# Patient Record
Sex: Female | Born: 1995
Health system: Southern US, Community
[De-identification: ages and names within clinical notes are randomized; demographics above are authoritative.]

## PROBLEM LIST (undated history)

## (undated) ENCOUNTER — Inpatient Hospital Stay (HOSPITAL_COMMUNITY): Payer: Self-pay

## (undated) DIAGNOSIS — Z5189 Encounter for other specified aftercare: Secondary | ICD-10-CM

## (undated) DIAGNOSIS — N390 Urinary tract infection, site not specified: Secondary | ICD-10-CM

## (undated) DIAGNOSIS — K219 Gastro-esophageal reflux disease without esophagitis: Secondary | ICD-10-CM

## (undated) DIAGNOSIS — I10 Essential (primary) hypertension: Secondary | ICD-10-CM

## (undated) DIAGNOSIS — O139 Gestational [pregnancy-induced] hypertension without significant proteinuria, unspecified trimester: Secondary | ICD-10-CM

## (undated) DIAGNOSIS — D649 Anemia, unspecified: Secondary | ICD-10-CM

## (undated) DIAGNOSIS — R7989 Other specified abnormal findings of blood chemistry: Secondary | ICD-10-CM

## (undated) HISTORY — DX: Gastro-esophageal reflux disease without esophagitis: K21.9

## (undated) HISTORY — PX: DILATION AND CURETTAGE OF UTERUS: SHX78

## (undated) HISTORY — DX: Essential (primary) hypertension: I10

## (undated) HISTORY — DX: Other specified abnormal findings of blood chemistry: R79.89

## (undated) HISTORY — PX: NO PAST SURGERIES: SHX2092

## (undated) HISTORY — DX: Anemia, unspecified: D64.9

---
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
## 2017-06-19 IMAGING — DX DG KNEE COMPLETE 4+V*L*
1 series · 1 of 1 positions shown · non-contrast
Comparison: None.

CLINICAL DATA: Cheerleading injury with lateral joint pain, initial
encounter

EXAM:
LEFT KNEE - COMPLETE 4+ VIEW

[knee lat]
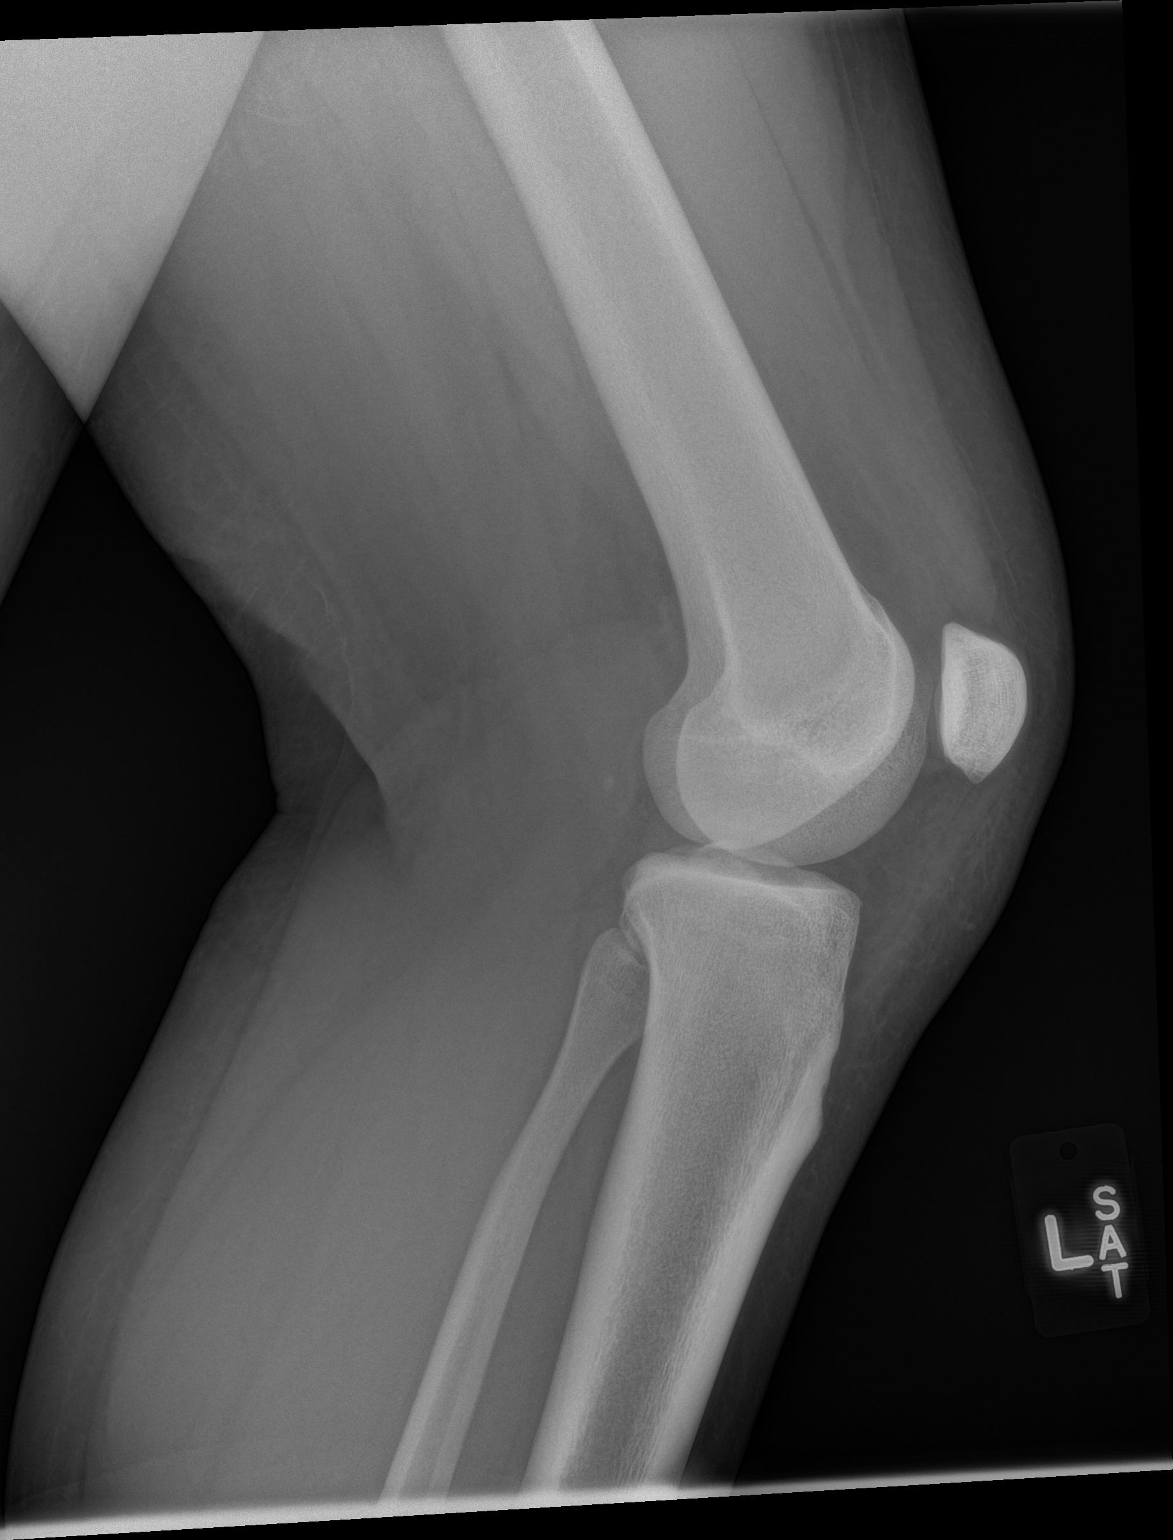

[1 of 1 positions shown; findings below may reference images not displayed]

FINDINGS: No acute fracture or dislocation is noted. Soft tissue swelling is
noted as well as a small joint effusion.
IMPRESSION: Soft tissue injury without acute bony abnormality.

## 2017-10-20 ENCOUNTER — Encounter: Payer: Self-pay | Admitting: Obstetrics

## 2017-10-20 ENCOUNTER — Ambulatory Visit (INDEPENDENT_AMBULATORY_CARE_PROVIDER_SITE_OTHER): Payer: BLUE CROSS/BLUE SHIELD | Admitting: Obstetrics

## 2017-10-20 ENCOUNTER — Other Ambulatory Visit (HOSPITAL_COMMUNITY)
Admission: RE | Admit: 2017-10-20 | Discharge: 2017-10-20 | Disposition: A | Discharge status: Home / Self Care | Payer: BLUE CROSS/BLUE SHIELD | Source: Ambulatory Visit | Attending: Obstetrics | Admitting: Obstetrics

## 2017-10-20 VITALS — BP 148/101 | HR 84 | Ht 63.0 in | Wt 231.0 lb

## 2017-10-20 DIAGNOSIS — Z3009 Encounter for other general counseling and advice on contraception: Secondary | ICD-10-CM

## 2017-10-20 DIAGNOSIS — Z113 Encounter for screening for infections with a predominantly sexual mode of transmission: Secondary | ICD-10-CM

## 2017-10-20 DIAGNOSIS — B373 Candidiasis of vulva and vagina: Secondary | ICD-10-CM | POA: Diagnosis not present

## 2017-10-20 DIAGNOSIS — Z6841 Body Mass Index (BMI) 40.0 and over, adult: Secondary | ICD-10-CM

## 2017-10-20 DIAGNOSIS — I10 Essential (primary) hypertension: Secondary | ICD-10-CM

## 2017-10-20 NOTE — Progress Notes (Signed)
LMP:09/28/17 Mammogram:N/A due to age   Last Pap:N/A due to age  Colonoscopy:N/A due to age   Contraception:Nexplanon  [facility]  STD Screening: Declines  CC: heavy bleeding w/nexplanon.

## 2017-10-21 ENCOUNTER — Encounter: Payer: Self-pay | Admitting: Obstetrics

## 2017-10-21 LAB — CERVICOVAGINAL ANCILLARY ONLY
Bacterial vaginitis: NEGATIVE
Candida vaginitis: POSITIVE — AB
Chlamydia: NEGATIVE
Neisseria Gonorrhea: NEGATIVE
Trichomonas: NEGATIVE

## 2017-10-21 NOTE — Progress Notes (Signed)
Subjective:    Bonnie Long is a 22 y.o. female who presents for contraception counseling. The patient has no complaints today. The patient is sexually active. Pertinent past medical history: none.  The information documented in the HPI was reviewed and verified.  Menstrual History: OB History    Gravida Para Term Preterm AB Living   0 0 0 0 0 0   SAB TAB Ectopic Multiple Live Births   0 0 0 0 0       No LMP recorded.   There are no active problems to display for this patient.  History reviewed. No pertinent past medical history.  History reviewed. No pertinent surgical history.   Current Outpatient Medications:  .  etonogestrel (NEXPLANON) 68 MG IMPL implant, 1 each by Subdermal route once., Disp: , Rfl:  No Known Allergies  Social History   Tobacco Use  . Smoking status: Never Smoker  . Smokeless tobacco: Never Used  Substance Use Topics  . Alcohol use: No    Family History  Problem Relation Age of Onset  . Pulmonary embolism Mother   . Hypertension Maternal Grandfather        Review of Systems Constitutional: negative for weight loss Genitourinary:negative for abnormal menstrual periods and vaginal discharge   Objective:   BP (!) 148/101   Pulse 84   Ht 5\' 3"  (1.6 m)   Wt 231 lb (104.8 kg)   BMI 40.92 kg/m    General:   alert  Skin:   no rash or abnormalities  Lungs:   clear to auscultation bilaterally  Heart:   regular rate and rhythm, S1, S2 normal, no murmur, click, rub or gallop  Breasts:   normal without suspicious masses, skin or nipple changes or axillary nodes  Abdomen:  normal findings: no organomegaly, soft, non-tender and no hernia  Pelvis:  External genitalia: normal general appearance Urinary system: urethral meatus normal and bladder without fullness, nontender Vaginal: normal without tenderness, induration or masses Cervix: normal appearance Adnexa: normal bimanual exam Uterus: anteverted and non-tender, normal size   Lab  Review Urine pregnancy test Labs reviewed yes Radiologic studies reviewed no  50% of 15 min visit spent on counseling and coordination of care.    Assessment:    [age] y.o., continuing Nexplanon, no contraindications.  Must get BP under control.  1. HTN (hypertension), benign Rx: - Ambulatory referral to Internal Medicine  2. Class 3 severe obesity due to excess calories without serious comorbidity with body mass index (BMI) of 40.0 to 44.9 in adult [facility](HCC) Rx: - Ambulatory referral to Internal Medicine  3. Screening for STD (sexually transmitted disease) Rx: - Cervicovaginal ancillary only  Plan:    All questions answered. Contraception: Nexplanon. Diagnosis explained in detail, including differential. Discussed healthy lifestyle modifications. Follow up in 3 months.   No orders of the defined types were placed in this encounter.  Orders Placed This Encounter  Procedures  . Ambulatory referral to Internal Medicine    Referral Priority:   Routine    Referral Type:   Consultation    Referral Reason:   Specialty Services Required    Requested Specialty:   Internal Medicine    Number of Visits Requested:   1   Need to obtain previous records Follow up as needed.    Brock BadHARLES A. Crewe Heathman MD

## 2017-10-22 ENCOUNTER — Other Ambulatory Visit: Payer: Self-pay | Admitting: Obstetrics

## 2017-10-22 DIAGNOSIS — B373 Candidiasis of vulva and vagina: Secondary | ICD-10-CM

## 2017-10-22 DIAGNOSIS — B3731 Acute candidiasis of vulva and vagina: Secondary | ICD-10-CM

## 2017-10-22 MED ORDER — FLUCONAZOLE 150 MG PO TABS: 150.0000 mg | ORAL_TABLET | Freq: Once | ORAL | 0 refills | Status: AC

## 2018-01-14 ENCOUNTER — Other Ambulatory Visit: Payer: Self-pay | Admitting: Obstetrics

## 2018-01-14 DIAGNOSIS — N3000 Acute cystitis without hematuria: Secondary | ICD-10-CM

## 2018-01-14 MED ORDER — NITROFURANTOIN MONOHYD MACRO 100 MG PO CAPS: 100.0000 mg | ORAL_CAPSULE | Freq: Two times a day (BID) | ORAL | 0 refills | Status: DC

## 2018-01-21 ENCOUNTER — Ambulatory Visit (INDEPENDENT_AMBULATORY_CARE_PROVIDER_SITE_OTHER): Payer: BLUE CROSS/BLUE SHIELD | Admitting: Nurse Practitioner

## 2018-01-21 ENCOUNTER — Other Ambulatory Visit (INDEPENDENT_AMBULATORY_CARE_PROVIDER_SITE_OTHER): Payer: BLUE CROSS/BLUE SHIELD

## 2018-01-21 ENCOUNTER — Encounter: Payer: Self-pay | Admitting: Nurse Practitioner

## 2018-01-21 ENCOUNTER — Encounter

## 2018-01-21 VITALS — BP 142/98 | HR 74 | Temp 99.5°F | Resp 16 | Ht 63.0 in | Wt 223.0 lb

## 2018-01-21 DIAGNOSIS — E669 Obesity, unspecified: Secondary | ICD-10-CM | POA: Insufficient documentation

## 2018-01-21 DIAGNOSIS — R030 Elevated blood-pressure reading, without diagnosis of hypertension: Secondary | ICD-10-CM

## 2018-01-21 DIAGNOSIS — Z0001 Encounter for general adult medical examination with abnormal findings: Secondary | ICD-10-CM | POA: Insufficient documentation

## 2018-01-21 DIAGNOSIS — Z0000 Encounter for general adult medical examination without abnormal findings: Secondary | ICD-10-CM | POA: Insufficient documentation

## 2018-01-21 LAB — BASIC METABOLIC PANEL
BUN: 10 mg/dL (ref 6–23)
CALCIUM: 9.3 mg/dL (ref 8.4–10.5)
CO2: 28 meq/L (ref 19–32)
Chloride: 104 mEq/L (ref 96–112)
Creatinine, Ser: 1.03 mg/dL (ref 0.40–1.20)
GFR: 87.59 mL/min (ref >60.00)
GLUCOSE: 86 mg/dL (ref 70–99)
POTASSIUM: 3.9 meq/L (ref 3.5–5.1)
SODIUM: 137 meq/L (ref 135–145)

## 2018-01-21 LAB — CBC
HCT: 39.4 % (ref 36.0–46.0)
HEMOGLOBIN: 13.1 g/dL (ref 12.0–15.0)
MCHC: 33.4 g/dL (ref 30.0–36.0)
MCV: 82.4 fl (ref 78.0–100.0)
PLATELETS: 336 10*3/uL (ref 150.0–400.0)
RBC: 4.78 Mil/uL (ref 3.87–5.11)
RDW: 13.9 % (ref 11.5–14.6)
WBC: 6.2 10*3/uL (ref 4.5–10.5)

## 2018-01-21 LAB — LIPID PANEL
CHOL/HDL RATIO: 4
Cholesterol: 146 mg/dL (ref 0–200)
HDL: 38.8 mg/dL — AB (ref >39.00)
LDL Cholesterol: 86 mg/dL (ref 0–99)
NONHDL: 107.58
Triglycerides: 106 mg/dL (ref 0.0–149.0)
VLDL: 21.2 mg/dL (ref 0.0–40.0)

## 2018-01-21 LAB — TSH: TSH: 1.07 u[IU]/mL (ref 0.35–5.50)

## 2018-01-21 LAB — HEMOGLOBIN A1C: Hgb A1c MFr Bld: 5.5 % (ref 4.6–6.5)

## 2018-01-21 NOTE — Assessment & Plan Note (Signed)
-  USPSTF grade A and B recommendations reviewed with patient; age-appropriate recommendations, preventive care, screening tests, etc discussed and encouraged; healthy living encouraged; see AVS for patient education given to patient -Discussed importance of 150 minutes of physical activity weekly, eat one serving of tree nuts ( cashews, pistachios, pecans, almonds.Marland Kitchen.) every other day, eat 6 servings of fruit/vegetables daily and drink plenty of water and avoid sweet beverages. Always wear sunscreen when outside > 10 minutes.  -Reviewed Health Maintenance: declines HIV screening, otherwise up to date

## 2018-01-21 NOTE — Progress Notes (Signed)
Name: Bonnie Long   MRN: [id number]    DOB: September 27, 1996   Date:01/21/2018       Progress Note  Subjective  Chief Complaint  Chief Complaint  Patient presents with  . Establish Care    CPE, not fasting    HPI  Patient presents for annual CPE. She is accompanied by her grandmother today. She is in school studying nursing  Diet: poor diet - skips meals, mostly eats meals out Exercise: no routine exercise  USPSTF grade A and B recommendations  Depression:  Depression screen [facility] 2/9 01/21/2018  Decreased Interest 0  Down, Depressed, Hopeless 0  PHQ - 2 Score 0   Hypertension: She was told by her GYN recently that her blood pressure was high and asked to establish with PCP for further evaluation She denies past history of HTN She checks her blood pressure about twice a week and gets readings of 120s/80s She feels like her blood pressure is going up because she is coming to the clinic and she is nervous BP Readings from Last 3 Encounters:  01/21/18 (!) 142/98  10/20/17 (!) 148/101  05/21/15 126/90   Obesity: Wt Readings from Last 3 Encounters:  01/21/18 223 lb (101.2 kg)  10/20/17 231 lb (104.8 kg)  03/26/12 193 lb 4 oz (87.7 kg) (98 %, Z= 2.17)*   * Growth percentiles are based on CDC (Girls, 2-20 Years) data.   BMI Readings from Last 3 Encounters:  01/21/18 39.50 kg/m  10/20/17 40.92 kg/m  03/26/12 30.27 kg/m (97 %, Z= 1.92)*   * Growth percentiles are based on CDC (Girls, 2-20 Years) data.    Alcohol: no  Tobacco use: no, never HIV: declines screening STD testing and prevention (chl/gon/syphilis): declines, no concerns Intimate partner violence:feels safe at home Menstrual History/Abnormal Bleeding: follows GYN for womens healthcare needs, no concerns today Incontinence Symptoms:denies symptoms  Vaccinations: up to date  Advanced Care Planning: A voluntary discussion about advance care planning including the explanation and discussion of advance  directives.  Discussed health care proxy and Living will, and the patient DOES NOT have a living will at present time. If patient does have living will, I have requested they bring this to the clinic to be scanned in to their chart.  Lipids:  No results found for: CHOL No results found for: HDL No results found for: LDLCALC No results found for: TRIG No results found for: CHOLHDL No results found for: LDLDIRECT  Glucose:  No results found for: GLUCOSE, GLUCAP  Skin cancer screening: no concerning lesions or moles, no routine sunscreen use  Aspirin: not indicated ECG: not indicated   Family History  Problem Relation Age of Onset  . Pulmonary embolism Mother   . Hypertension Maternal Grandfather     Social History   Socioeconomic History  . Marital status: Single    Spouse name: Not on file  . Number of children: Not on file  . Years of education: Not on file  . Highest education level: Not on file  Occupational History  . Not on file  Social Needs  . Financial resource strain: Not on file  . Food insecurity:    Worry: Not on file    Inability: Not on file  . Transportation needs:    Medical: Not on file    Non-medical: Not on file  Tobacco Use  . Smoking status: Never Smoker  . Smokeless tobacco: Never Used  Substance and Sexual Activity  . Alcohol use: No  .  Drug use: No  . Sexual activity: Not Currently    Partners: Male    Birth control/protection: Implant  Lifestyle  . Physical activity:    Days per week: Not on file    Minutes per session: Not on file  . Stress: Not on file  Relationships  . Social connections:    Talks on phone: Not on file    Gets together: Not on file    Attends religious service: Not on file    Active member of club or organization: Not on file    Attends meetings of clubs or organizations: Not on file    Relationship status: Not on file  . Intimate partner violence:    Fear of current or ex partner: Not on file     Emotionally abused: Not on file    Physically abused: Not on file    Forced sexual activity: Not on file  Other Topics Concern  . Not on file  Social History Narrative  . Not on file     Current Outpatient Medications:  .  etonogestrel (NEXPLANON) 68 MG IMPL implant, 1 each by Subdermal route once., Disp: , Rfl:   No Known Allergies   ROS  Constitutional: Negative for fever or weight change.  Respiratory: Negative for cough and shortness of breath.   Cardiovascular: Negative for chest pain or palpitations.  Gastrointestinal: Negative for abdominal pain, no bowel changes.  Musculoskeletal: Negative for gait problem or joint swelling.  Skin: Negative for rash.  Neurological: Negative for dizziness or headache.  No other specific complaints in a complete review of systems (except as listed in HPI above).   Objective  Vitals:   01/21/18 1305  BP: (!) 142/98  Pulse: 74  Resp: 16  Temp: 99.5 F (37.5 C)  TempSrc: Oral  SpO2: 98%  Weight: 223 lb (101.2 kg)  Height: 5\' 3"  (1.6 m)    Body mass index is 39.5 kg/m.  Physical Exam Vital signs reviewed. Constitutional: Patient appears well-developed and well-nourished. No distress.  HENT: Head: Normocephalic and atraumatic. Ears: B TMs ok, no erythema or effusion, scant cerumen bilaterally; Nose: Nose normal. Mouth/Throat: Oropharynx is clear and moist. No oropharyngeal exudate.  Eyes: Conjunctivae and EOM are normal. Pupils are equal, round, and reactive to light. No scleral icterus.  Neck: Normal range of motion. Neck supple. No JVD present. No thyromegaly present.  Cardiovascular: Normal rate, regular rhythm and normal heart sounds.  No murmur heard. No BLE edema. Pulmonary/Chest: Effort normal and breath sounds normal. No respiratory distress. Abdominal: Soft. Bowel sounds are normal, no distension. There is no tenderness. no masses Musculoskeletal: Normal range of motion, no joint effusions. No gross  deformities Neurological: he is alert and oriented to person, place, and time. No cranial nerve deficit. Coordination, balance, strength, speech and gait are normal.  Skin: Skin is warm and dry. No rash noted. No erythema.  Psychiatric: Patient has a normal mood and affect. behavior is normal. Judgment and thought content normal.  PHQ2/9: Depression screen PHQ 2/9 01/21/2018  Decreased Interest 0  Down, Depressed, Hopeless 0  PHQ - 2 Score 0   Fall Risk: Fall Risk  01/21/2018  Falls in the past year? No   Assessment & Plan RTC in 2 weeks for F/U: HTN  Obesity (BMI 30-39.9) Discussed the role of healthy diet and exercise in the management of weight and encouarged patient to make active lifestyle changes to prevent development of chronic health problems - Hemoglobin A1c; Future - Lipid  panel; Future - TSH; Future  Elevated blood pressure reading Elevated reading in the clinic with reported normal readings at home Instructed to keep BP log at home and RTC in 2 weeks for F/U Discussed the role of healthy diet and exercise in the management of hypertension and encouraged patient to make active lifestyle changes to prevent development of chronic health problems - Lipid panel; Future - CBC; Future - Basic metabolic panel; Future - TSH; Future

## 2018-01-21 NOTE — Patient Instructions (Signed)
Please try to check your blood pressure once daily or at least a few times a week, at the same time each day, and keep a log. Please return in about 2 weeks with your blood pressure log. Our goal is to keep your blood pressure below 140/90.  Please head downstairs for lab work/x-rays. If any of your test results are critically abnormal, you will be contacted right away. Your results may be released to your MyChart for viewing before I am able to provide you with my response. I will contact you within a week about your test results and any recommendations for abnormalities.  Please work on your diet and exercise as we discussed. Remember half of your plate should be veggies, one-fourth carbs, one-fourth meat, and don't eat meat at every meal. Also, remember to stay away from sugary drinks. I'd like for you to start incorporating exercise into your daily schedule. Start at 10 minutes a day, working up to 30 minutes five times a week.   I will see you in 2 weeks!  Health Maintenance, Female Adopting a healthy lifestyle and getting preventive care can go a long way to promote health and wellness. Talk with your health care provider about what schedule of regular examinations is right for you. This is a good chance for you to check in with your provider about disease prevention and staying healthy. In between checkups, there are plenty of things you can do on your own. Experts have done a lot of research about which lifestyle changes and preventive measures are most likely to keep you healthy. Ask your health care provider for more information. Weight and diet Eat a healthy diet  Be sure to include plenty of vegetables, fruits, low-fat dairy products, and lean protein.  Do not eat a lot of foods high in solid fats, added sugars, or salt.  Get regular exercise. This is one of the most important things you can do for your health. ? Most adults should exercise for at least 150 minutes each week. The  exercise should increase your heart rate and make you sweat (moderate-intensity exercise). ? Most adults should also do strengthening exercises at least twice a week. This is in addition to the moderate-intensity exercise.  Maintain a healthy weight  Body mass index (BMI) is a measurement that can be used to identify possible weight problems. It estimates body fat based on height and weight. Your health care provider can help determine your BMI and help you achieve or maintain a healthy weight.  For females 75 years of age and older: ? A BMI below 18.5 is considered underweight. ? A BMI of 18.5 to 24.9 is normal. ? A BMI of 25 to 29.9 is considered overweight. ? A BMI of 30 and above is considered obese.  Watch levels of cholesterol and blood lipids  You should start having your blood tested for lipids and cholesterol at 20 years of age, then have this test every 5 years.  You may need to have your cholesterol levels checked more often if: ? Your lipid or cholesterol levels are high. ? You are older than 20 years of age. ? You are at high risk for heart disease.  Cancer screening Lung Cancer  Lung cancer screening is recommended for adults 72-66 years old who are at high risk for lung cancer because of a history of smoking.  A yearly low-dose CT scan of the lungs is recommended for people who: ? Currently smoke. ? Have quit within  the past 15 years. ? Have at least a 30-pack-year history of smoking. A pack year is smoking an average of one pack of cigarettes a day for 1 year.  Yearly screening should continue until it has been 15 years since you quit.  Yearly screening should stop if you develop a health problem that would prevent you from having lung cancer treatment.  Breast Cancer  Practice breast self-awareness. This means understanding how your breasts normally appear and feel.  It also means doing regular breast self-exams. Let your health care provider know about any  changes, no matter how small.  If you are in your 20s or 30s, you should have a clinical breast exam (CBE) by a health care provider every 1-3 years as part of a regular health exam.  If you are 75 or older, have a CBE every year. Also consider having a breast X-ray (mammogram) every year.  If you have a family history of breast cancer, talk to your health care provider about genetic screening.  If you are at high risk for breast cancer, talk to your health care provider about having an MRI and a mammogram every year.  Breast cancer gene (BRCA) assessment is recommended for women who have family members with BRCA-related cancers. BRCA-related cancers include: ? Breast. ? Ovarian. ? Tubal. ? Peritoneal cancers.  Results of the assessment will determine the need for genetic counseling and BRCA1 and BRCA2 testing.  Cervical Cancer Your health care provider may recommend that you be screened regularly for cancer of the pelvic organs (ovaries, uterus, and vagina). This screening involves a pelvic examination, including checking for microscopic changes to the surface of your cervix (Pap test). You may be encouraged to have this screening done every 3 years, beginning at age 24.  For women ages 40-65, health care providers may recommend pelvic exams and Pap testing every 3 years, or they may recommend the Pap and pelvic exam, combined with testing for human papilloma virus (HPV), every 5 years. Some types of HPV increase your risk of cervical cancer. Testing for HPV may also be done on women of any age with unclear Pap test results.  Other health care providers may not recommend any screening for nonpregnant women who are considered low risk for pelvic cancer and who do not have symptoms. Ask your health care provider if a screening pelvic exam is right for you.  If you have had past treatment for cervical cancer or a condition that could lead to cancer, you need Pap tests and screening for cancer  for at least 20 years after your treatment. If Pap tests have been discontinued, your risk factors (such as having a new sexual partner) need to be reassessed to determine if screening should resume. Some women have medical problems that increase the chance of getting cervical cancer. In these cases, your health care provider may recommend more frequent screening and Pap tests.  Colorectal Cancer  This type of cancer can be detected and often prevented.  Routine colorectal cancer screening usually begins at 20 years of age and continues through 20 years of age.  Your health care provider may recommend screening at an earlier age if you have risk factors for colon cancer.  Your health care provider may also recommend using home test kits to check for hidden blood in the stool.  A small camera at the end of a tube can be used to examine your colon directly (sigmoidoscopy or colonoscopy). This is done to check for  the earliest forms of colorectal cancer.  Routine screening usually begins at age 32.  Direct examination of the colon should be repeated every 5-10 years through 20 years of age. However, you may need to be screened more often if early forms of precancerous polyps or small growths are found.  Skin Cancer  Check your skin from head to toe regularly.  Tell your health care provider about any new moles or changes in moles, especially if there is a change in a mole's shape or color.  Also tell your health care provider if you have a mole that is larger than the size of a pencil eraser.  Always use sunscreen. Apply sunscreen liberally and repeatedly throughout the day.  Protect yourself by wearing long sleeves, pants, a wide-brimmed hat, and sunglasses whenever you are outside.  Heart disease, diabetes, and high blood pressure  High blood pressure causes heart disease and increases the risk of stroke. High blood pressure is more likely to develop in: ? People who have blood  pressure in the high end of the normal range (130-139/85-89 mm Hg). ? People who are overweight or obese. ? People who are African American.  If you are 17-32 years of age, have your blood pressure checked every 3-5 years. If you are 13 years of age or older, have your blood pressure checked every year. You should have your blood pressure measured twice-once when you are at a hospital or clinic, and once when you are not at a hospital or clinic. Record the average of the two measurements. To check your blood pressure when you are not at a hospital or clinic, you can use: ? An automated blood pressure machine at a pharmacy. ? A home blood pressure monitor.  If you are between 81 years and 29 years old, ask your health care provider if you should take aspirin to prevent strokes.  Have regular diabetes screenings. This involves taking a blood sample to check your fasting blood sugar level. ? If you are at a normal weight and have a low risk for diabetes, have this test once every three years after 20 years of age. ? If you are overweight and have a high risk for diabetes, consider being tested at a younger age or more often. Preventing infection Hepatitis B  If you have a higher risk for hepatitis B, you should be screened for this virus. You are considered at high risk for hepatitis B if: ? You were born in a country where hepatitis B is common. Ask your health care provider which countries are considered high risk. ? Your parents were born in a high-risk country, and you have not been immunized against hepatitis B (hepatitis B vaccine). ? You have HIV or AIDS. ? You use needles to inject street drugs. ? You live with someone who has hepatitis B. ? You have had sex with someone who has hepatitis B. ? You get hemodialysis treatment. ? You take certain medicines for conditions, including cancer, organ transplantation, and autoimmune conditions.  Hepatitis C  Blood testing is recommended  for: ? Everyone born from [birth year] through 1963. ? Anyone with known risk factors for hepatitis C.  Sexually transmitted infections (STIs)  You should be screened for sexually transmitted infections (STIs) including gonorrhea and chlamydia if: ? You are sexually active and are younger than 20 years of age. ? You are older than 20 years of age and your health care provider tells you that you are at risk for this  type of infection. ? Your sexual activity has changed since you were last screened and you are at an increased risk for chlamydia or gonorrhea. Ask your health care provider if you are at risk.  If you do not have HIV, but are at risk, it may be recommended that you take a prescription medicine daily to prevent HIV infection. This is called pre-exposure prophylaxis (PrEP). You are considered at risk if: ? You are sexually active and do not regularly use condoms or know the HIV status of your partner(s). ? You take drugs by injection. ? You are sexually active with a partner who has HIV.  Talk with your health care provider about whether you are at high risk of being infected with HIV. If you choose to begin PrEP, you should first be tested for HIV. You should then be tested every 3 months for as long as you are taking PrEP. Pregnancy  If you are premenopausal and you may become pregnant, ask your health care provider about preconception counseling.  If you may become pregnant, take 400 to 800 micrograms (mcg) of folic acid every day.  If you want to prevent pregnancy, talk to your health care provider about birth control (contraception). Osteoporosis and menopause  Osteoporosis is a disease in which the bones lose minerals and strength with aging. This can result in serious bone fractures. Your risk for osteoporosis can be identified using a bone density scan.  If you are 4 years of age or older, or if you are at risk for osteoporosis and fractures, ask your health care provider if  you should be screened.  Ask your health care provider whether you should take a calcium or vitamin D supplement to lower your risk for osteoporosis.  Menopause may have certain physical symptoms and risks.  Hormone replacement therapy may reduce some of these symptoms and risks. Talk to your health care provider about whether hormone replacement therapy is right for you. Follow these instructions at home:  Schedule regular health, dental, and eye exams.  Stay current with your immunizations.  Do not use any tobacco products including cigarettes, chewing tobacco, or electronic cigarettes.  If you are pregnant, do not drink alcohol.  If you are breastfeeding, limit how much and how often you drink alcohol.  Limit alcohol intake to no more than 1 drink per day for nonpregnant women. One drink equals 12 ounces of beer, 5 ounces of wine, or 1 ounces of hard liquor.  Do not use street drugs.  Do not share needles.  Ask your health care provider for help if you need support or information about quitting drugs.  Tell your health care provider if you often feel depressed.  Tell your health care provider if you have ever been abused or do not feel safe at home. This information is not intended to replace advice given to you by your health care provider. Make sure you discuss any questions you have with your health care provider. Document Released: 04/01/2011 Document Revised: 02/22/2016 Document Reviewed: 06/20/2015 Elsevier Interactive Patient Education  [employer].

## 2018-02-11 ENCOUNTER — Ambulatory Visit (INDEPENDENT_AMBULATORY_CARE_PROVIDER_SITE_OTHER): Payer: BLUE CROSS/BLUE SHIELD | Admitting: Nurse Practitioner

## 2018-02-11 ENCOUNTER — Encounter: Payer: Self-pay | Admitting: Nurse Practitioner

## 2018-02-11 VITALS — BP 142/90 | HR 70 | Resp 16 | Ht 63.0 in | Wt 222.4 lb

## 2018-02-11 DIAGNOSIS — I10 Essential (primary) hypertension: Secondary | ICD-10-CM | POA: Diagnosis not present

## 2018-02-11 DIAGNOSIS — E669 Obesity, unspecified: Secondary | ICD-10-CM | POA: Diagnosis not present

## 2018-02-11 MED ORDER — AMLODIPINE BESYLATE 5 MG PO TABS: 5.0000 mg | ORAL_TABLET | Freq: Every day | ORAL | 1 refills | Status: DC

## 2018-02-11 NOTE — Assessment & Plan Note (Addendum)
BP readings elevated on 2 OV and per home log today We discussed starting medication to treat HTN and she Is agreeable, will start amlodipine- dosing and side effects discussed Continue to monitor BP readings at home She is very young and I encouraged her to continue healthy diet and exercise to improve BP and promote life long health RTC in 2 weeks for F/U - amLODipine (NORVASC) 5 MG tablet; Take 1 tablet (5 mg total) by mouth daily.  Dispense: 30 tablet; Refill: 1

## 2018-02-11 NOTE — Progress Notes (Signed)
Name: Bonnie Long   MRN: [id number]    DOB: May 26, 1996   Date:02/11/2018       Progress Note  Subjective  Chief Complaint  Chief Complaint  Patient presents with  . Follow-up    blood pressure    HPI Here today with family member for a BP follow up. She established with me for CPE on 4/24 and her BP was elevated. We discussed working on healthy diet and exercise and keeping BP log at home with plan to f/u in about 2 weeks.  She has been keeping BP log as instructed- daily home readings since our last visit have ranged from 130s-200s/90s. She started exercising about 2 weeks ago, running on treadmill and elliptical. She has not really changed her diet much, but has been trying to cook at home some. Denies headaches, vision changes, chest pain, shortness of breath, edema. Overall she feels well and does not have any complaints  BP Readings from Last 3 Encounters:  02/11/18 (!) 142/90  01/21/18 (!) 142/98  10/20/17 (!) 148/101    Patient Active Problem List   Diagnosis Date Noted  . Encounter for general adult medical examination with abnormal findings 01/21/2018  . Obesity (BMI 30-39.9) 01/21/2018    No past surgical history on file.  Family History  Problem Relation Age of Onset  . Pulmonary embolism Mother   . Hypertension Maternal Grandfather     Social History   Socioeconomic History  . Marital status: Single    Spouse name: Not on file  . Number of children: Not on file  . Years of education: Not on file  . Highest education level: Not on file  Occupational History  . Not on file  Social Needs  . Financial resource strain: Not on file  . Food insecurity:    Worry: Not on file    Inability: Not on file  . Transportation needs:    Medical: Not on file    Non-medical: Not on file  Tobacco Use  . Smoking status: Never Smoker  . Smokeless tobacco: Never Used  Substance and Sexual Activity  . Alcohol use: No  . Drug use: No  . Sexual activity: Not  Currently    Partners: Male    Birth control/protection: Implant  Lifestyle  . Physical activity:    Days per week: Not on file    Minutes per session: Not on file  . Stress: Not on file  Relationships  . Social connections:    Talks on phone: Not on file    Gets together: Not on file    Attends religious service: Not on file    Active member of club or organization: Not on file    Attends meetings of clubs or organizations: Not on file    Relationship status: Not on file  . Intimate partner violence:    Fear of current or ex partner: Not on file    Emotionally abused: Not on file    Physically abused: Not on file    Forced sexual activity: Not on file  Other Topics Concern  . Not on file  Social History Narrative  . Not on file     Current Outpatient Medications:  .  etonogestrel (NEXPLANON) 68 MG IMPL implant, 1 each by Subdermal route once., Disp: , Rfl:   No Known Allergies   ROS See HPI  Objective  Vitals:   02/11/18 1530  BP: (!) 142/90  Pulse: 70  Resp: 16  SpO2: 99%  Weight: 222 lb 6.4 oz (100.9 kg)  Height:  (1.6 m)    Body mass index is 39.4 kg/m.  Physical Exam Vital signs reviewed. Constitutional: Patient appears well-developed and well-nourished. No distress.  HENT: Head: Normocephalic and atraumatic. Nose: Nose normal. Mouth/Throat: Oropharynx is clear and moist. No oropharyngeal exudate.  Eyes: Conjunctivae and EOM are normal. Pupils are equal, round, and reactive to light. No scleral icterus.  Neck: Normal range of motion. Neck supple.  Cardiovascular: Normal rate, regular rhythm and normal heart sounds.  No murmur heard. No BLE edema. Pulmonary/Chest: Effort normal and breath sounds normal. No respiratory distress. Neurological: She is alert and oriented to person, place, and time. Coordination, balance, strength, speech and gait are normal.  Skin: Skin is warm and dry. No rash noted. No erythema.  Psychiatric: Patient has a normal  mood and affect. behavior is normal. Judgment and thought content normal.  Assessment & Plan RTC in 2 weeks for F/U: HTN- starting amlodipine

## 2018-02-11 NOTE — Assessment & Plan Note (Addendum)
Discussed the role of healthy diet and exercise in the management of obesity and hypertension, she is interested in weight management. Will place referral today and also provided educational information on AVS - Amb Ref to Medical Weight Management

## 2018-02-11 NOTE — Patient Instructions (Signed)
I have sent a prescription for amlodipine 5 mg once daily for your blood pressure. Please try to check your blood pressure once daily or at least a few times a week, at the same time each day, and keep a log. Our goal is to get your blood pressure below 140/90. Bring your blood pressure monitor with you to next visit and we can make sure its accurate.  I have placed a referral to weight managment. Our office will call you to schedule this appointment. You should hear from our office in 7-10 days.  please return in about 2 weeks    Mediterranean Diet A Mediterranean diet refers to food and lifestyle choices that are based on the traditions of countries located on the [employer] [place]. This way of eating has been shown to help prevent certain conditions and improve outcomes for people who have chronic diseases, like kidney disease and heart disease. What are tips for following this plan? Lifestyle  Cook and eat meals together with your family, when possible.  Drink enough fluid to keep your urine clear or pale yellow.  Be physically active every day. This includes: ? Aerobic exercise like running or swimming. ? Leisure activities like gardening, walking, or housework.  Get 7-8 hours of sleep each night.  If recommended by your health care provider, drink red wine in moderation. This means 1 glass a day for nonpregnant women and 2 glasses a day for men. A glass of wine equals 5 oz (150 mL). Reading food labels  Check the serving size of packaged foods. For foods such as rice and pasta, the serving size refers to the amount of cooked product, not dry.  Check the total fat in packaged foods. Avoid foods that have saturated fat or trans fats.  Check the ingredients list for added sugars, such as corn syrup. Shopping  At the grocery store, buy most of your food from the areas near the walls of the store. This includes: ? Fresh fruits and vegetables (produce). ? Grains, beans, nuts, and  seeds. Some of these may be available in unpackaged forms or large amounts (in bulk). ? Fresh seafood. ? Poultry and eggs. ? Low-fat dairy products.  Buy whole ingredients instead of prepackaged foods.  Buy fresh fruits and vegetables in-season from local farmers markets.  Buy frozen fruits and vegetables in resealable bags.  If you do not have access to quality fresh seafood, buy precooked frozen shrimp or canned fish, such as tuna, salmon, or sardines.  Buy small amounts of raw or cooked vegetables, salads, or olives from the deli or salad bar at your store.  Stock your pantry so you always have certain foods on hand, such as olive oil, canned tuna, canned tomatoes, rice, pasta, and beans. Cooking  Cook foods with extra-virgin olive oil instead of using butter or other vegetable oils.  Have meat as a side dish, and have vegetables or grains as your main dish. This means having meat in small portions or adding small amounts of meat to foods like pasta or stew.  Use beans or vegetables instead of meat in common dishes like chili or lasagna.  Experiment with different cooking methods. Try roasting or broiling vegetables instead of steaming or sauteing them.  Add frozen vegetables to soups, stews, pasta, or rice.  Add nuts or seeds for added healthy fat at each meal. You can add these to yogurt, salads, or vegetable dishes.  Marinate fish or vegetables using olive oil, lemon juice, garlic, and  fresh herbs. Meal planning  Plan to eat 1 vegetarian meal one day each week. Try to work up to 2 vegetarian meals, if possible.  Eat seafood 2 or more times a week.  Have healthy snacks readily available, such as: ? Vegetable sticks with hummus. ? [place] yogurt. ? Fruit and nut trail mix.  Eat balanced meals throughout the week. This includes: ? Fruit: 2-3 servings a day ? Vegetables: 4-5 servings a day ? Low-fat dairy: 2 servings a day ? Fish, poultry, or lean meat: 1 serving a  day ? Beans and legumes: 2 or more servings a week ? Nuts and seeds: 1-2 servings a day ? Whole grains: 6-8 servings a day ? Extra-virgin olive oil: 3-4 servings a day  Limit red meat and sweets to only a few servings a month What are my food choices?  Mediterranean diet ? Recommended ? Grains: Whole-grain pasta. Brown rice. Bulgar wheat. Polenta. Couscous. Whole-wheat bread. [place]. ? Vegetables: Artichokes. Beets. Broccoli. Cabbage. Carrots. Eggplant. Green beans. Chard. Kale. Spinach. Onions. Leeks. Peas. Squash. Tomatoes. Peppers. Radishes. ? Fruits: Apples. Apricots. Avocado. Berries. Bananas. Cherries. Dates. Figs. Grapes. Lemons. Melon. Oranges. Peaches. Plums. Pomegranate. ? Meats and other protein foods: Beans. Almonds. Sunflower seeds. Pine nuts. Peanuts. [place]. Salmon. Scallops. Shrimp. [place]. Tilapia. Clams. Oysters. Eggs. ? Dairy: Low-fat milk. Cheese. Greek yogurt. ? Beverages: Water. Red wine. Herbal tea. ? Fats and oils: Extra virgin olive oil. Avocado oil. Grape seed oil. ? Sweets and desserts: [place] yogurt with honey. Baked apples. Poached pears. Trail mix. ? Seasoning and other foods: Basil. Cilantro. Coriander. Cumin. Mint. Parsley. Sage. Rosemary. Tarragon. Garlic. Oregano. Thyme. Pepper. Balsalmic vinegar. Tahini. Hummus. Tomato sauce. Olives. Mushrooms. ? Limit these ? Grains: Prepackaged pasta or rice dishes. Prepackaged cereal with added sugar. ? Vegetables: Deep fried potatoes (french fries). ? Fruits: Fruit canned in syrup. ? Meats and other protein foods: Beef. Pork. Lamb. Poultry with skin. Hot dogs. [place] Salines. ? Dairy: Ice cream. Sour cream. Whole milk. ? Beverages: Juice. Sugar-sweetened soft drinks. Beer. Liquor and spirits. ? Fats and oils: Butter. Canola oil. Vegetable oil. Beef fat (tallow). Lard. ? Sweets and desserts: Cookies. Cakes. Pies. Candy. ? Seasoning and other foods: Mayonnaise. Premade sauces and marinades. ? The items listed may not be a  complete list. Talk with your dietitian about what dietary choices are right for you. Summary  The Mediterranean diet includes both food and lifestyle choices.  Eat a variety of fresh fruits and vegetables, beans, nuts, seeds, and whole grains.  Limit the amount of red meat and sweets that you eat.  Talk with your health care provider about whether it is safe for you to drink red wine in moderation. This means 1 glass a day for nonpregnant women and 2 glasses a day for men. A glass of wine equals 5 oz (150 mL). This information is not intended to replace advice given to you by your health care provider. Make sure you discuss any questions you have with your health care provider. Document Released: 05/09/2016 Document Revised: 06/11/2016 Document Reviewed: 05/09/2016 Elsevier Interactive Patient Education  [employer].

## 2018-02-26 ENCOUNTER — Encounter: Payer: Self-pay | Admitting: Nurse Practitioner

## 2018-02-26 ENCOUNTER — Ambulatory Visit (INDEPENDENT_AMBULATORY_CARE_PROVIDER_SITE_OTHER): Payer: BLUE CROSS/BLUE SHIELD | Admitting: Nurse Practitioner

## 2018-02-26 DIAGNOSIS — I10 Essential (primary) hypertension: Secondary | ICD-10-CM

## 2018-02-26 MED ORDER — AMLODIPINE BESYLATE 10 MG PO TABS: 10.0000 mg | ORAL_TABLET | Freq: Every day | ORAL | 1 refills | Status: DC

## 2018-02-26 NOTE — Progress Notes (Signed)
Name: Bonnie Long   MRN: [id number]    DOB: 1995-11-17   Date:02/26/2018       Progress Note  Subjective  Chief Complaint  Chief Complaint  Patient presents with  . Follow-up    blood pressure    HPI  Hypertension -started on amlodipine 5 daily at last OV due to repeated elevated BP readings. We have also spent time discussing healthy diet and exercise at last 2 OV. She tells me today that she has been taking the amlodipine daily as prescribed without any noted adverse effects. She has also been trying to exercise three times a week and cook more meals at home instead of eating out Reports she has been checking at home almost daily with recent readings upper 130s/80s Denies headaches, vision changes, chest pain, shortness of breath, edema.  BP Readings from Last 3 Encounters:  02/26/18 (!) 140/96  02/11/18 (!) 142/90  01/21/18 (!) 142/98    Patient Active Problem List   Diagnosis Date Noted  . Hypertension 02/11/2018  . Encounter for general adult medical examination with abnormal findings 01/21/2018  . Obesity (BMI 30-39.9) 01/21/2018    No past surgical history on file.  Family History  Problem Relation Age of Onset  . Pulmonary embolism Mother   . Hypertension Maternal Grandfather     Social History   Socioeconomic History  . Marital status: Single    Spouse name: Not on file  . Number of children: Not on file  . Years of education: Not on file  . Highest education level: Not on file  Occupational History  . Not on file  Social Needs  . Financial resource strain: Not on file  . Food insecurity:    Worry: Not on file    Inability: Not on file  . Transportation needs:    Medical: Not on file    Non-medical: Not on file  Tobacco Use  . Smoking status: Never Smoker  . Smokeless tobacco: Never Used  Substance and Sexual Activity  . Alcohol use: No  . Drug use: No  . Sexual activity: Not Currently    Partners: Male    Birth control/protection:  Implant  Lifestyle  . Physical activity:    Days per week: Not on file    Minutes per session: Not on file  . Stress: Not on file  Relationships  . Social connections:    Talks on phone: Not on file    Gets together: Not on file    Attends religious service: Not on file    Active member of club or organization: Not on file    Attends meetings of clubs or organizations: Not on file    Relationship status: Not on file  . Intimate partner violence:    Fear of current or ex partner: Not on file    Emotionally abused: Not on file    Physically abused: Not on file    Forced sexual activity: Not on file  Other Topics Concern  . Not on file  Social History Narrative  . Not on file     Current Outpatient Medications:  .  amLODipine (NORVASC) 5 MG tablet, Take 1 tablet (5 mg total) by mouth daily., Disp: 30 tablet, Rfl: 1 .  etonogestrel (NEXPLANON) 68 MG IMPL implant, 1 each by Subdermal route once., Disp: , Rfl:   No Known Allergies   ROS See HPI  Objective  Vitals:   02/26/18 1110  BP: (!) 140/96  Pulse: 78  Resp: 16  Temp: 99.4 F (37.4 C)  TempSrc: Oral  SpO2: 98%  Weight: 216 lb (98 kg)  Height:  (1.6 m)    Body mass index is 38.26 kg/m.  Physical Exam Vital signs reviewed. Constitutional: Patient appears well-developed and well-nourished. No distress.  HENT: Head: Normocephalic and atraumatic. Nose: Nose normal. Mouth/Throat: Oropharynx is clear and moist. No oropharyngeal exudate.  Eyes: Conjunctivae and EOM are normal. Pupils are equal, round, and reactive to light. No scleral icterus.  Neck: Normal range of motion. Neck supple.  Cardiovascular: Normal rate, regular rhythm and normal heart sounds. No murmur heard.No BLE edema. Distal pulses intact Pulmonary/Chest: Effort normal and breath sounds normal. No respiratory distress. Neurological: She is alert and oriented to person, place, and time. Coordination, balance, strength, speech and gait are  normal.  Skin: Skin is warm and dry. No rash noted. No erythema.  Psychiatric: Patient has a normal mood and affect. behavior is normal. Judgment and thought content normal.   Assessment & Plan RTC in 2 weeks for F/U: HTN- increasing amlodipine dosage

## 2018-02-26 NOTE — Assessment & Plan Note (Addendum)
BP remains elevated in clinic today and slightly elevated on home readings She seems to be tolerating amlodipine well so we will increase her dosage of amlodipine today, she is of child bearing age which we will have to keep in consideration going forward with blood pressure management if BP is not controlled with amlodipine, she was also instructed to notify provider if she intends to become pregnant RTC in 2 weeks for F/U - amLODipine (NORVASC) 10 MG tablet; Take 1 tablet (10 mg total) by mouth daily.  Dispense: 30 tablet; Refill: 1

## 2018-02-26 NOTE — Patient Instructions (Addendum)
Please increase your amlodipine to  once daily.  Please try to check your blood pressure once daily or at least a few times a week, at the same time each day, and keep a log.  Please return in about 2 weeks for follow up.  Good job on the hard work to exercise and eat healthy!

## 2018-03-18 ENCOUNTER — Ambulatory Visit: Payer: BLUE CROSS/BLUE SHIELD | Admitting: Nurse Practitioner

## 2018-03-18 DIAGNOSIS — Z0289 Encounter for other administrative examinations: Secondary | ICD-10-CM

## 2018-06-16 ENCOUNTER — Ambulatory Visit: Payer: BLUE CROSS/BLUE SHIELD | Admitting: Family

## 2018-06-17 ENCOUNTER — Other Ambulatory Visit: Payer: Medicaid Other

## 2018-06-17 ENCOUNTER — Encounter: Payer: Self-pay | Admitting: Family

## 2018-06-17 ENCOUNTER — Ambulatory Visit (INDEPENDENT_AMBULATORY_CARE_PROVIDER_SITE_OTHER): Payer: Self-pay | Admitting: Family

## 2018-06-17 VITALS — BP 142/88 | HR 78 | Temp 98.5°F | Ht 63.0 in | Wt 217.1 lb

## 2018-06-17 DIAGNOSIS — I10 Essential (primary) hypertension: Secondary | ICD-10-CM

## 2018-06-17 DIAGNOSIS — R1011 Right upper quadrant pain: Secondary | ICD-10-CM

## 2018-06-17 DIAGNOSIS — R109 Unspecified abdominal pain: Secondary | ICD-10-CM

## 2018-06-17 LAB — POC URINALSYSI DIPSTICK (AUTOMATED)
Bilirubin, UA: 1
Glucose, UA: NEGATIVE
KETONES UA: NEGATIVE
Leukocytes, UA: NEGATIVE
Nitrite, UA: NEGATIVE
PROTEIN UA: POSITIVE — AB
SPEC GRAV UA: 1.02 (ref 1.010–1.025)
UROBILINOGEN UA: 2 U/dL — AB
pH, UA: 6 (ref 5.0–8.0)

## 2018-06-17 MED ORDER — SULFAMETHOXAZOLE-TRIMETHOPRIM 800-160 MG PO TABS: 1.0000 | ORAL_TABLET | Freq: Two times a day (BID) | ORAL | 0 refills | Status: DC

## 2018-06-17 NOTE — Progress Notes (Signed)
Bonnie Long is a 22 y.o. female with the following history as recorded in EpicCare:  Patient Active Problem List   Diagnosis Date Noted  . Hypertension 02/11/2018  . Encounter for general adult medical examination with abnormal findings 01/21/2018  . Obesity (BMI 30-39.9) 01/21/2018    Current Outpatient Medications  Medication Sig Dispense Refill  . amLODipine (NORVASC) 10 MG tablet Take 1 tablet (10 mg total) by mouth daily. 30 tablet 1  . etonogestrel (NEXPLANON) 68 MG IMPL implant 1 each by Subdermal route once.    . sulfamethoxazole-trimethoprim (BACTRIM DS,SEPTRA DS) 800-160 MG tablet Take 1 tablet by mouth 2 (two) times daily. 10 tablet 0   No current facility-administered medications for this visit.     Allergies: Patient has no known allergies.  History reviewed. No pertinent past medical history.  History reviewed. No pertinent surgical history.  Family History  Problem Relation Age of Onset  . Pulmonary embolism Mother   . Hypertension Maternal Grandfather     Social History   Tobacco Use  . Smoking status: Never Smoker  . Smokeless tobacco: Never Used  Substance Use Topics  . Alcohol use: No    Subjective:  Right sided flank pain started suddenly on Saturday; denies any known injury or trauma; no heavy lifting; did run a fever on Saturday at same time that fever started; fever broke Saturday night and then recurred on Sunday evening; has not taken any Tylenol or Motrin since Monday; denies any urinary changes; no nausea or vomiting; LMP- now; no prior history of kidney stones; notes that today is the best she has felt since Saturday evening;  Hypertension was diagnosed earlier this year at GYN office; noted to be extremely high at OV there in January; does have FH of hypertension in both parents, grandparents but no one had blood pressure in their 3220s; mother passed away due to complications of PE;   Objective:  Vitals:   06/17/18 1458  BP: (!) 142/88   Pulse: 78  Temp: 98.5 F (36.9 C)  TempSrc: Oral  SpO2: 98%  Weight: 217 lb 1.3 oz (98.5 kg)  Height: 5\' 3"  (1.6 m)    General: Well developed, well nourished, in no acute distress  Skin : Warm and dry.  Head: Normocephalic and atraumatic  Lungs: Respirations unlabored; clear to auscultation bilaterally without wheeze, rales, rhonchi  CVS exam: normal rate and regular rhythm.  Abdomen: Soft; nontender; nondistended; normoactive bowel sounds; no masses or hepatosplenomegaly  Musculoskeletal: No deformities; no active joint inflammation; negative CVA tenderness Neurologic: Alert and oriented; speech intact; face symmetrical; moves all extremities well; CNII-XII intact without focal deficit   Assessment:  1. Flank pain   2. Right upper quadrant abdominal pain   3. Hypertension, unspecified type     Plan:  1. & 2. ? Kidney stone due to presentation of symptoms and amount of blood noted on U/A today; check urine culture; Rx for Bactrim DS bid x 5 days; will update renal CT; 3. Due to young age and elevated pressure, will check renal vascular ultrasound to rule out stenosis; patient needs to keep working on weight loss/ healthy eating goals.   No follow-ups on file.  Orders Placed This Encounter  Procedures  . Urine Culture    Standing Status:   Future    Standing Expiration Date:   06/17/2019  . CT RENAL STONE STUDY    Standing Status:   Future    Standing Expiration Date:   09/17/2019  Order Specific Question:   Is patient pregnant?    Answer:   No    Order Specific Question:   Preferred imaging location?    Answer:   [place] [facility] - [facility]    Order Specific Question:   Radiology Contrast Protocol - do NOT remove file path    Answer:   [email or web address]  . POCT Urinalysis Dipstick (Automated)    Requested Prescriptions   Signed Prescriptions Disp Refills  . sulfamethoxazole-trimethoprim (BACTRIM DS,SEPTRA DS) 800-160 MG tablet 10 tablet 0     Sig: Take 1 tablet by mouth 2 (two) times daily.

## 2018-06-19 ENCOUNTER — Telehealth: Payer: Self-pay | Admitting: Nurse Practitioner

## 2018-06-19 LAB — URINE CULTURE
MICRO NUMBER: 91120306
SPECIMEN QUALITY: ADEQUATE

## 2018-06-19 NOTE — Telephone Encounter (Signed)
Copied from CRM [phone]. Topic: Quick Communication - See Telephone Encounter >> Jun 19, 2018  8:31 AM Luanna Coleawoud, Jessica L wrote: CRM for notification. See Telephone encounter for: 06/19/18.pt grandmother called and stated that she would like a call back from the nurse because she would like to know if we found something in patient urine culture because they also scheduled a ct exam. Pt does not have insurance. Pt grandmother would like to know if its necessary. Please advise

## 2018-06-19 NOTE — Telephone Encounter (Signed)
Yes, the culture did show an infection; final results aren't back yet though. I was waiting on that to make sure we have the correct antibiotic. If she is feeling better with the antibiotic, we can hold the CT. I understand the concern due to costs at this point.

## 2018-06-19 NOTE — Telephone Encounter (Signed)
Pt's grandmother, Lanora Manislizabeth informed of below.

## 2018-06-19 NOTE — Telephone Encounter (Addendum)
I called pt's grandmother and informed her of below. She states the patient is feeling better and tolerated the Bactrim well. She wants to hold off on CT at this time. She mentioned a u/s also. Is this needed?

## 2018-06-19 NOTE — Telephone Encounter (Signed)
The ultrasound of her kidneys is not emergent; it would be good to get this done in the next few months if possible to make sure there is not another source for her extremely high blood pressure. They can do this once she has insurance back in place.

## 2018-06-22 ENCOUNTER — Inpatient Hospital Stay: Admission: RE | Admit: 2018-06-22 | Payer: Medicaid Other | Source: Ambulatory Visit

## 2018-07-03 ENCOUNTER — Inpatient Hospital Stay (HOSPITAL_COMMUNITY): Admission: RE | Admit: 2018-07-03 | Payer: Medicaid Other | Source: Ambulatory Visit

## 2018-07-03 ENCOUNTER — Encounter (HOSPITAL_COMMUNITY): Payer: Self-pay

## 2019-02-15 ENCOUNTER — Ambulatory Visit (INDEPENDENT_AMBULATORY_CARE_PROVIDER_SITE_OTHER): Payer: Medicaid Other | Admitting: Obstetrics & Gynecology

## 2019-02-15 ENCOUNTER — Encounter: Payer: Self-pay | Admitting: Obstetrics & Gynecology

## 2019-02-15 ENCOUNTER — Other Ambulatory Visit: Payer: Self-pay

## 2019-02-15 VITALS — BP 160/121 | HR 72 | Wt 227.0 lb

## 2019-02-15 DIAGNOSIS — Z3046 Encounter for surveillance of implantable subdermal contraceptive: Secondary | ICD-10-CM | POA: Diagnosis not present

## 2019-02-15 DIAGNOSIS — Z3202 Encounter for pregnancy test, result negative: Secondary | ICD-10-CM | POA: Diagnosis not present

## 2019-02-15 DIAGNOSIS — Z30017 Encounter for initial prescription of implantable subdermal contraceptive: Secondary | ICD-10-CM

## 2019-02-15 LAB — POCT URINE PREGNANCY: Preg Test, Ur: NEGATIVE

## 2019-02-15 MED ORDER — ETONOGESTREL 68 MG ~~LOC~~ IMPL: 68.0000 mg | DRUG_IMPLANT | Freq: Once | SUBCUTANEOUS | Status: DC

## 2019-02-15 MED ORDER — ETONOGESTREL 68 MG ~~LOC~~ IMPL
68.0000 mg | DRUG_IMPLANT | Freq: Once | SUBCUTANEOUS | Status: AC
  Administered 2019-02-15: 10:00:00 68 mg via SUBCUTANEOUS

## 2019-02-15 NOTE — Addendum Note (Signed)
Addended by: Dalphine Handing on: 02/15/2019 09:43 AM   Modules accepted: Orders

## 2019-02-15 NOTE — Patient Instructions (Signed)
Please use back up contraception for one week  Nexplanon Instructions After Insertion   Keep bandage clean and dry for 24 hours   May use ice/Tylenol/Ibuprofen for soreness or pain   If you develop fever, drainage or increased warmth from incision site-contact office immediately

## 2019-02-15 NOTE — Progress Notes (Signed)
     GYNECOLOGY OFFICE PROCEDURE NOTE  Bonnie Long is a 23 y.o. G0P0000 here for Nexplanon removal and Nexplanon insertion.  Last annual exam was last year.  No other gynecologic concerns. Patient is very hypertensive today, will follow up with PCP for management.  Nexplanon Removal and Insertion  Patient identified, informed consent performed, consent signed.   Patient does understand that irregular bleeding is a very common side effect of this medication. She was advised to have backup contraception for one week after replacement of the implant. Pregnancy test in clinic today was negative.  Appropriate time out taken. Implanon site identified. Area prepped in usual sterile fashon. One ml of 1% lidocaine was used to anesthetize the area at the distal end of the implant. A small stab incision was made right beside the implant on the distal portion. The Nexplanon rod was grasped using hemostats and removed without difficulty. There was minimal blood loss. There were no complications. Area was then injected with 3 ml of 1 % lidocaine. She was re-prepped with betadine, Nexplanon removed from packaging, Device confirmed in needle, then inserted full length of needle and withdrawn per handbook instructions. Nexplanon was able to palpated in the patient's arm; patient palpated the insert herself.  There was minimal blood loss. Patient insertion site covered with guaze and a pressure bandage to reduce any bruising. The patient tolerated the procedure well and was given post procedure instructions.  She was advised to have backup contraception for one week, and use condoms for STI prevention.     Jaynie Collins, MD, FACOG Obstetrician & Gynecologist, [facility] for [employer], [facility]

## 2019-02-15 NOTE — Progress Notes (Signed)
RGYN patient presents for Nexplanon Removal and Re-insertion. Inserted May 2017 in [place]. Left Arm  B/P elevated pt denies any HA's or visual changes , no swelling.  Pt no longer on B/P Medications *Pt acknowledges hypertension.   UPT : NEGATIVE     Pt denies any unprotected intercourse in last 14 days.

## 2019-04-14 ENCOUNTER — Telehealth: Payer: Self-pay | Admitting: Family

## 2019-04-14 NOTE — Telephone Encounter (Signed)
Patients mother called requesting TB test with results before Friday.  Unable to get patient in this late in the day for a TB test.  Mother wanted to know if a x ray could be ordered and done this week?

## 2019-04-14 NOTE — Telephone Encounter (Signed)
FYI  I have scheduled patient to be seen by Dr. Jenny Reichmann this week.

## 2019-04-15 ENCOUNTER — Other Ambulatory Visit (INDEPENDENT_AMBULATORY_CARE_PROVIDER_SITE_OTHER): Payer: Self-pay

## 2019-04-15 ENCOUNTER — Ambulatory Visit (INDEPENDENT_AMBULATORY_CARE_PROVIDER_SITE_OTHER)
Admission: RE | Admit: 2019-04-15 | Discharge: 2019-04-15 | Disposition: A | Discharge status: Home / Self Care | Payer: Medicaid Other | Source: Ambulatory Visit | Attending: Internal Medicine | Admitting: Internal Medicine

## 2019-04-15 ENCOUNTER — Encounter: Payer: Self-pay | Admitting: Internal Medicine

## 2019-04-15 ENCOUNTER — Other Ambulatory Visit: Payer: Self-pay

## 2019-04-15 ENCOUNTER — Ambulatory Visit (INDEPENDENT_AMBULATORY_CARE_PROVIDER_SITE_OTHER): Payer: Self-pay | Admitting: Internal Medicine

## 2019-04-15 ENCOUNTER — Other Ambulatory Visit: Payer: Self-pay | Admitting: Internal Medicine

## 2019-04-15 VITALS — BP 126/86 | HR 75 | Temp 98.7°F | Ht 63.0 in | Wt 227.0 lb

## 2019-04-15 DIAGNOSIS — E559 Vitamin D deficiency, unspecified: Secondary | ICD-10-CM

## 2019-04-15 DIAGNOSIS — Z0000 Encounter for general adult medical examination without abnormal findings: Secondary | ICD-10-CM

## 2019-04-15 DIAGNOSIS — R739 Hyperglycemia, unspecified: Secondary | ICD-10-CM

## 2019-04-15 DIAGNOSIS — E538 Deficiency of other specified B group vitamins: Secondary | ICD-10-CM

## 2019-04-15 DIAGNOSIS — Z0289 Encounter for other administrative examinations: Secondary | ICD-10-CM

## 2019-04-15 DIAGNOSIS — I10 Essential (primary) hypertension: Secondary | ICD-10-CM

## 2019-04-15 DIAGNOSIS — E611 Iron deficiency: Secondary | ICD-10-CM

## 2019-04-15 LAB — HEPATIC FUNCTION PANEL
ALT: 11 U/L (ref 0–35)
AST: 13 U/L (ref 0–37)
Albumin: 4.3 g/dL (ref 3.5–5.2)
Alkaline Phosphatase: 50 U/L (ref 39–117)
Bilirubin, Direct: 0.1 mg/dL (ref 0.0–0.3)
Total Bilirubin: 0.7 mg/dL (ref 0.2–1.2)
Total Protein: 7.5 g/dL (ref 6.0–8.3)

## 2019-04-15 LAB — CBC WITH DIFFERENTIAL/PLATELET
Basophils Absolute: 0 10*3/uL (ref 0.0–0.1)
Basophils Relative: 0.4 % (ref 0.0–3.0)
Eosinophils Absolute: 0.1 10*3/uL (ref 0.0–0.7)
Eosinophils Relative: 0.7 % (ref 0.0–5.0)
HCT: 42.7 % (ref 36.0–46.0)
Hemoglobin: 14.1 g/dL (ref 12.0–15.0)
Lymphocytes Relative: 26.3 % (ref 12.0–46.0)
Lymphs Abs: 1.9 10*3/uL (ref 0.7–4.0)
MCHC: 33 g/dL (ref 30.0–36.0)
MCV: 83.6 fl (ref 78.0–100.0)
Monocytes Absolute: 0.6 10*3/uL (ref 0.1–1.0)
Monocytes Relative: 8.6 % (ref 3.0–12.0)
Neutro Abs: 4.6 10*3/uL (ref 1.4–7.7)
Neutrophils Relative %: 64 % (ref 43.0–77.0)
Platelets: 266 10*3/uL (ref 150.0–400.0)
RBC: 5.11 Mil/uL (ref 3.87–5.11)
RDW: 12.9 % (ref 11.5–15.5)
WBC: 7.3 10*3/uL (ref 4.0–10.5)

## 2019-04-15 LAB — URINALYSIS, ROUTINE W REFLEX MICROSCOPIC
Bilirubin Urine: NEGATIVE
Hgb urine dipstick: NEGATIVE
Ketones, ur: NEGATIVE
Leukocytes,Ua: NEGATIVE
Nitrite: NEGATIVE
Specific Gravity, Urine: 1.015 (ref 1.000–1.030)
Total Protein, Urine: NEGATIVE
Urine Glucose: NEGATIVE
Urobilinogen, UA: 1 (ref 0.0–1.0)
pH: 7 (ref 5.0–8.0)

## 2019-04-15 LAB — BASIC METABOLIC PANEL
BUN: 13 mg/dL (ref 6–23)
CO2: 28 mEq/L (ref 19–32)
Calcium: 9.1 mg/dL (ref 8.4–10.5)
Chloride: 102 mEq/L (ref 96–112)
Creatinine, Ser: 1.07 mg/dL (ref 0.40–1.20)
GFR: 77.93 mL/min (ref >60.00)
Glucose, Bld: 96 mg/dL (ref 70–99)
Potassium: 4.4 mEq/L (ref 3.5–5.1)
Sodium: 137 mEq/L (ref 135–145)

## 2019-04-15 LAB — HEMOGLOBIN A1C: Hgb A1c MFr Bld: 5.3 % (ref 4.6–6.5)

## 2019-04-15 LAB — LIPID PANEL
Cholesterol: 144 mg/dL (ref 0–200)
HDL: 45.3 mg/dL (ref >39.00)
LDL Cholesterol: 71 mg/dL (ref 0–99)
NonHDL: 98.36
Total CHOL/HDL Ratio: 3
Triglycerides: 138 mg/dL (ref 0.0–149.0)
VLDL: 27.6 mg/dL (ref 0.0–40.0)

## 2019-04-15 LAB — TSH: TSH: 3.1 u[IU]/mL (ref 0.35–4.50)

## 2019-04-15 LAB — IBC PANEL
Iron: 82 ug/dL (ref 42–145)
Saturation Ratios: 18.9 % — ABNORMAL LOW (ref 20.0–50.0)
Transferrin: 310 mg/dL (ref 212.0–360.0)

## 2019-04-15 LAB — VITAMIN D 25 HYDROXY (VIT D DEFICIENCY, FRACTURES): VITD: 12.59 ng/mL — ABNORMAL LOW (ref 30.00–100.00)

## 2019-04-15 LAB — VITAMIN B12: Vitamin B-12: 249 pg/mL (ref 211–911)

## 2019-04-15 MED ORDER — VITAMIN D (ERGOCALCIFEROL) 1.25 MG (50000 UNIT) PO CAPS: 50000.0000 [IU] | ORAL_CAPSULE | ORAL | 0 refills | Status: DC

## 2019-04-15 NOTE — Progress Notes (Signed)
Subjective:    Patient ID: Bonnie Long, female    DOB: 1996/08/21, 21 y.o.   MRN: [id number]  HPI  Here for wellness and f/u;  Overall doing ok;  Pt denies Chest pain, worsening SOB, DOE, wheezing, orthopnea, PND, worsening LE edema, palpitations, dizziness or syncope.  Pt denies neurological change such as new headache, facial or extremity weakness.  Pt denies polydipsia, polyuria, or low sugar symptoms. Pt states overall good compliance with treatment and medications, good tolerability, and has been trying to follow appropriate diet.  Pt denies worsening depressive symptoms, suicidal ideation or panic. No fever, night sweats, wt loss, loss of appetite, or other constitutional symptoms.  Pt states good ability with ADL's, has low fall risk, home safety reviewed and adequate, no other significant changes in hearing or vision, and only occasionally active with exercise.  Has not taken BP med for over 1 mo Wt Readings from Last 3 Encounters:  04/15/19 227 lb (103 kg)  02/15/19 227 lb (103 kg)  06/17/18 217 lb 1.3 oz (98.5 kg)  Needs TB f/u cxr for employment purpose Past Medical History:  Diagnosis Date  . Hypertension    No past surgical history on file.  reports that she has never smoked. She has never used smokeless tobacco. She reports that she does not drink alcohol or use drugs. family history includes Hypertension in her maternal grandfather; Pulmonary embolism in her mother. No Known Allergies Current Outpatient Medications on File Prior to Visit  Medication Sig Dispense Refill  . etonogestrel (NEXPLANON) 68 MG IMPL implant 1 each by Subdermal route once.     No current facility-administered medications on file prior to visit.    Review of Systems Constitutional: Negative for other unusual diaphoresis, sweats, appetite or weight changes HENT: Negative for other worsening hearing loss, ear pain, facial swelling, mouth sores or neck stiffness.   Eyes: Negative for other worsening  pain, redness or other visual disturbance.  Respiratory: Negative for other stridor or swelling Cardiovascular: Negative for other palpitations or other chest pain  Gastrointestinal: Negative for worsening diarrhea or loose stools, blood in stool, distention or other pain Genitourinary: Negative for hematuria, flank pain or other change in urine volume.  Musculoskeletal: Negative for myalgias or other joint swelling.  Skin: Negative for other color change, or other wound or worsening drainage.  Neurological: Negative for other syncope or numbness. Hematological: Negative for other adenopathy or swelling Psychiatric/Behavioral: Negative for hallucinations, other worsening agitation, SI, self-injury, or new decreased concentration\ All other system neg per pt    Objective:   Physical Exam BP 126/86   Pulse 75   Temp 98.7 F (37.1 C) (Oral)   Ht 5\' 3"  (1.6 m)   Wt 227 lb (103 kg)   SpO2 99%   BMI 40.21 kg/m  VS noted,  Constitutional: Pt is oriented to person, place, and time. Appears well-developed and well-nourished, in no significant distress and comfortable Head: Normocephalic and atraumatic  Eyes: Conjunctivae and EOM are normal. Pupils are equal, round, and reactive to light Right Ear: External ear normal without discharge Left Ear: External ear normal without discharge Nose: Nose without discharge or deformity Mouth/Throat: Oropharynx is without other ulcerations and moist  Neck: Normal range of motion. Neck supple. No JVD present. No tracheal deviation present or significant neck LA or mass Cardiovascular: Normal rate, regular rhythm, normal heart sounds and intact distal pulses.   Pulmonary/Chest: WOB normal and breath sounds without rales or wheezing  Abdominal: Soft. Bowel  sounds are normal. NT. No HSM  Musculoskeletal: Normal range of motion. Exhibits no edema Lymphadenopathy: Has no other cervical adenopathy.  Neurological: Pt is alert and oriented to person, place, and  time. Pt has normal reflexes. No cranial nerve deficit. Motor grossly intact, Gait intact Skin: Skin is warm and dry. No rash noted or new ulcerations Psychiatric:  Has normal mood and affect. Behavior is normal without agitation No other exam findings Lab Results  Component Value Date   WBC 7.3 04/15/2019   HGB 14.1 04/15/2019   HCT 42.7 04/15/2019   PLT 266.0 04/15/2019   GLUCOSE 96 04/15/2019   CHOL 144 04/15/2019   TRIG 138.0 04/15/2019   HDL 45.30 04/15/2019   LDLCALC 71 04/15/2019   ALT 11 04/15/2019   AST 13 04/15/2019   NA 137 04/15/2019   K 4.4 04/15/2019   CL 102 04/15/2019   CREATININE 1.07 04/15/2019   BUN 13 04/15/2019   CO2 28 04/15/2019   TSH 3.10 04/15/2019   HGBA1C 5.3 04/15/2019      Assessment & Plan:

## 2019-04-15 NOTE — Patient Instructions (Signed)
Please continue all other medications as before, and refills have been done if requested.  Please have the pharmacy call with any other refills you may need.  Please continue your efforts at being more active, low cholesterol diet, and weight control.  You are otherwise up to date with prevention measures today.  Please keep your appointments with your specialists as you may have planned  Please go to the XRAY Department in the Basement (go straight as you get off the elevator) for the x-ray testing  Please go to the LAB in the Basement (turn left off the elevator) for the tests to be done today  You will be contacted by phone if any changes need to be made immediately.  Otherwise, you will receive a letter about your results with an explanation, but please check with MyChart first.  Please remember to sign up for MyChart if you have not done so, as this will be important to you in the future with finding out test results, communicating by private email, and scheduling acute appointments online when needed.  Please return in 1 year for your yearly visit, or sooner if needed 

## 2019-04-15 NOTE — Telephone Encounter (Signed)
Noted  

## 2019-04-18 ENCOUNTER — Encounter: Payer: Self-pay | Admitting: Internal Medicine

## 2019-04-18 NOTE — Assessment & Plan Note (Signed)

## 2019-04-18 NOTE — Assessment & Plan Note (Signed)
To continue off med for now,  to f/u any worsening symptoms or concerns and cont to monitor Bp at home and next visit

## 2019-04-18 NOTE — Assessment & Plan Note (Signed)
For cxr 

## 2019-06-18 ENCOUNTER — Other Ambulatory Visit: Payer: Self-pay

## 2019-06-18 DIAGNOSIS — Z20822 Contact with and (suspected) exposure to covid-19: Secondary | ICD-10-CM

## 2019-06-19 LAB — NOVEL CORONAVIRUS, NAA: SARS-CoV-2, NAA: NOT DETECTED

## 2019-12-22 ENCOUNTER — Telehealth (INDEPENDENT_AMBULATORY_CARE_PROVIDER_SITE_OTHER): Payer: Medicaid Other | Admitting: Obstetrics

## 2019-12-22 ENCOUNTER — Encounter: Payer: Self-pay | Admitting: Obstetrics

## 2019-12-22 DIAGNOSIS — Z3046 Encounter for surveillance of implantable subdermal contraceptive: Secondary | ICD-10-CM | POA: Diagnosis not present

## 2019-12-22 DIAGNOSIS — N939 Abnormal uterine and vaginal bleeding, unspecified: Secondary | ICD-10-CM

## 2019-12-22 NOTE — Progress Notes (Signed)
    GYNECOLOGY VIRTUAL VISIT ENCOUNTER NOTE  Provider location: Center for [facility] [place]   I connected with [name] on 12/22/19 at  4:00 PM EDT by MyChart Video Encounter at home and verified that I am speaking with the correct person using two identifiers.   I discussed the limitations, risks, security and privacy concerns of performing an evaluation and management service virtually and the availability of in person appointments. I also discussed with the patient that there may be a patient responsible charge related to this service. The patient expressed understanding and agreed to proceed.   History:  Bonnie Long is a 24 y.o. G0P0000 female being evaluated today for AUB with Nexplanon.  Wants Nexplanon removed.  She denies any abnormal vaginal discharge, bleeding, pelvic pain or other concerns.       Past Medical History:  Diagnosis Date  . Hypertension    History reviewed. No pertinent surgical history. The following portions of the patient's history were reviewed and updated as appropriate: allergies, current medications, past family history, past medical history, past social history, past surgical history and problem list.   Review of Systems:  Pertinent items noted in HPI and remainder of comprehensive ROS otherwise negative.  Physical Exam:   General:  Alert, oriented and cooperative. Patient appears to be in no acute distress.  Mental Status: Normal mood and affect. Normal behavior. Normal judgment and thought content.   Respiratory: Normal respiratory effort, no problems with respiration noted  Rest of physical exam deferred due to type of encounter  Labs and Imaging No results found for this or any previous visit (from the past 336 hour(s)). No results found.     Assessment and Plan:     1. Encounter for surveillance of Nexplanon subdermal contraceptive  2. Abnormal uterine bleeding (AUB) - wants Nexplanon removed - offered trial of  OCP's to stop AUB and she declines       I discussed the assessment and treatment plan with the patient. The patient was provided an opportunity to ask questions and all were answered. The patient agreed with the plan and demonstrated an understanding of the instructions.   The patient was advised to call back or seek an in-person evaluation/go to the ED if the symptoms worsen or if the condition fails to improve as anticipated.  I provided 15 minutes of face-to-face time during this encounter.   Coral Ceo, MD Center for [facility], [facility] 12/22/2019

## 2020-01-11 ENCOUNTER — Other Ambulatory Visit: Payer: Self-pay

## 2020-01-11 ENCOUNTER — Ambulatory Visit (INDEPENDENT_AMBULATORY_CARE_PROVIDER_SITE_OTHER): Payer: BC Managed Care – PPO | Admitting: Obstetrics

## 2020-01-11 ENCOUNTER — Encounter: Payer: Self-pay | Admitting: Obstetrics

## 2020-01-11 VITALS — BP 159/108 | HR 82 | Wt 236.0 lb

## 2020-01-11 DIAGNOSIS — Z3009 Encounter for other general counseling and advice on contraception: Secondary | ICD-10-CM | POA: Diagnosis not present

## 2020-01-11 DIAGNOSIS — N939 Abnormal uterine and vaginal bleeding, unspecified: Secondary | ICD-10-CM

## 2020-01-11 DIAGNOSIS — Z3046 Encounter for surveillance of implantable subdermal contraceptive: Secondary | ICD-10-CM | POA: Diagnosis not present

## 2020-01-11 NOTE — Progress Notes (Signed)
NEXPLANON REMOVAL NOTE  Date of LMP:   unknown  Contraception used: *Nexplanon   Indications:  The patient desires removal of Nexplanon.  She understands risks, benefits, and alternatives to Implanon and would like to proceed.  Anesthesia:   Lidocaine 1% plain.  Procedure:  A time-out was performed confirming the procedure and the patient's allergy status.  Complications: None                      The rod was palpated and the area was sterilely prepped.  The area beneath the distal tip was anesthetized with 1% xylocaine and the skin incised                       Over the tip and the tip was exposed, grasped with forcep and removed intact.  A single suture of 4-0 Vicryl was used to close incision.  Steri strip                       And a bandage applied and the arm was wrapped with gauze bandage.  The patient tolerated well.  Instructions:  The patient was instructed to remove the dressing in 24 hours and that some bruising is to be expected.  She was advised to use over the counter analgesics as needed for any pain at the site.  She is to keep the area dry for 24 hours and to call if her hand or arm becomes cold, numb, or blue.  Return visit:  Return in 2 weeks   Brock Bad, MD 01/11/2020 4:35 PM

## 2020-01-11 NOTE — Progress Notes (Signed)
Pt is in office for Nexplanon removal. Pt states she has had placed for almost 1 year. Pt states she has had irregular bleeding with use of Nexplanon. Pt does not want any other method at this time.

## 2020-01-25 ENCOUNTER — Encounter: Payer: Self-pay | Admitting: Obstetrics

## 2020-01-25 ENCOUNTER — Other Ambulatory Visit: Payer: Self-pay

## 2020-01-25 ENCOUNTER — Ambulatory Visit (INDEPENDENT_AMBULATORY_CARE_PROVIDER_SITE_OTHER): Payer: BC Managed Care – PPO | Admitting: Obstetrics

## 2020-01-25 VITALS — BP 145/111 | HR 84 | Wt 238.0 lb

## 2020-01-25 DIAGNOSIS — Z3009 Encounter for other general counseling and advice on contraception: Secondary | ICD-10-CM | POA: Diagnosis not present

## 2020-01-25 DIAGNOSIS — Z9889 Other specified postprocedural states: Secondary | ICD-10-CM | POA: Diagnosis not present

## 2020-01-25 NOTE — Progress Notes (Signed)
Pt presents for Follow up Nexplanon removal.  CC: None   Pt states she does not desire any other contraception at this time.

## 2020-01-25 NOTE — Progress Notes (Signed)
Subjective:    Bonnie Long is a 24 y.o. female who presents for post op check after removal of Nexplanon, and contraception counseling. The patient has no complaints today. The patient is sexually active. Pertinent past medical history: hypertension.  The information documented in the HPI was reviewed and verified.  Menstrual History: OB History    Gravida  0   Para  0   Term  0   Preterm  0   AB  0   Living  0     SAB  0   TAB  0   Ectopic  0   Multiple  0   Live Births  0          No LMP recorded.   Patient Active Problem List   Diagnosis Date Noted  . Encounter for physical examination related to employment 04/15/2019  . Hypertension 02/11/2018  . Preventative health care 01/21/2018  . Obesity (BMI 30-39.9) 01/21/2018   Past Medical History:  Diagnosis Date  . Hypertension     History reviewed. No pertinent surgical history.   Current Outpatient Medications:  .  etonogestrel (NEXPLANON) 68 MG IMPL implant, 1 each by Subdermal route once., Disp: , Rfl:  .  Vitamin D, Ergocalciferol, (DRISDOL) 1.25 MG (50000 UT) CAPS capsule, Take 1 capsule (50,000 Units total) by mouth every 7 (seven) days. (Patient not taking: Reported on 01/25/2020), Disp: 12 capsule, Rfl: 0 No Known Allergies  Social History   Tobacco Use  . Smoking status: Never Smoker  . Smokeless tobacco: Never Used  Substance Use Topics  . Alcohol use: No    Family History  Problem Relation Age of Onset  . Pulmonary embolism Mother   . Hypertension Maternal Grandfather        Review of Systems Constitutional: negative for weight loss Genitourinary:negative for abnormal menstrual periods and vaginal discharge   Objective:   BP (!) 145/111   Pulse 84   Wt 238 lb (108 kg)   BMI 42.16 kg/m    General:   alert  Skin:   no rash or abnormalities  Lungs:   clear to auscultation bilaterally  Heart:   regular rate and rhythm, S1, S2 normal, no murmur, click, rub or gallop              Left Upper Extremity:  Removal site clean,                                                         dry and non tender.  Lab Review Urine pregnancy test Labs reviewed yes Radiologic studies reviewed no  50% of 15 min visit spent on counseling and coordination of care.    Assessment:    24 y.o., discontinuing Nexplanon, no contraindications.   Plan:   1. Postoperative state. Two week check-up after Nexplanon Removal - doing well  2. Encounter for other general counseling and advice on contraception - declines contraception    All questions answered. Contraception: none. Discussed healthy lifestyle modifications. Follow up in 3 months.    Brock Bad, MD 01/25/2020 5:10 PM

## 2020-04-10 ENCOUNTER — Encounter: Payer: Self-pay | Admitting: Internal Medicine

## 2020-04-10 ENCOUNTER — Other Ambulatory Visit: Payer: Self-pay

## 2020-04-10 ENCOUNTER — Ambulatory Visit (INDEPENDENT_AMBULATORY_CARE_PROVIDER_SITE_OTHER): Payer: Self-pay | Admitting: Internal Medicine

## 2020-04-10 VITALS — BP 130/86 | HR 82 | Temp 98.3°F | Ht 63.0 in | Wt 240.0 lb

## 2020-04-10 DIAGNOSIS — I10 Essential (primary) hypertension: Secondary | ICD-10-CM

## 2020-04-10 DIAGNOSIS — Z0001 Encounter for general adult medical examination with abnormal findings: Secondary | ICD-10-CM

## 2020-04-10 DIAGNOSIS — G43809 Other migraine, not intractable, without status migrainosus: Secondary | ICD-10-CM

## 2020-04-10 DIAGNOSIS — R739 Hyperglycemia, unspecified: Secondary | ICD-10-CM

## 2020-04-10 DIAGNOSIS — Z114 Encounter for screening for human immunodeficiency virus [HIV]: Secondary | ICD-10-CM

## 2020-04-10 DIAGNOSIS — G43909 Migraine, unspecified, not intractable, without status migrainosus: Secondary | ICD-10-CM | POA: Insufficient documentation

## 2020-04-10 DIAGNOSIS — Z1159 Encounter for screening for other viral diseases: Secondary | ICD-10-CM

## 2020-04-10 DIAGNOSIS — E559 Vitamin D deficiency, unspecified: Secondary | ICD-10-CM | POA: Insufficient documentation

## 2020-04-10 DIAGNOSIS — Z0000 Encounter for general adult medical examination without abnormal findings: Secondary | ICD-10-CM

## 2020-04-10 HISTORY — DX: Migraine, unspecified, not intractable, without status migrainosus: G43.909

## 2020-04-10 MED ORDER — SUMATRIPTAN SUCCINATE 100 MG PO TABS: 100.0000 mg | ORAL_TABLET | ORAL | 5 refills | Status: DC | PRN

## 2020-04-10 NOTE — Progress Notes (Signed)
   Subjective:    Patient ID: Bonnie Long, female    DOB: [birth year], 24 y.o.   MRN: [id number]  HPI  Here for wellness and f/u;  Overall doing ok;  Pt denies Chest pain, worsening SOB, DOE, wheezing, orthopnea, PND, worsening LE edema, palpitations, dizziness or syncope.  Pt denies neurological change such as new headache, facial or extremity weakness.  Pt denies polydipsia, polyuria, or low sugar symptoms. Pt states overall good compliance with treatment and medications, good tolerability, and has been trying to follow appropriate diet.  Pt denies worsening depressive symptoms, suicidal ideation or panic. No fever, night sweats, wt loss, loss of appetite, or other constitutional symptoms.  Pt states good ability with ADL's, has low fall risk, home safety reviewed and adequate, no other significant changes in hearing or vision, and only occasionally active with exercise  Works as Lawyer at [employer].  Declines labs today Wt Readings from Last 3 Encounters:  04/10/20 240 lb (108.9 kg)  01/25/20 238 lb (108 kg)  01/11/20 236 lb (107 kg)  Also has HA and stress with incresae after nexplanon removed from left arm.   Past Medical History:  Diagnosis Date  . Hypertension    History reviewed. No pertinent surgical history.  reports that she has never smoked. She has never used smokeless tobacco. She reports that she does not drink alcohol and does not use drugs. family history includes Hypertension in her maternal grandfather; Pulmonary embolism in her mother. No Known Allergies No current outpatient medications on file prior to visit.   No current facility-administered medications on file prior to visit.   Review of Systems All otherwise neg per pt     Objective:   Physical Exam BP 130/86 (BP Location: Left Arm, Patient Position: Sitting, Cuff Size: Large)   Pulse 82   Temp 98.3 F (36.8 C) (Oral)   Ht 5\' 3"  (1.6 m)   Wt 240 lb (108.9 kg)   SpO2 99%   BMI 42.51 kg/m  VS noted,    Constitutional: Pt appears in NAD HENT: Head: NCAT.  Right Ear: External ear normal.  Left Ear: External ear normal.  Eyes: . Pupils are equal, round, and reactive to light. Conjunctivae and EOM are normal Nose: without d/c or deformity Neck: Neck supple. Gross normal ROM Cardiovascular: Normal rate and regular rhythm.   Pulmonary/Chest: Effort normal and breath sounds without rales or wheezing.  Abd:  Soft, NT, ND, + BS, no organomegaly Neurological: Pt is alert. At baseline orientation, motor grossly intact Skin: Skin is warm. No rashes, other new lesions, no LE edema Psychiatric: Pt behavior is normal without agitation  All otherwise neg per pt Lab Results  Component Value Date   WBC 7.3 04/15/2019   HGB 14.1 04/15/2019   HCT 42.7 04/15/2019   PLT 266.0 04/15/2019   GLUCOSE 96 04/15/2019   CHOL 144 04/15/2019   TRIG 138.0 04/15/2019   HDL 45.30 04/15/2019   LDLCALC 71 04/15/2019   ALT 11 04/15/2019   AST 13 04/15/2019   NA 137 04/15/2019   K 4.4 04/15/2019   CL 102 04/15/2019   CREATININE 1.07 04/15/2019   BUN 13 04/15/2019   CO2 28 04/15/2019   TSH 3.10 04/15/2019   HGBA1C 5.3 04/15/2019      Assessment & Plan:

## 2020-04-10 NOTE — Assessment & Plan Note (Signed)
stable overall by history and exam, recent data reviewed with pt, and pt to continue medical treatment as before,  to f/u any worsening symptoms or concerns  

## 2020-04-10 NOTE — Assessment & Plan Note (Signed)
Ok for oral replacement 

## 2020-04-10 NOTE — Assessment & Plan Note (Signed)

## 2020-04-10 NOTE — Patient Instructions (Signed)
Please take all new medication as prescribed - the imitrex generic as needed for migraines  Please take OTC Vitamin D3 at 2000 units per day, indefinitely.  Please continue all other medications as before, and refills have been done if requested.  Please have the pharmacy call with any other refills you may need.  Please continue your efforts at being more active, low cholesterol diet, and weight control.  You are otherwise up to date with prevention measures today.  Please keep your appointments with your specialists as you may have planned  Please make an Appointment to return for your 1 year visit, or sooner if needed

## 2020-04-10 NOTE — Assessment & Plan Note (Addendum)
Mild to mod, for imitrex prn.  to f/u any worsening symptoms or concerns  I spent 21 minutes in addition to time for CPX wellness examination in preparing to see the patient by review of recent labs, imaging and procedures, obtaining and reviewing separately obtained history, communicating with the patient and family or caregiver, ordering medications, tests or procedures, and documenting clinical information in the EHR including the differential Dx, treatment, and any further evaluation and other management of migraine, htn, vit d deficiency

## 2020-08-04 ENCOUNTER — Other Ambulatory Visit: Payer: Self-pay

## 2020-08-04 ENCOUNTER — Encounter: Payer: Self-pay | Admitting: Advanced Practice Midwife

## 2020-08-04 ENCOUNTER — Ambulatory Visit (INDEPENDENT_AMBULATORY_CARE_PROVIDER_SITE_OTHER): Payer: Self-pay | Admitting: Advanced Practice Midwife

## 2020-08-04 VITALS — BP 182/138 | HR 88 | Ht 63.0 in | Wt 244.0 lb

## 2020-08-04 DIAGNOSIS — Z3169 Encounter for other general counseling and advice on procreation: Secondary | ICD-10-CM

## 2020-08-04 DIAGNOSIS — I10 Essential (primary) hypertension: Secondary | ICD-10-CM

## 2020-08-04 MED ORDER — AMLODIPINE BESYLATE 10 MG PO TABS: 10.0000 mg | ORAL_TABLET | Freq: Every day | ORAL | 1 refills | Status: DC

## 2020-08-04 MED ORDER — AMLODIPINE BESYLATE 10 MG PO TABS
10.0000 mg | ORAL_TABLET | Freq: Every day | ORAL | 1 refills | Status: DC
Start: 2020-08-04 — End: 2020-08-04

## 2020-08-04 NOTE — Patient Instructions (Signed)

## 2020-08-04 NOTE — Progress Notes (Signed)
GYN VISIT Patient name: Bonnie Long MRN [id number]  Date of birth: [birth year] Chief Complaint:   Discuss Fertility ("been trying to conceive for 2 months")  History of Present Illness:   Bonnie Long is a 24 y.o. [place] female being seen today for concern over not becoming pregnant after 2 mos of unprotected, timed IC. She had her Nex removed in April 2021 and has been documenting cycles since July 2021 as averaging q 31 days.   Depression screen [facility] 2/9 08/04/2020 04/10/2020 04/10/2020 04/15/2019 01/21/2018  Decreased Interest 0 0 0 0 0  Down, Depressed, Hopeless 0 0 0 0 0  PHQ - 2 Score 0 0 0 0 0  Altered sleeping 0 - - 0 -  Tired, decreased energy 1 - - 0 -  Change in appetite 0 - - 0 -  Feeling bad or failure about yourself  0 - - 0 -  Trouble concentrating 0 - - 0 -  Moving slowly or fidgety/restless 0 - - 0 -  Suicidal thoughts 0 - - 0 -  PHQ-9 Score 1 - - 0 -    Patient's last menstrual period was 07/24/2020. The current method of family planning is desires pregnancy.  Last pap never.  Review of Systems:   Pertinent items are noted in HPI Denies fever/chills, dizziness, headaches, visual disturbances, fatigue, shortness of breath, chest pain, abdominal pain, vomiting, abnormal vaginal discharge/itching/odor/irritation, problems with periods, bowel movements, urination, or intercourse unless otherwise stated above.  Pertinent History Reviewed:  Reviewed past medical,surgical, social, obstetrical and family history.  Reviewed problem list, medications and allergies. Physical Assessment:   Vitals:   08/04/20 0944 08/04/20 1059  BP: (!) 176/119 (!) 182/138  Pulse: 88   Weight:  244 lb (110.7 kg)  Height: '5\' 3"'  (1.6 m)   Body mass index is 43.22 kg/m.       Physical Examination:   General appearance: alert, well appearing, and in no distress  Mental status: alert, oriented to person, place, and time  Skin: warm & dry   Cardiovascular: normal  heart rate noted  Respiratory: normal respiratory effort, no distress  Abdomen: soft, non-tender   Pelvic: examination not indicated  Extremities: no edema    No results found for this or any previous visit (from the past 24 hour(s)).  Assessment & Plan:  1) Primary hypertension> pt reports taking Norvasc in the past, but then she was told she only had 'white coat syndrome'; today she has severe range BPs; strongly recommended getting blood pressure in normal range prior to conception due to risk of pregnancy/SAB complications; rec purchasing a correctly-sized BP cuff and taking daily BPs with a goal of >140-90   2) Desires to conceive> reviewed that 90% of women will conceive within 1 year of attempting; rec ovulation predictor kit and timed IC once BP is controlled; rec daily prenatal vit  Meds:  Meds ordered this encounter  Medications  . DISCONTD: amLODipine (NORVASC) 10 MG tablet    Sig: Take 1 tablet (10 mg total) by mouth daily.    Dispense:  30 tablet    Refill:  1    Order Specific Question:   Supervising Provider    Answer:   Elonda Husky, LUTHER H [[id number]]  . amLODipine (NORVASC) 10 MG tablet    Sig: Take 1 tablet (10 mg total) by mouth daily.    Dispense:  30 tablet    Refill:  1    No orders of the  defined types were placed in this encounter.   Return for Pap & Physical, prn.  Myrtis Ser CNM 08/04/2020 11:00 AM

## 2020-08-23 ENCOUNTER — Ambulatory Visit: Payer: BC Managed Care – PPO | Admitting: Internal Medicine

## 2020-08-23 ENCOUNTER — Other Ambulatory Visit: Payer: Self-pay

## 2020-08-23 ENCOUNTER — Encounter: Payer: Self-pay | Admitting: Internal Medicine

## 2020-08-23 VITALS — BP 134/78 | HR 90 | Temp 98.3°F | Ht 63.0 in | Wt 243.0 lb

## 2020-08-23 DIAGNOSIS — R109 Unspecified abdominal pain: Secondary | ICD-10-CM

## 2020-08-23 DIAGNOSIS — H2000 Unspecified acute and subacute iridocyclitis: Secondary | ICD-10-CM

## 2020-08-23 LAB — POC URINALSYSI DIPSTICK (AUTOMATED)
Bilirubin, UA: NEGATIVE
Glucose, UA: NEGATIVE
Ketones, UA: NEGATIVE
Leukocytes, UA: NEGATIVE
Nitrite, UA: POSITIVE
Protein, UA: POSITIVE — AB
Spec Grav, UA: 1.025 (ref 1.010–1.025)
Urobilinogen, UA: 1 E.U./dL
pH, UA: 6 (ref 5.0–8.0)

## 2020-08-23 MED ORDER — NITROFURANTOIN MONOHYD MACRO 100 MG PO CAPS: 100.0000 mg | ORAL_CAPSULE | Freq: Two times a day (BID) | ORAL | 0 refills | Status: DC

## 2020-08-23 NOTE — Addendum Note (Signed)
Addended by: Miguel Aschoff on: 08/23/2020 03:58 PM   Modules accepted: Orders

## 2020-08-23 NOTE — Addendum Note (Signed)
Addended by: Karma Ganja on: 08/23/2020 03:52 PM   Modules accepted: Orders

## 2020-08-23 NOTE — Assessment & Plan Note (Signed)
Acute Urine dip consistent with UTI Will send urine for culture Take the antibiotic as prescribed.  Macrobid 100 mg BID x 7 days Take tylenol or advil if needed.   Increase your water intake.  Call if no improvement

## 2020-08-23 NOTE — Patient Instructions (Signed)
Take the antibiotic as prescribed.  Take tylenol if needed.     Increase your water intake.   Call if no improvement     Urinary Tract Infection, Adult A urinary tract infection (UTI) is an infection of any part of the urinary tract, which includes the kidneys, ureters, bladder, and urethra. These organs make, store, and get rid of urine in the body. UTI can be a bladder infection (cystitis) or kidney infection (pyelonephritis). What are the causes? This infection may be caused by fungi, viruses, or bacteria. Bacteria are the most common cause of UTIs. This condition can also be caused by repeated incomplete emptying of the bladder during urination. What increases the risk? This condition is more likely to develop if:  You ignore your need to urinate or hold urine for long periods of time.  You do not empty your bladder completely during urination.  You wipe back to front after urinating or having a bowel movement, if you are female.  You are uncircumcised, if you are female.  You are constipated.  You have a urinary catheter that stays in place (indwelling).  You have a weak defense (immune) system.  You have a medical condition that affects your bowels, kidneys, or bladder.  You have diabetes.  You take antibiotic medicines frequently or for long periods of time, and the antibiotics no longer work well against certain types of infections (antibiotic resistance).  You take medicines that irritate your urinary tract.  You are exposed to chemicals that irritate your urinary tract.  You are female.  What are the signs or symptoms? Symptoms of this condition include:  Fever.  Frequent urination or passing small amounts of urine frequently.  Needing to urinate urgently.  Pain or burning with urination.  Urine that smells bad or unusual.  Cloudy urine.  Pain in the lower abdomen or back.  Trouble urinating.  Blood in the urine.  Vomiting or being less hungry than  normal.  Diarrhea or abdominal pain.  Vaginal discharge, if you are female.  How is this diagnosed? This condition is diagnosed with a medical history and physical exam. You will also need to provide a urine sample to test your urine. Other tests may be done, including:  Blood tests.  Sexually transmitted disease (STD) testing.  If you have had more than one UTI, a cystoscopy or imaging studies may be done to determine the cause of the infections. How is this treated? Treatment for this condition often includes a combination of two or more of the following:  Antibiotic medicine.  Other medicines to treat less common causes of UTI.  Over-the-counter medicines to treat pain.  Drinking enough water to stay hydrated.  Follow these instructions at home:  Take over-the-counter and prescription medicines only as told by your health care provider.  If you were prescribed an antibiotic, take it as told by your health care provider. Do not stop taking the antibiotic even if you start to feel better.  Avoid alcohol, caffeine, tea, and carbonated beverages. They can irritate your bladder.  Drink enough fluid to keep your urine clear or pale yellow.  Keep all follow-up visits as told by your health care provider. This is important.  Make sure to: ? Empty your bladder often and completely. Do not hold urine for long periods of time. ? Empty your bladder before and after sex. ? Wipe from front to back after a bowel movement if you are female. Use each tissue one time when you   wipe. Contact a health care provider if:  You have back pain.  You have a fever.  You feel nauseous or vomit.  Your symptoms do not get better after 3 days.  Your symptoms go away and then return. Get help right away if:  You have severe back pain or lower abdominal pain.  You are vomiting and cannot keep down any medicines or water. This information is not intended to replace advice given to you by  your health care provider. Make sure you discuss any questions you have with your health care provider. Document Released: 06/26/2005 Document Revised: 02/28/2016 Document Reviewed: 08/07/2015 Elsevier Interactive Patient Education  2018 Elsevier Inc[employer]   

## 2020-08-23 NOTE — Progress Notes (Signed)
Subjective:    Patient ID: Bonnie Long, female    DOB: Feb 28, 1996, 24 y.o.   MRN: [id number]  HPI The patient is here for an acute visit.   Yesterday evening.  She started in getting cramping and sharp pain in her lower back and around to both sides.  No urinary symptoms.   No h/o back pain or UTIs.  LMP 1-2 days ago.    Medications and allergies reviewed with patient and updated if appropriate.  Patient Active Problem List   Diagnosis Date Noted  . Vitamin D deficiency 04/10/2020  . Migraine 04/10/2020  . Encounter for physical examination related to employment 04/15/2019  . Hypertension 02/11/2018  . Encounter for well adult exam with abnormal findings 01/21/2018  . Obesity (BMI 30-39.9) 01/21/2018    Current Outpatient Medications on File Prior to Visit  Medication Sig Dispense Refill  . amLODipine (NORVASC) 10 MG tablet Take 1 tablet (10 mg total) by mouth daily. 30 tablet 1   No current facility-administered medications on file prior to visit.    Past Medical History:  Diagnosis Date  . Hypertension     History reviewed. No pertinent surgical history.  Social History   Socioeconomic History  . Marital status: Single    Spouse name: Not on file  . Number of children: Not on file  . Years of education: Not on file  . Highest education level: Not on file  Occupational History  . Not on file  Tobacco Use  . Smoking status: Never Smoker  . Smokeless tobacco: Never Used  Vaping Use  . Vaping Use: Never used  Substance and Sexual Activity  . Alcohol use: No  . Drug use: No  . Sexual activity: Yes    Partners: Male    Birth control/protection: None  Other Topics Concern  . Not on file  Social History Narrative  . Not on file   Social Determinants of Health   Financial Resource Strain: Low Risk   . Difficulty of Paying Living Expenses: Not hard at all  Food Insecurity: No Food Insecurity  . Worried About Programme researcher, broadcasting/film/video in the Last Year:  Never true  . Ran Out of Food in the Last Year: Never true  Transportation Needs: No Transportation Needs  . Lack of Transportation (Medical): No  . Lack of Transportation (Non-Medical): No  Physical Activity: Insufficiently Active  . Days of Exercise per Week: 1 day  . Minutes of Exercise per Session: 10 min  Stress: No Stress Concern Present  . Feeling of Stress : Only a little  Social Connections: Socially Integrated  . Frequency of Communication with Friends and Family: More than three times a week  . Frequency of Social Gatherings with Friends and Family: More than three times a week  . Attends Religious Services: More than 4 times per year  . Active Member of Clubs or Organizations: Yes  . Attends Banker Meetings: More than 4 times per year  . Marital Status: Married    Family History  Problem Relation Age of Onset  . Pulmonary embolism Mother   . Hypertension Maternal Grandfather     Review of Systems  Constitutional: Negative for chills and fever.  Gastrointestinal: Positive for abdominal pain. Negative for nausea.  Genitourinary: Positive for flank pain, frequency and urgency. Negative for dysuria and hematuria.  Musculoskeletal: Positive for back pain.       Objective:   Vitals:   08/23/20 1519  BP:  134/78  Pulse: 90  Temp: 98.3 F (36.8 C)  SpO2: 99%   BP Readings from Last 3 Encounters:  08/23/20 134/78  08/04/20 (!) 182/138  04/10/20 130/86   Wt Readings from Last 3 Encounters:  08/23/20 243 lb (110.2 kg)  08/04/20 244 lb (110.7 kg)  04/10/20 240 lb (108.9 kg)   Body mass index is 43.05 kg/m.   Physical Exam Constitutional:      General: She is not in acute distress.    Appearance: Normal appearance. She is not ill-appearing.  HENT:     Head: Normocephalic and atraumatic.  Abdominal:     General: There is no distension.     Palpations: Abdomen is soft.     Tenderness: There is abdominal tenderness (mild epigastric region and  left flank). There is no right CVA tenderness, left CVA tenderness, guarding or rebound.  Musculoskeletal:     Right lower leg: No edema.     Left lower leg: No edema.  Skin:    General: Skin is warm and dry.  Neurological:     Mental Status: She is alert.            Assessment & Plan:    See Problem List for Assessment and Plan of chronic medical problems.    This visit occurred during the SARS-CoV-2 public health emergency.  Safety protocols were in place, including screening questions prior to the visit, additional usage of staff PPE, and extensive cleaning of exam room while observing appropriate contact time as indicated for disinfecting solutions.

## 2020-08-25 LAB — URINE CULTURE

## 2020-09-08 ENCOUNTER — Encounter: Payer: Self-pay | Admitting: Internal Medicine

## 2020-09-08 ENCOUNTER — Other Ambulatory Visit: Payer: Self-pay

## 2020-09-08 MED ORDER — AMLODIPINE BESYLATE 10 MG PO TABS
10.0000 mg | ORAL_TABLET | Freq: Every day | ORAL | 1 refills | Status: DC
Start: 2020-09-08 — End: 2020-11-06

## 2020-09-15 ENCOUNTER — Ambulatory Visit: Payer: Medicaid Other | Admitting: Adult Health

## 2020-09-18 ENCOUNTER — Ambulatory Visit (INDEPENDENT_AMBULATORY_CARE_PROVIDER_SITE_OTHER): Payer: BC Managed Care – PPO

## 2020-09-18 ENCOUNTER — Other Ambulatory Visit: Payer: Self-pay

## 2020-09-18 VITALS — BP 165/99 | HR 96 | Ht 63.0 in | Wt 243.2 lb

## 2020-09-18 DIAGNOSIS — N926 Irregular menstruation, unspecified: Secondary | ICD-10-CM

## 2020-09-18 DIAGNOSIS — Z3201 Encounter for pregnancy test, result positive: Secondary | ICD-10-CM

## 2020-09-18 LAB — POCT URINE PREGNANCY: Preg Test, Ur: POSITIVE — AB

## 2020-09-18 NOTE — Progress Notes (Addendum)
° °  NURSE VISIT- PREGNANCY CONFIRMATION   SUBJECTIVE:  Bonnie Long is a 24 y.o. G1P0000 female at [redacted]w[redacted]d by certain LMP of Patient's last menstrual period was 08/16/2020 (exact date). Here for pregnancy confirmation.  Home pregnancy test: positive x 5  She reports cramping.  She is taking prenatal vitamins.    OBJECTIVE:  BP (!) 165/99 (BP Location: Left Arm, Patient Position: Sitting, Cuff Size: Normal)    Pulse 96    Ht 5\' 3"  (1.6 m)    Wt 243 lb 3.2 oz (110.3 kg)    LMP 08/16/2020 (Exact Date)    BMI 43.08 kg/m   Appears well, in no apparent distress OB History  Gravida Para Term Preterm AB Living  1 0 0 0 0 0  SAB IAB Ectopic Multiple Live Births  0 0 0 0 0    # Outcome Date GA Lbr Len/2nd Weight Sex Delivery Anes PTL Lv  1 Current             Results for orders placed or performed in visit on 09/18/20 (from the past 24 hour(s))  POCT urine pregnancy   Collection Time: 09/18/20 11:11 AM  Result Value Ref Range   Preg Test, Ur Positive (A) Negative    ASSESSMENT: Positive pregnancy test, [redacted]w[redacted]d by LMP    PLAN: Schedule for dating ultrasound in 4 weeks Prenatal vitamins: continue   Nausea medicines: not currently needed   OB packet given: Yes  Marriah Sanderlin A Inda Mcglothen  09/18/2020 11:16 AM   Pt just now took her BP med--to keep monitoring at home, f/u if it doesn't get back to normal range. Chart reviewed for nurse visit. Agree with plan of care.  09/20/2020, CNM 09/18/2020 1:43 PM

## 2020-10-10 ENCOUNTER — Other Ambulatory Visit: Payer: Self-pay | Admitting: Obstetrics & Gynecology

## 2020-10-10 DIAGNOSIS — O3680X0 Pregnancy with inconclusive fetal viability, not applicable or unspecified: Secondary | ICD-10-CM

## 2020-10-11 ENCOUNTER — Ambulatory Visit (INDEPENDENT_AMBULATORY_CARE_PROVIDER_SITE_OTHER): Payer: BC Managed Care – PPO

## 2020-10-11 ENCOUNTER — Other Ambulatory Visit: Payer: Self-pay

## 2020-10-11 ENCOUNTER — Other Ambulatory Visit: Payer: Self-pay | Admitting: Obstetrics & Gynecology

## 2020-10-11 DIAGNOSIS — O3680X0 Pregnancy with inconclusive fetal viability, not applicable or unspecified: Secondary | ICD-10-CM | POA: Diagnosis not present

## 2020-10-11 DIAGNOSIS — Z3A08 8 weeks gestation of pregnancy: Secondary | ICD-10-CM

## 2020-10-11 NOTE — Progress Notes (Signed)
[place] PQ:[id number] retroverted uterus,8 wks single intrauterine pregnancy with yolk sac,crown rump length 19.12 mm,fetal heart rate 164 bpm, gestational sac 47.2 mm,normal ovaries

## 2020-11-02 ENCOUNTER — Other Ambulatory Visit: Payer: Self-pay | Admitting: Internal Medicine

## 2020-11-02 NOTE — Telephone Encounter (Signed)
Please refill as per office routine med refill policy (all routine meds refilled for 3 mo or monthly per pt preference up to one year from last visit, then month to month grace period for 3 mo, then further med refills will have to be denied)  

## 2020-11-07 ENCOUNTER — Other Ambulatory Visit: Payer: Self-pay | Admitting: Obstetrics & Gynecology

## 2020-11-07 DIAGNOSIS — Z3682 Encounter for antenatal screening for nuchal translucency: Secondary | ICD-10-CM

## 2020-11-07 DIAGNOSIS — O0990 Supervision of high risk pregnancy, unspecified, unspecified trimester: Secondary | ICD-10-CM | POA: Insufficient documentation

## 2020-11-08 ENCOUNTER — Encounter: Payer: Self-pay | Admitting: Advanced Practice Midwife

## 2020-11-08 ENCOUNTER — Ambulatory Visit (INDEPENDENT_AMBULATORY_CARE_PROVIDER_SITE_OTHER): Payer: BC Managed Care – PPO | Admitting: Advanced Practice Midwife

## 2020-11-08 ENCOUNTER — Ambulatory Visit: Payer: BC Managed Care – PPO | Admitting: *Deleted

## 2020-11-08 ENCOUNTER — Ambulatory Visit (INDEPENDENT_AMBULATORY_CARE_PROVIDER_SITE_OTHER): Payer: BC Managed Care – PPO

## 2020-11-08 ENCOUNTER — Other Ambulatory Visit: Payer: Self-pay

## 2020-11-08 VITALS — BP 137/90 | HR 82 | Wt 245.0 lb

## 2020-11-08 DIAGNOSIS — O0992 Supervision of high risk pregnancy, unspecified, second trimester: Secondary | ICD-10-CM

## 2020-11-08 DIAGNOSIS — Z3682 Encounter for antenatal screening for nuchal translucency: Secondary | ICD-10-CM | POA: Diagnosis not present

## 2020-11-08 DIAGNOSIS — Z6841 Body Mass Index (BMI) 40.0 and over, adult: Secondary | ICD-10-CM

## 2020-11-08 DIAGNOSIS — I10 Essential (primary) hypertension: Secondary | ICD-10-CM

## 2020-11-08 DIAGNOSIS — O0991 Supervision of high risk pregnancy, unspecified, first trimester: Secondary | ICD-10-CM | POA: Diagnosis not present

## 2020-11-08 DIAGNOSIS — Z3A12 12 weeks gestation of pregnancy: Secondary | ICD-10-CM | POA: Diagnosis not present

## 2020-11-08 DIAGNOSIS — Z3481 Encounter for supervision of other normal pregnancy, first trimester: Secondary | ICD-10-CM | POA: Diagnosis not present

## 2020-11-08 LAB — POCT URINALYSIS DIPSTICK OB
Blood, UA: NEGATIVE
Glucose, UA: NEGATIVE
Ketones, UA: NEGATIVE
Leukocytes, UA: NEGATIVE
Nitrite, UA: NEGATIVE
POC,PROTEIN,UA: NEGATIVE

## 2020-11-08 MED ORDER — ASPIRIN 81 MG PO CHEW: 162.0000 mg | CHEWABLE_TABLET | Freq: Every day | ORAL | 7 refills | Status: DC

## 2020-11-08 NOTE — Progress Notes (Signed)
INITIAL OBSTETRICAL VISIT Patient name: Bonnie Long MRN [id number]  Date of birth: 1996-02-22 Chief Complaint:   Initial Prenatal Visit (Nt/it)  History of Present Illness:   Bonnie Long is a 25 y.o. G69P0000 African American female at [redacted]w[redacted]d by LMP c/w u/s at 8.3 weeks with an Estimated Date of Delivery: 05/23/21 being seen today for her initial obstetrical visit.   Her obstetrical history is significant for G1; dx with cHTN in November 2021 and taking Norvasc 10mg .   Today she reports no complaints.  Depression screen [facility] 2/9 11/08/2020 11/08/2020 08/04/2020 04/10/2020 04/10/2020  Decreased Interest 0 0 0 0 0  Down, Depressed, Hopeless 0 0 0 0 0  PHQ - 2 Score 0 0 0 0 0  Altered sleeping 2 - 0 - -  Tired, decreased energy 2 - 1 - -  Change in appetite 0 - 0 - -  Feeling bad or failure about yourself  0 - 0 - -  Trouble concentrating 0 - 0 - -  Moving slowly or fidgety/restless 0 - 0 - -  Suicidal thoughts 0 - 0 - -  PHQ-9 Score 4 - 1 - -    Patient's last menstrual period was 08/16/2020 (exact date). Last pap none- will get at next visit Review of Systems:   Pertinent items are noted in HPI Denies cramping/contractions, leakage of fluid, vaginal bleeding, abnormal vaginal discharge w/ itching/odor/irritation, headaches, visual changes, shortness of breath, chest pain, abdominal pain, severe nausea/vomiting, or problems with urination or bowel movements unless otherwise stated above.  Pertinent History Reviewed:  Reviewed past medical,surgical, social, obstetrical and family history.  Reviewed problem list, medications and allergies. OB History  Gravida Para Term Preterm AB Living  1 0 0 0 0 0  SAB IAB Ectopic Multiple Live Births  0 0 0 0 0    # Outcome Date GA Lbr Len/2nd Weight Sex Delivery Anes PTL Lv  1 Current            Physical Assessment:   Vitals:   11/08/20 1027 11/08/20 1050  BP: (!) 145/99 137/90  Pulse: 82   Weight: 245 lb (111.1 kg)   Body mass index is  43.4 kg/m.       Physical Examination:  General appearance - well appearing, and in no distress  Mental status - alert, oriented to person, place, and time  Psych:  She has a normal mood and affect  Skin - warm and dry, normal color, no suspicious lesions noted  Chest - effort normal, all lung fields clear to auscultation bilaterally  Heart - normal rate and regular rhythm  Abdomen - soft, nontender  Extremities:  No swelling or varicosities noted  Pelvic - not indicated  Thin prep pap is not done   TODAY'S NT 01/06/21 12 wks,measurements c/w dates,crl 56.65 mm,anterior placenta,normal right ovary,left ovary not visualized,NB present,NT 1.1 mm,fhr 164 bpm  Results for orders placed or performed in visit on 11/08/20 (from the past 24 hour(s))  POC Urinalysis Dipstick OB   Collection Time: 11/08/20 10:51 AM  Result Value Ref Range   Color, UA     Clarity, UA     Glucose, UA Negative Negative   Bilirubin, UA     Ketones, UA neg    Spec Grav, UA     Blood, UA neg    pH, UA     POC,PROTEIN,UA Negative Negative, Trace, Small (1+), Moderate (2+), Large (3+), 4+   Urobilinogen, UA  Nitrite, UA neg    Leukocytes, UA Negative Negative   Appearance     Odor      Assessment & Plan:  1) High-Risk Pregnancy G1P0000 at [redacted]w[redacted]d with an Estimated Date of Delivery: 05/23/21   2) Initial OB visit  3) cHTN, on Norvasc 10mg  currently and will monitor at present even with higher initial BP; to call if getting DBP= or >100 at home; will need 2x/wk testing starting at 32wks with IOL 38-39wks; rx bASA 162mg  with baseline labs today  Meds:  Meds ordered this encounter  Medications  . aspirin 81 MG chewable tablet    Sig: Chew 2 tablets (162 mg total) by mouth daily.    Dispense:  60 tablet    Refill:  7    Order Specific Question:   Supervising Provider    Answer:   H [[id number]]    Initial labs obtained Continue prenatal vitamins Reviewed n/v relief measures and warning s/s to  report Reviewed recommended weight gain based on pre-gravid BMI Encouraged well-balanced diet Genetic & carrier screening discussed: requests NT/IT, requests [facility] and [facility] 14  Ultrasound discussed; fetal survey: requested CCNC completed> form faxed if has or is planning to apply for medicaid The nature of [facility] for with multiple MDs and other Advanced Practice Providers was explained to patient; also emphasized that fellows, residents, and students are part of our team. Has home bp cuff. Check bp weekly, let Duane Lope know if >140/90.   Indications for ASA therapy (per uptodate) One of the following: CHTN Yes   Indications for early A1C (per uptodate) BMI >=25 (>=23 in Asian women) AND one of the following  High-risk race/ethnicity (eg, African American, Latino, Native American, [employer] American, [place]er) Yes HTN or on therapy for hypertension Yes  Follow-up: Return in about 4 weeks (around 12/06/2020) for HROB, 2nd IT.   Orders Placed This Encounter  Procedures  . GC/Chlamydia Probe Amp  . Urine Culture  . Integrated 1  . Genetic Screening  . Protein / creatinine ratio, urine  . CBC/D/Plt+RPR+Rh+ABO+Rub Ab...  . Comprehensive metabolic panel  . Hemoglobin A1c  . Pain Management Screening Profile (10S)  . POC Urinalysis Dipstick OB    [place] CNM 11/08/2020 1:17 PM

## 2020-11-08 NOTE — Patient Instructions (Signed)
Bonnie Long, I greatly value your feedback.  If you receive a survey following your visit with [place] today, we appreciate you taking the time to fill it out.  Thanks, Philipp Deputy, CNM   [facility] ([address] [place], [place] [address]) Entrance C, located off of [address] Free 24/7 valet parking   Nausea & Vomiting  Have saltine crackers or pretzels by your bed and eat a few bites before you raise your head out of bed in the morning  Eat small frequent meals throughout the day instead of large meals  Drink plenty of fluids throughout the day to stay hydrated, just don't drink a lot of fluids with your meals.  This can make your stomach fill up faster making you feel sick  Do not brush your teeth right after you eat  Products with real ginger are good for nausea, like ginger ale and ginger hard candy Make sure it says made with real ginger!  Sucking on sour candy like lemon heads is also good for nausea  If your prenatal vitamins make you nauseated, take them at night so you will sleep through the nausea  Sea Bands  If you feel like you need medicine for the nausea & vomiting please let us know  If you are unable to keep any fluids or food down please let us know   Constipation  Drink plenty of fluid, preferably water, throughout the day  Eat foods high in fiber such as fruits, vegetables, and grains  Exercise, such as walking, is a good way to keep your bowels regular  Drink warm fluids, especially warm prune juice, or decaf coffee  Eat a 1/2 cup of real oatmeal (not instant), 1/2 cup applesauce, and 1/2-1 cup warm prune juice every day  If needed, you may take Colace (docusate sodium) stool softener once or twice a day to help keep the stool soft.   If you still are having problems with constipation, you may take Miralax once daily as needed to help keep your bowels regular.   Home Blood Pressure Monitoring for Patients   Your  provider has recommended that you check your blood pressure (BP) at least once a week at home. If you do not have a blood pressure cuff at home, one will be provided for you. Contact your provider if you have not received your monitor within 1 week.   Helpful Tips for Accurate Home Blood Pressure Checks  . Don't smoke, exercise, or drink caffeine 30 minutes before checking your BP . Use the restroom before checking your BP (a full bladder can raise your pressure) . Relax in a comfortable upright chair . Feet on the ground . Left arm resting comfortably on a flat surface at the level of your heart . Legs uncrossed . Back supported . Sit quietly and don't talk . Place the cuff on your bare arm . Adjust snuggly, so that only two fingertips can fit between your skin and the top of the cuff . Check 2 readings separated by at least one minute . Keep a log of your BP readings . For a visual, please reference this diagram: http://ccnc.care/bpdiagram  Provider Name: [facility]     Phone: [phone]  Zone 1: ALL CLEAR  Continue to monitor your symptoms:  . BP reading is less than 140 (top number) or less than 90 (bottom number)  . No right upper stomach pain . No headaches or seeing  spots . No feeling nauseated or throwing up . No swelling in face and hands  Zone 2: CAUTION Call your doctor's office for any of the following:  . BP reading is greater than 140 (top number) or greater than 90 (bottom number)  . Stomach pain under your ribs in the middle or right side . Headaches or seeing spots . Feeling nauseated or throwing up . Swelling in face and hands  Zone 3: EMERGENCY  Seek immediate medical care if you have any of the following:  . BP reading is greater than160 (top number) or greater than 110 (bottom number) . Severe headaches not improving with Tylenol . Serious difficulty catching your breath . Any worsening symptoms from Zone 2    First Trimester of Pregnancy The  first trimester of pregnancy is from week 1 until the end of week 12 (months 1 through 3). A week after a sperm fertilizes an egg, the egg will implant on the wall of the uterus. This embryo will begin to develop into a baby. Genes from you and your partner are forming the baby. The female genes determine whether the baby is a boy or a girl. At 6-8 weeks, the eyes and face are formed, and the heartbeat can be seen on ultrasound. At the end of 12 weeks, all the baby's organs are formed.  Now that you are pregnant, you will want to do everything you can to have a healthy baby. Two of the most important things are to get good prenatal care and to follow your health care provider's instructions. Prenatal care is all the medical care you receive before the baby's birth. This care will help prevent, find, and treat any problems during the pregnancy and childbirth. BODY CHANGES Your body goes through many changes during pregnancy. The changes vary from woman to woman.   You may gain or lose a couple of pounds at first.  You may feel sick to your stomach (nauseous) and throw up (vomit). If the vomiting is uncontrollable, call your health care provider.  You may tire easily.  You may develop headaches that can be relieved by medicines approved by your health care provider.  You may urinate more often. Painful urination may mean you have a bladder infection.  You may develop heartburn as a result of your pregnancy.  You may develop constipation because certain hormones are causing the muscles that push waste through your intestines to slow down.  You may develop hemorrhoids or swollen, bulging veins (varicose veins).  Your breasts may begin to grow larger and become tender. Your nipples may stick out more, and the tissue that surrounds them (areola) may become darker.  Your gums may bleed and may be sensitive to brushing and flossing.  Dark spots or blotches (chloasma, mask of pregnancy) may develop on  your face. This will likely fade after the baby is born.  Your menstrual periods will stop.  You may have a loss of appetite.  You may develop cravings for certain kinds of food.  You may have changes in your emotions from day to day, such as being excited to be pregnant or being concerned that something may go wrong with the pregnancy and baby.  You may have more vivid and strange dreams.  You may have changes in your hair. These can include thickening of your hair, rapid growth, and changes in texture. Some women also have hair loss during or after pregnancy, or hair that feels dry or thin. Your hair  will most likely return to normal after your baby is born. WHAT TO EXPECT AT YOUR PRENATAL VISITS During a routine prenatal visit:  You will be weighed to make sure you and the baby are growing normally.  Your blood pressure will be taken.  Your abdomen will be measured to track your baby's growth.  The fetal heartbeat will be listened to starting around week 10 or 12 of your pregnancy.  Test results from any previous visits will be discussed. Your health care provider may ask you:  How you are feeling.  If you are feeling the baby move.  If you have had any abnormal symptoms, such as leaking fluid, bleeding, severe headaches, or abdominal cramping.  If you have any questions. Other tests that may be performed during your first trimester include:  Blood tests to find your blood type and to check for the presence of any previous infections. They will also be used to check for low iron levels (anemia) and Rh antibodies. Later in the pregnancy, blood tests for diabetes will be done along with other tests if problems develop.  Urine tests to check for infections, diabetes, or protein in the urine.  An ultrasound to confirm the proper growth and development of the baby.  An amniocentesis to check for possible genetic problems.  Fetal screens for spina bifida and Down  syndrome.  You may need other tests to make sure you and the baby are doing well. HOME CARE INSTRUCTIONS  Medicines  Follow your health care provider's instructions regarding medicine use. Specific medicines may be either safe or unsafe to take during pregnancy.  Take your prenatal vitamins as directed.  If you develop constipation, try taking a stool softener if your health care provider approves. Diet  Eat regular, well-balanced meals. Choose a variety of foods, such as meat or vegetable-based protein, fish, milk and low-fat dairy products, vegetables, fruits, and whole grain breads and cereals. Your health care provider will help you determine the amount of weight gain that is right for you.  Avoid raw meat and uncooked cheese. These carry germs that can cause birth defects in the baby.  Eating four or five small meals rather than three large meals a day may help relieve nausea and vomiting. If you start to feel nauseous, eating a few soda crackers can be helpful. Drinking liquids between meals instead of during meals also seems to help nausea and vomiting.  If you develop constipation, eat more high-fiber foods, such as fresh vegetables or fruit and whole grains. Drink enough fluids to keep your urine clear or pale yellow. Activity and Exercise  Exercise only as directed by your health care provider. Exercising will help you:  Control your weight.  Stay in shape.  Be prepared for labor and delivery.  Experiencing pain or cramping in the lower abdomen or low back is a good sign that you should stop exercising. Check with your health care provider before continuing normal exercises.  Try to avoid standing for long periods of time. Move your legs often if you must stand in one place for a long time.  Avoid heavy lifting.  Wear low-heeled shoes, and practice good posture.  You may continue to have sex unless your health care provider directs you otherwise. Relief of Pain or  Discomfort  Wear a good support bra for breast tenderness.    Take warm sitz baths to soothe any pain or discomfort caused by hemorrhoids. Use hemorrhoid cream if your health care provider approves.  Rest with your legs elevated if you have leg cramps or low back pain.  If you develop varicose veins in your legs, wear support hose. Elevate your feet for 15 minutes, 3-4 times a day. Limit salt in your diet. Prenatal Care  Schedule your prenatal visits by the twelfth week of pregnancy. They are usually scheduled monthly at first, then more often in the last 2 months before delivery.  Write down your questions. Take them to your prenatal visits.  Keep all your prenatal visits as directed by your health care provider. Safety  Wear your seat belt at all times when driving.  Make a list of emergency phone numbers, including numbers for family, friends, the hospital, and police and fire departments. General Tips  Ask your health care provider for a referral to a local prenatal education class. Begin classes no later than at the beginning of month 6 of your pregnancy.  Ask for help if you have counseling or nutritional needs during pregnancy. Your health care provider can offer advice or refer you to specialists for help with various needs.  Do not use hot tubs, steam rooms, or saunas.  Do not douche or use tampons or scented sanitary pads.  Do not cross your legs for long periods of time.  Avoid cat litter boxes and soil used by cats. These carry germs that can cause birth defects in the baby and possibly loss of the fetus by miscarriage or stillbirth.  Avoid all smoking, herbs, alcohol, and medicines not prescribed by your health care provider. Chemicals in these affect the formation and growth of the baby.  Schedule a dentist appointment. At home, brush your teeth with a soft toothbrush and be gentle when you floss. SEEK MEDICAL CARE IF:   You have dizziness.  You have mild  pelvic cramps, pelvic pressure, or nagging pain in the abdominal area.  You have persistent nausea, vomiting, or diarrhea.  You have a bad smelling vaginal discharge.  You have pain with urination.  You notice increased swelling in your face, hands, legs, or ankles. SEEK IMMEDIATE MEDICAL CARE IF:   You have a fever.  You are leaking fluid from your vagina.  You have spotting or bleeding from your vagina.  You have severe abdominal cramping or pain.  You have rapid weight gain or loss.  You vomit blood or material that looks like coffee grounds.  You are exposed to [place] measles and have never had them.  You are exposed to fifth disease or chickenpox.  You develop a severe headache.  You have shortness of breath.  You have any kind of trauma, such as from a fall or a car accident. Document Released: 09/10/2001 Document Revised: 01/31/2014 Document Reviewed: 07/27/2013 [facility] Patient Information 2015 [place], [place]. This information is not intended to replace advice given to you by your health care provider. Make sure you discuss any questions you have with your health care provider.

## 2020-11-08 NOTE — Progress Notes (Signed)
   NURSE VISIT- [facility]  SUBJECTIVE:  Bonnie Long is a 25 y.o. G11P0000 female here for Panorama NIPT and Horizon Carrier Screening . She is [redacted]w[redacted]d pregnant.   OBJECTIVE:  Appears well, in no apparent distress  Blood work drawn from left [facility] without difficulty. 1 attempt(s).   ASSESSMENT: Pregnancy [redacted]w[redacted]d Panorama NIPT and Horizon Carrier Screening  PLAN: [facility] portal information given and instructed patient how to access results   Jobe Marker  11/08/2020 1:40 PM

## 2020-11-08 NOTE — Progress Notes (Signed)
[place] 12 wks,measurements c/w dates,crl 56.65 mm,anterior placenta,normal right ovary,left ovary not visualized,NB present,NT 1.1 mm,fhr 164 bpm

## 2020-11-09 LAB — PMP SCREEN PROFILE (10S), URINE
Amphetamine Scrn, Ur: NEGATIVE ng/mL
BARBITURATE SCREEN URINE: NEGATIVE ng/mL
BENZODIAZEPINE SCREEN, URINE: NEGATIVE ng/mL
CANNABINOIDS UR QL SCN: NEGATIVE ng/mL
Cocaine (Metab) Scrn, Ur: NEGATIVE ng/mL
Creatinine(Crt), U: 192.3 mg/dL (ref 20.0–300.0)
Methadone Screen, Urine: NEGATIVE ng/mL
OXYCODONE+OXYMORPHONE UR QL SCN: NEGATIVE ng/mL
Opiate Scrn, Ur: NEGATIVE ng/mL
Ph of Urine: 5.8 (ref 4.5–8.9)
Phencyclidine Qn, Ur: NEGATIVE ng/mL
Propoxyphene Scrn, Ur: NEGATIVE ng/mL

## 2020-11-10 LAB — CBC/D/PLT+RPR+RH+ABO+RUB AB...
Antibody Screen: NEGATIVE
Basophils Absolute: 0 10*3/uL (ref 0.0–0.2)
Basos: 0 %
EOS (ABSOLUTE): 0.1 10*3/uL (ref 0.0–0.4)
Eos: 1 %
HCV Ab: <0.1 s/co ratio (ref 0.0–0.9)
HIV Screen 4th Generation wRfx: NONREACTIVE
Hematocrit: 38.2 % (ref 34.0–46.6)
Hemoglobin: 12.8 g/dL (ref 11.1–15.9)
Hepatitis B Surface Ag: NEGATIVE
Immature Grans (Abs): 0 10*3/uL (ref 0.0–0.1)
Immature Granulocytes: 0 %
Lymphocytes Absolute: 1.7 10*3/uL (ref 0.7–3.1)
Lymphs: 29 %
MCH: 27.1 pg (ref 26.6–33.0)
MCHC: 33.5 g/dL (ref 31.5–35.7)
MCV: 81 fL (ref 79–97)
Monocytes Absolute: 0.6 10*3/uL (ref 0.1–0.9)
Monocytes: 10 %
Neutrophils Absolute: 3.4 10*3/uL (ref 1.4–7.0)
Neutrophils: 60 %
Platelets: 283 10*3/uL (ref 150–450)
RBC: 4.72 x10E6/uL (ref 3.77–5.28)
RDW: 13 % (ref 11.7–15.4)
RPR Ser Ql: NONREACTIVE
Rh Factor: POSITIVE
Rubella Antibodies, IGG: 1.35 index (ref >0.99)
WBC: 5.7 10*3/uL (ref 3.4–10.8)

## 2020-11-10 LAB — COMPREHENSIVE METABOLIC PANEL
ALT: 17 IU/L (ref 0–32)
AST: 14 IU/L (ref 0–40)
Albumin/Globulin Ratio: 1.4 (ref 1.2–2.2)
Albumin: 4.1 g/dL (ref 3.9–5.0)
Alkaline Phosphatase: 56 IU/L (ref 44–121)
BUN/Creatinine Ratio: 8 — ABNORMAL LOW (ref 9–23)
BUN: 7 mg/dL (ref 6–20)
Bilirubin Total: 0.3 mg/dL (ref 0.0–1.2)
CO2: 22 mmol/L (ref 20–29)
Calcium: 9 mg/dL (ref 8.7–10.2)
Chloride: 101 mmol/L (ref 96–106)
Creatinine, Ser: 0.87 mg/dL (ref 0.57–1.00)
GFR calc Af Amer: 109 mL/min/{1.73_m2} (ref >59)
GFR calc non Af Amer: 94 mL/min/{1.73_m2} (ref >59)
Globulin, Total: 2.9 g/dL (ref 1.5–4.5)
Glucose: 85 mg/dL (ref 65–99)
Potassium: 4.2 mmol/L (ref 3.5–5.2)
Sodium: 138 mmol/L (ref 134–144)
Total Protein: 7 g/dL (ref 6.0–8.5)

## 2020-11-10 LAB — INTEGRATED 1
Crown Rump Length: 56.7 mm
Gest. Age on Collection Date: 12 weeks
Maternal Age at EDD: 23.6 yr
Nuchal Translucency (NT): 1.1 mm
Number of Fetuses: 1
PAPP-A Value: 794.5 ng/mL
Weight: 245 [lb_av]

## 2020-11-10 LAB — PROTEIN / CREATININE RATIO, URINE
Creatinine, Urine: 183.9 mg/dL
Protein, Ur: 17.2 mg/dL
Protein/Creat Ratio: 94 mg/g creat (ref 0–200)

## 2020-11-10 LAB — HEMOGLOBIN A1C
Est. average glucose Bld gHb Est-mCnc: 111 mg/dL
Hgb A1c MFr Bld: 5.5 % (ref 4.8–5.6)

## 2020-11-10 LAB — URINE CULTURE

## 2020-11-10 LAB — HCV INTERPRETATION

## 2020-11-11 LAB — GC/CHLAMYDIA PROBE AMP
Chlamydia trachomatis, NAA: NEGATIVE
Neisseria Gonorrhoeae by PCR: NEGATIVE

## 2020-11-20 ENCOUNTER — Telehealth: Payer: Self-pay | Admitting: Advanced Practice Midwife

## 2020-11-20 NOTE — Telephone Encounter (Signed)
Babyscripts reported an elevated blood pressure of 130/91 with no symptoms.

## 2020-12-04 ENCOUNTER — Other Ambulatory Visit: Payer: Self-pay | Admitting: Internal Medicine

## 2020-12-06 ENCOUNTER — Ambulatory Visit: Payer: BC Managed Care – PPO | Admitting: Obstetrics & Gynecology

## 2020-12-06 ENCOUNTER — Other Ambulatory Visit: Payer: Self-pay

## 2020-12-06 ENCOUNTER — Encounter: Payer: Self-pay | Admitting: Obstetrics & Gynecology

## 2020-12-06 ENCOUNTER — Other Ambulatory Visit (HOSPITAL_COMMUNITY)
Admission: RE | Admit: 2020-12-06 | Discharge: 2020-12-06 | Disposition: A | Discharge status: Home / Self Care | Payer: BC Managed Care – PPO | Source: Ambulatory Visit | Attending: Advanced Practice Midwife | Admitting: Advanced Practice Midwife

## 2020-12-06 VITALS — BP 134/93 | HR 87 | Wt 242.5 lb

## 2020-12-06 DIAGNOSIS — Z1379 Encounter for other screening for genetic and chromosomal anomalies: Secondary | ICD-10-CM | POA: Diagnosis not present

## 2020-12-06 DIAGNOSIS — Z3A16 16 weeks gestation of pregnancy: Secondary | ICD-10-CM | POA: Insufficient documentation

## 2020-12-06 DIAGNOSIS — O10919 Unspecified pre-existing hypertension complicating pregnancy, unspecified trimester: Secondary | ICD-10-CM

## 2020-12-06 DIAGNOSIS — O0992 Supervision of high risk pregnancy, unspecified, second trimester: Secondary | ICD-10-CM | POA: Diagnosis not present

## 2020-12-06 LAB — POCT URINALYSIS DIPSTICK OB
Glucose, UA: NEGATIVE
Ketones, UA: NEGATIVE
Leukocytes, UA: NEGATIVE
Nitrite, UA: NEGATIVE
POC,PROTEIN,UA: NEGATIVE

## 2020-12-06 NOTE — Progress Notes (Signed)
HIGH-RISK PREGNANCY VISIT Patient name: Bonnie Long MRN [id number]  Date of birth: [birth year] Chief Complaint:   High Risk Gestation (2nd IT & pap today)  History of Present Illness:   Bonnie Long is a 25 y.o. G54P0000 female at 106w0d with an Estimated Date of Delivery: 05/23/21 being seen today for ongoing management of a high-risk pregnancy complicated by chronic hypertension currently on Amlodipine 10mg  daily.  Today she reports no complaints. She has noted decreased appetite.  Contractions: Not present. Vag. Bleeding: None.  Movement: Absent. denies leaking of fluid.  Review of Systems:   Pertinent items are noted in HPI Denies abnormal vaginal discharge w/ itching/odor/irritation, headaches, visual changes, shortness of breath, chest pain, abdominal pain, severe nausea/vomiting, or problems with urination or bowel movements unless otherwise stated above. Pertinent History Reviewed:  Reviewed past medical,surgical, social, obstetrical and family history.  Reviewed problem list, medications and allergies. Physical Assessment:   Vitals:   12/06/20 1017 12/06/20 1019  BP: (!) 144/92 (!) 134/93  Pulse: 82 87  Weight: 242 lb 8 oz (110 kg)   Body mass index is 42.96 kg/m.           Physical Examination:   General appearance: alert, well appearing, and in no distress  Mental status: alert, oriented to person, place, and time  Skin: warm & dry   Extremities: Edema: None    Cardiovascular: normal heart rate noted  Respiratory: normal respiratory effort, no distress  Abdomen: gravid, soft, non-tender  Pelvic: pap collected, cervix appears closed         Fetal Status: Fetal Heart Rate (bpm): 140   Movement: Absent    Fetal Surveillance Testing today: early gestation   Chaperone: 02/05/21    Results for orders placed or performed in visit on 12/06/20 (from the past 24 hour(s))  POC Urinalysis Dipstick OB   Collection Time: 12/06/20 10:18 AM  Result Value Ref Range    Color, UA     Clarity, UA     Glucose, UA Negative Negative   Bilirubin, UA     Ketones, UA neg    Spec Grav, UA     Blood, UA trace    pH, UA     POC,PROTEIN,UA Negative Negative, Trace, Small (1+), Moderate (2+), Large (3+), 4+   Urobilinogen, UA     Nitrite, UA neg    Leukocytes, UA Negative Negative   Appearance     Odor      Assessment & Plan:  High-risk pregnancy: G1P0000 at [redacted]w[redacted]d with an Estimated Date of Delivery: 05/23/21   1) Chronic HTN- BP stable as above on Amlodipine 10mg  daily -pt checking at home, mostly 130s/80s -Reviewed precautions and encouraged to call with BP >140/90s  2) Obesity- BMI-42 Discussed her concerns regarding appetite and weight management  Meds: No orders of the defined types were placed in this encounter.   Labs/procedures today: IT-2  Treatment Plan:  Anatomy scan next visit  Reviewed: Preterm labor symptoms and general obstetric precautions including but not limited to vaginal bleeding, contractions, leaking of fluid and fetal movement were reviewed in detail with the patient.  All questions were answered. Pt has home bp cuff. Check bp weekly, let 05/25/21 know if >140/90.   Follow-up: Return in about 4 weeks (around 01/03/2021) for 20wk anatomy scan and OB visit.   No future appointments.  Orders Placed This Encounter  Procedures  . INTEGRATED 2  . POC Urinalysis Dipstick OB   US

## 2020-12-07 LAB — CYTOLOGY - PAP: Diagnosis: NEGATIVE

## 2020-12-08 LAB — INTEGRATED 2
AFP MoM: 1.61
Alpha-Fetoprotein: 37.8 ng/mL
Crown Rump Length: 56.7 mm
DIA MoM: 1.1
DIA Value: 134.9 pg/mL
Estriol, Unconjugated: 1.49 ng/mL
Gest. Age on Collection Date: 12 wk
Gestational Age: 16 wk
Maternal Age at EDD: 23.6 a
Nuchal Translucency (NT): 1.1 mm
Nuchal Translucency MoM: 0.87
Number of Fetuses: 1
PAPP-A MoM: 1.78
PAPP-A Value: 794.5 ng/mL
Test Results:: NEGATIVE
Weight: 243 [lb_av]
Weight: 245 [lb_av]
hCG MoM: 1.95
hCG Value: 50.2 [IU]/mL
uE3 MoM: 1.85

## 2020-12-26 ENCOUNTER — Encounter: Payer: Self-pay | Admitting: Advanced Practice Midwife

## 2021-01-02 ENCOUNTER — Other Ambulatory Visit: Payer: Self-pay | Admitting: Obstetrics & Gynecology

## 2021-01-02 ENCOUNTER — Other Ambulatory Visit: Payer: Self-pay | Admitting: Internal Medicine

## 2021-01-02 DIAGNOSIS — Z363 Encounter for antenatal screening for malformations: Secondary | ICD-10-CM

## 2021-01-02 NOTE — Telephone Encounter (Signed)
Please refill as per office routine med refill policy (all routine meds refilled for 3 mo or monthly per pt preference up to one year from last visit, then month to month grace period for 3 mo, then further med refills will have to be denied)  

## 2021-01-03 ENCOUNTER — Other Ambulatory Visit: Payer: Self-pay

## 2021-01-03 ENCOUNTER — Encounter: Payer: Self-pay | Admitting: Advanced Practice Midwife

## 2021-01-03 ENCOUNTER — Ambulatory Visit: Payer: Medicaid Other

## 2021-01-03 ENCOUNTER — Ambulatory Visit (INDEPENDENT_AMBULATORY_CARE_PROVIDER_SITE_OTHER): Payer: Self-pay | Admitting: Advanced Practice Midwife

## 2021-01-03 VITALS — BP 135/97 | HR 89 | Wt 245.0 lb

## 2021-01-03 DIAGNOSIS — Z3A20 20 weeks gestation of pregnancy: Secondary | ICD-10-CM

## 2021-01-03 DIAGNOSIS — Z363 Encounter for antenatal screening for malformations: Secondary | ICD-10-CM

## 2021-01-03 DIAGNOSIS — O0992 Supervision of high risk pregnancy, unspecified, second trimester: Secondary | ICD-10-CM

## 2021-01-03 LAB — POCT URINALYSIS DIPSTICK OB
Blood, UA: NEGATIVE
Glucose, UA: NEGATIVE
Ketones, UA: NEGATIVE
Leukocytes, UA: NEGATIVE
Nitrite, UA: NEGATIVE
POC,PROTEIN,UA: NEGATIVE

## 2021-01-03 NOTE — Progress Notes (Signed)
[place] 20 wks,breech,anterior placenta gr 0,normal ovaries,cx 4.5 cm,svp of fluid 5.9 cm,fhr 150 bpm,efw 324 g 44%,anatomy complete,no obvious abnormalities

## 2021-01-03 NOTE — Patient Instructions (Signed)
[name], I greatly value your feedback.  If you receive a survey following your visit with [place] today, we appreciate you taking the time to fill it out.  Thanks, Philipp Deputy CNM  [facility] ([address] [place], [place] [address]) Entrance C, located off of [address] valet parking  Go to [email or web address] to register for FREE online childbirth classes  [place] Pediatricians/Family Doctors:  [facility] [phone]            [facility] [phone]                 [facility] [phone] (usually not accepting new patients unless you have family there already, you are always welcome to call and ask)       [facility] Department [phone]       [facility] Pediatricians/Family Doctors:   [facility]: [phone]  [facility]: [phone]  [facility]: [phone]  [facility] Doctors:   [facility]: [phone]   [facility]: [phone]  [facility] Doctors:  [facility]: [phone]    Home Blood Pressure Monitoring for Patients   Your provider has recommended that you check your blood pressure (BP) at least once a week at home. If you do not have a blood pressure cuff at home, one will be provided for you. Contact your provider if you have not received your monitor within 1 week.   Helpful Tips for Accurate Home Blood Pressure Checks  . Don't smoke, exercise, or drink caffeine 30 minutes before checking your BP . Use the restroom before checking your BP (a full bladder can raise your pressure) . Relax in a comfortable upright chair . Feet on the ground . Left arm resting comfortably on a flat surface at the level of your heart . Legs uncrossed . Back supported . Sit quietly and don't talk . Place the cuff on your bare arm . Adjust snuggly, so that only  two fingertips can fit between your skin and the top of the cuff . Check 2 readings separated by at least one minute . Keep a log of your BP readings . For a visual, please reference this diagram: http://ccnc.care/bpdiagram  Provider Name: [facility]     Phone: [phone]  Zone 1: ALL CLEAR  Continue to monitor your symptoms:  . BP reading is less than 140 (top number) or less than 90 (bottom number)  . No right upper stomach pain . No headaches or seeing spots . No feeling nauseated or throwing up . No swelling in face and hands  Zone 2: CAUTION Call your doctor's office for any of the following:  . BP reading is greater than 140 (top number) or greater than 90 (bottom number)  . Stomach pain under your ribs in the middle or right side . Headaches or seeing spots . Feeling nauseated or throwing up . Swelling in face and hands  Zone 3: EMERGENCY  Seek immediate medical care if you have any of the following:  . BP reading is greater than160 (top number) or greater than 110 (bottom number) . Severe headaches not improving with Tylenol . Serious difficulty catching your breath . Any worsening symptoms from Zone 2     Second Trimester of Pregnancy The second trimester is from week 14 through week 27 (months 4 through 6). The second trimester is often a time when you feel your best.  Your body has adjusted to being pregnant, and you begin to feel better physically. Usually, morning sickness has lessened or quit completely, you may have more energy, and you may have an increase in appetite. The second trimester is also a time when the fetus is growing rapidly. At the end of the sixth month, the fetus is about 9 inches long and weighs about 1 pounds. You will likely begin to feel the baby move (quickening) between 16 and 20 weeks of pregnancy. Body changes during your second trimester Your body continues to go through many changes during your second trimester. The changes vary  from woman to woman.  Your weight will continue to increase. You will notice your lower abdomen bulging out.  You may begin to get stretch marks on your hips, abdomen, and breasts.  You may develop headaches that can be relieved by medicines. The medicines should be approved by your health care provider.  You may urinate more often because the fetus is pressing on your bladder.  You may develop or continue to have heartburn as a result of your pregnancy.  You may develop constipation because certain hormones are causing the muscles that push waste through your intestines to slow down.  You may develop hemorrhoids or swollen, bulging veins (varicose veins).  You may have back pain. This is caused by: ? Weight gain. ? Pregnancy hormones that are relaxing the joints in your pelvis. ? A shift in weight and the muscles that support your balance.  Your breasts will continue to grow and they will continue to become tender.  Your gums may bleed and may be sensitive to brushing and flossing.  Dark spots or blotches (chloasma, mask of pregnancy) may develop on your face. This will likely fade after the baby is born.  A dark line from your belly button to the pubic area (linea nigra) may appear. This will likely fade after the baby is born.  You may have changes in your hair. These can include thickening of your hair, rapid growth, and changes in texture. Some women also have hair loss during or after pregnancy, or hair that feels dry or thin. Your hair will most likely return to normal after your baby is born.  What to expect at prenatal visits During a routine prenatal visit:  You will be weighed to make sure you and the fetus are growing normally.  Your blood pressure will be taken.  Your abdomen will be measured to track your baby's growth.  The fetal heartbeat will be listened to.  Any test results from the previous visit will be discussed.  Your health care provider may ask  you:  How you are feeling.  If you are feeling the baby move.  If you have had any abnormal symptoms, such as leaking fluid, bleeding, severe headaches, or abdominal cramping.  If you are using any tobacco products, including cigarettes, chewing tobacco, and electronic cigarettes.  If you have any questions.  Other tests that may be performed during your second trimester include:  Blood tests that check for: ? Low iron levels (anemia). ? High blood sugar that affects pregnant women (gestational diabetes) between 14 and 28 weeks. ? Rh antibodies. This is to check for a protein on red blood cells (Rh factor).  Urine tests to check for infections, diabetes, or protein in the urine.  An ultrasound to confirm the proper growth and development of the baby.  An amniocentesis to check for possible genetic problems.  Fetal screens  for spina bifida and Down syndrome.  HIV (human immunodeficiency virus) testing. Routine prenatal testing includes screening for HIV, unless you choose not to have this test.  Follow these instructions at home: Medicines  Follow your health care provider's instructions regarding medicine use. Specific medicines may be either safe or unsafe to take during pregnancy.  Take a prenatal vitamin that contains at least 600 micrograms (mcg) of folic acid.  If you develop constipation, try taking a stool softener if your health care provider approves. Eating and drinking  Eat a balanced diet that includes fresh fruits and vegetables, whole grains, good sources of protein such as meat, eggs, or tofu, and low-fat dairy. Your health care provider will help you determine the amount of weight gain that is right for you.  Avoid raw meat and uncooked cheese. These carry germs that can cause birth defects in the baby.  If you have low calcium intake from food, talk to your health care provider about whether you should take a daily calcium supplement.  Limit foods that  are high in fat and processed sugars, such as fried and sweet foods.  To prevent constipation: ? Drink enough fluid to keep your urine clear or pale yellow. ? Eat foods that are high in fiber, such as fresh fruits and vegetables, whole grains, and beans. Activity  Exercise only as directed by your health care provider. Most women can continue their usual exercise routine during pregnancy. Try to exercise for 30 minutes at least 5 days a week. Stop exercising if you experience uterine contractions.  Avoid heavy lifting, wear low heel shoes, and practice good posture.  A sexual relationship may be continued unless your health care provider directs you otherwise. Relieving pain and discomfort  Wear a good support bra to prevent discomfort from breast tenderness.  Take warm sitz baths to soothe any pain or discomfort caused by hemorrhoids. Use hemorrhoid cream if your health care provider approves.  Rest with your legs elevated if you have leg cramps or low back pain.  If you develop varicose veins, wear support hose. Elevate your feet for 15 minutes, 3-4 times a day. Limit salt in your diet. Prenatal Care  Write down your questions. Take them to your prenatal visits.  Keep all your prenatal visits as told by your health care provider. This is important. Safety  Wear your seat belt at all times when driving.  Make a list of emergency phone numbers, including numbers for family, friends, the hospital, and police and fire departments. General instructions  Ask your health care provider for a referral to a local prenatal education class. Begin classes no later than the beginning of month 6 of your pregnancy.  Ask for help if you have counseling or nutritional needs during pregnancy. Your health care provider can offer advice or refer you to specialists for help with various needs.  Do not use hot tubs, steam rooms, or saunas.  Do not douche or use tampons or scented sanitary  pads.  Do not cross your legs for long periods of time.  Avoid cat litter boxes and soil used by cats. These carry germs that can cause birth defects in the baby and possibly loss of the fetus by miscarriage or stillbirth.  Avoid all smoking, herbs, alcohol, and unprescribed drugs. Chemicals in these products can affect the formation and growth of the baby.  Do not use any products that contain nicotine or tobacco, such as cigarettes and e-cigarettes. If you need help quitting,  ask your health care provider.  Visit your dentist if you have not gone yet during your pregnancy. Use a soft toothbrush to brush your teeth and be gentle when you floss. Contact a health care provider if:  You have dizziness.  You have mild pelvic cramps, pelvic pressure, or nagging pain in the abdominal area.  You have persistent nausea, vomiting, or diarrhea.  You have a bad smelling vaginal discharge.  You have pain when you urinate. Get help right away if:  You have a fever.  You are leaking fluid from your vagina.  You have spotting or bleeding from your vagina.  You have severe abdominal cramping or pain.  You have rapid weight gain or weight loss.  You have shortness of breath with chest pain.  You notice sudden or extreme swelling of your face, hands, ankles, feet, or legs.  You have not felt your baby move in over an hour.  You have severe headaches that do not go away when you take medicine.  You have vision changes. Summary  The second trimester is from week 14 through week 27 (months 4 through 6). It is also a time when the fetus is growing rapidly.  Your body goes through many changes during pregnancy. The changes vary from woman to woman.  Avoid all smoking, herbs, alcohol, and unprescribed drugs. These chemicals affect the formation and growth your baby.  Do not use any tobacco products, such as cigarettes, chewing tobacco, and e-cigarettes. If you need help quitting, ask your  health care provider.  Contact your health care provider if you have any questions. Keep all prenatal visits as told by your health care provider. This is important. This information is not intended to replace advice given to you by your health care provider. Make sure you discuss any questions you have with your health care provider. Document Released: 09/10/2001 Document Revised: 02/22/2016 Document Reviewed: 11/17/2012 Elsevier Interactive Patient Education  2017 [employer].

## 2021-01-03 NOTE — Progress Notes (Signed)
HIGH-RISK PREGNANCY VISIT Patient name: Bonnie Long MRN [id number]  Date of birth: 1995/12/12 Chief Complaint:   High Risk Gestation ([place] today)  History of Present Illness:   Bonnie Long is a 25 y.o. G61P0000 female at [redacted]w[redacted]d with an Estimated Date of Delivery: 05/23/21 being seen today for ongoing management of a high-risk pregnancy complicated by chronic hypertension currently on Norvasc 10mg .  Today she reports doing well.  Depression screen [facility] 2/9 11/08/2020 11/08/2020 08/04/2020 04/10/2020 04/10/2020  Decreased Interest 0 0 0 0 0  Down, Depressed, Hopeless 0 0 0 0 0  PHQ - 2 Score 0 0 0 0 0  Altered sleeping 2 - 0 - -  Tired, decreased energy 2 - 1 - -  Change in appetite 0 - 0 - -  Feeling bad or failure about yourself  0 - 0 - -  Trouble concentrating 0 - 0 - -  Moving slowly or fidgety/restless 0 - 0 - -  Suicidal thoughts 0 - 0 - -  PHQ-9 Score 4 - 1 - -    Contractions: Not present. Vag. Bleeding: None.  Movement: Present. denies leaking of fluid.  Review of Systems:   Pertinent items are noted in HPI Denies abnormal vaginal discharge w/ itching/odor/irritation, headaches, visual changes, shortness of breath, chest pain, abdominal pain, severe nausea/vomiting, or problems with urination or bowel movements unless otherwise stated above. Pertinent History Reviewed:  Reviewed past medical,surgical, social, obstetrical and family history.  Reviewed problem list, medications and allergies. Physical Assessment:   Vitals:   01/03/21 1055  BP: (!) 135/97  Pulse: 89  Weight: 245 lb (111.1 kg)  Body mass index is 43.4 kg/m.           Physical Examination:   General appearance: alert, well appearing, and in no distress  Mental status: alert, oriented to person, place, and time  Skin: warm & dry   Extremities: Edema: None    Cardiovascular: normal heart rate noted  Respiratory: normal respiratory effort, no distress  Abdomen: gravid, soft, non-tender  Pelvic: Cervical  exam deferred         Fetal Status: Fetal Heart Rate (bpm): 150 u/s   Movement: Present    Fetal Surveillance Testing today (anatomy scan): 03/05/21 20 wks,breech,anterior placenta gr 0,normal ovaries,cx 4.5 cm,svp of fluid 5.9 cm,fhr 150 bpm,efw 324 g 44%,anatomy complete,no obvious abnormalities    Results for orders placed or performed in visit on 01/03/21 (from the past 24 hour(s))  POC Urinalysis Dipstick OB   Collection Time: 01/03/21 10:59 AM  Result Value Ref Range   Color, UA     Clarity, UA     Glucose, UA Negative Negative   Bilirubin, UA     Ketones, UA neg    Spec Grav, UA     Blood, UA neg    pH, UA     POC,PROTEIN,UA Negative Negative, Trace, Small (1+), Moderate (2+), Large (3+), 4+   Urobilinogen, UA     Nitrite, UA neg    Leukocytes, UA Negative Negative   Appearance     Odor      Assessment & Plan:  High-risk pregnancy: G1P0000 at [redacted]w[redacted]d with an Estimated Date of Delivery: 05/23/21   1) cHTN, stable on Norvasc 10mg  (took 05/25/21 prior to BP today); getting values <140/90 at home; taking bASA   Meds: No orders of the defined types were placed in this encounter.   Labs/procedures today: U/S  Treatment Plan:  Growth 24-28-32-36, 2x/wk testing  at 32wks  Reviewed: Preterm labor symptoms and general obstetric precautions including but not limited to vaginal bleeding, contractions, leaking of fluid and fetal movement were reviewed in detail with the patient.  All questions were answered. Does have home bp cuff. Office bp cuff given: not applicable. Check bp daily, let us know if consistently >140 and/or >90.  Follow-up: No follow-ups on file.   No future appointments.  Orders Placed This Encounter  Procedures  . POC Urinalysis Dipstick OB   Arabella Merles CNM, [facility] 01/03/2021 11:08 AM

## 2021-01-22 ENCOUNTER — Inpatient Hospital Stay (HOSPITAL_COMMUNITY)
Admission: AD | Admit: 2021-01-22 | Discharge: 2021-01-22 | Disposition: A | Discharge status: Home / Self Care | Payer: 59 | Attending: Obstetrics & Gynecology | Admitting: Obstetrics & Gynecology

## 2021-01-22 ENCOUNTER — Encounter (HOSPITAL_COMMUNITY): Payer: Self-pay | Admitting: Obstetrics & Gynecology

## 2021-01-22 ENCOUNTER — Other Ambulatory Visit: Payer: Self-pay

## 2021-01-22 DIAGNOSIS — Z7982 Long term (current) use of aspirin: Secondary | ICD-10-CM | POA: Insufficient documentation

## 2021-01-22 DIAGNOSIS — Z3A22 22 weeks gestation of pregnancy: Secondary | ICD-10-CM | POA: Insufficient documentation

## 2021-01-22 DIAGNOSIS — O10012 Pre-existing essential hypertension complicating pregnancy, second trimester: Secondary | ICD-10-CM | POA: Diagnosis not present

## 2021-01-22 DIAGNOSIS — O10912 Unspecified pre-existing hypertension complicating pregnancy, second trimester: Secondary | ICD-10-CM

## 2021-01-22 LAB — CBC
HCT: 34.3 % — ABNORMAL LOW (ref 36.0–46.0)
Hemoglobin: 11.1 g/dL — ABNORMAL LOW (ref 12.0–15.0)
MCH: 27.1 pg (ref 26.0–34.0)
MCHC: 32.4 g/dL (ref 30.0–36.0)
MCV: 83.9 fL (ref 80.0–100.0)
Platelets: 273 10*3/uL (ref 150–400)
RBC: 4.09 MIL/uL (ref 3.87–5.11)
RDW: 13.5 % (ref 11.5–15.5)
WBC: 10.2 10*3/uL (ref 4.0–10.5)
nRBC: 0 % (ref 0.0–0.2)

## 2021-01-22 LAB — COMPREHENSIVE METABOLIC PANEL
ALT: 15 U/L (ref 0–44)
AST: 15 U/L (ref 15–41)
Albumin: 3 g/dL — ABNORMAL LOW (ref 3.5–5.0)
Alkaline Phosphatase: 52 U/L (ref 38–126)
Anion gap: 7 (ref 5–15)
BUN: 10 mg/dL (ref 6–20)
CO2: 25 mmol/L (ref 22–32)
Calcium: 9 mg/dL (ref 8.9–10.3)
Chloride: 105 mmol/L (ref 98–111)
Creatinine, Ser: 0.84 mg/dL (ref 0.44–1.00)
GFR, Estimated: >60 mL/min (ref >60)
Glucose, Bld: 97 mg/dL (ref 70–99)
Potassium: 3.7 mmol/L (ref 3.5–5.1)
Sodium: 137 mmol/L (ref 135–145)
Total Bilirubin: 0.6 mg/dL (ref 0.3–1.2)
Total Protein: 6.5 g/dL (ref 6.5–8.1)

## 2021-01-22 LAB — URINALYSIS, ROUTINE W REFLEX MICROSCOPIC
Bilirubin Urine: NEGATIVE
Glucose, UA: NEGATIVE mg/dL
Hgb urine dipstick: NEGATIVE
Ketones, ur: NEGATIVE mg/dL
Leukocytes,Ua: NEGATIVE
Nitrite: NEGATIVE
Protein, ur: NEGATIVE mg/dL
Specific Gravity, Urine: 1.008 (ref 1.005–1.030)
pH: 6 (ref 5.0–8.0)

## 2021-01-22 LAB — PROTEIN / CREATININE RATIO, URINE
Creatinine, Urine: 60.78 mg/dL
Protein Creatinine Ratio: 0.18 mg/mg{Cre} — ABNORMAL HIGH (ref 0.00–0.15)
Total Protein, Urine: 11 mg/dL

## 2021-01-22 MED ORDER — NIFEDIPINE ER 60 MG PO TB24: 60.0000 mg | ORAL_TABLET | Freq: Every day | ORAL | 0 refills | Status: DC

## 2021-01-22 NOTE — Discharge Instructions (Signed)
Hypertension During Pregnancy High blood pressure (hypertension) is when the force of blood pumping through the arteries is high enough to cause problems with your health. Arteries are blood vessels that carry blood from the heart throughout the body. Hypertension during pregnancy can cause problems for you and your baby. It can be mild or severe. There are different types of hypertension that can happen during pregnancy. These include:  Chronic hypertension. This happens when you had high blood pressure before you became pregnant, and it continues during the pregnancy. Hypertension that develops before you are [redacted] weeks pregnant and continues during the pregnancy is also called chronic hypertension. If you have chronic hypertension, it will not go away after you have your baby. You will need follow-up visits with your health care provider after you have your baby. Your health care provider may want you to keep taking medicine for your blood pressure.  Gestational hypertension. This is hypertension that develops after the 20th week of pregnancy. Gestational hypertension usually goes away after you have your baby, but your health care provider will need to monitor your blood pressure to make sure that it is getting better.  Postpartum hypertension. This is high blood pressure that was present before delivery and continues after delivery or that starts after delivery. This usually occurs within 48 hours after childbirth but may occur up to 6 weeks after giving birth. When hypertension during pregnancy is severe, it is a medical emergency that requires treatment right away. How does this affect me? Women who have hypertension during pregnancy have a greater chance of developing hypertension later in life or during future pregnancies. In some cases, hypertension during pregnancy can cause serious complications, such as:  Stroke.  Heart attack.  Injury to other organs, such as kidneys, lungs, or  liver.  Preeclampsia.  A condition called hemolysis, elevated liver enzymes, and low platelet count (HELLP) syndrome.  Convulsions or seizures.  Placental abruption. How does this affect my baby? Hypertension during pregnancy can affect your baby. Your baby may:  Be born early (prematurely).  Not weigh as much as he or she should at birth (low birth weight).  Not tolerate labor well, leading to an unplanned cesarean delivery. This condition may also result in a baby's death before birth (stillbirth). What are the risks? There are certain factors that make it more likely for you to develop hypertension during pregnancy. These include:  Having hypertension during a previous pregnancy or a family history of hypertension.  Being overweight.  Being age 35 or older.  Being pregnant for the first time.  Being pregnant with more than one baby.  Becoming pregnant using fertilization methods, such as IVF (in vitro fertilization).  Having other medical problems, such as diabetes, kidney disease, or lupus. What can I do to lower my risk? The exact cause of hypertension during pregnancy is not known. You may be able to lower your risk by:  Maintaining a healthy weight.  Eating a healthy and balanced diet.  Following your health care provider's instructions about treating any long-term conditions that you had before becoming pregnant. It is very important to keep all of your prenatal care appointments. Your health care provider will check your blood pressure and make sure that your pregnancy is progressing as expected. If a problem is found, early treatment can prevent complications.   How is this treated? Treatment for hypertension during pregnancy varies depending on the type of hypertension you have and how serious it is.  If you were   taking medicine for high blood pressure before you became pregnant, talk with your health care provider. You may need to change medicine during  pregnancy because some medicines, like ACE inhibitors, may not be considered safe for your baby.  If you have gestational hypertension, your health care provider may order medicine to treat this during pregnancy.  If you are at risk for preeclampsia, your health care provider may recommend that you take a low-dose aspirin during your pregnancy.  If you have severe hypertension, you may need to be hospitalized so you and your baby can be monitored closely. You may also need to be given medicine to lower your blood pressure.  In some cases, if your condition gets worse, you may need to deliver your baby early. Follow these instructions at home: Eating and drinking  Drink enough fluid to keep your urine pale yellow.  Avoid caffeine.   Lifestyle  Do not use any products that contain nicotine or tobacco. These products include cigarettes, chewing tobacco, and vaping devices, such as e-cigarettes. If you need help quitting, ask your health care provider.  Do not use alcohol or drugs.  Avoid stress as much as possible.  Rest and get plenty of sleep.  Regular exercise can help to reduce your blood pressure. Ask your health care provider what kinds of exercise are best for you. General instructions  Take over-the-counter and prescription medicines only as told by your health care provider.  Keep all prenatal and follow-up visits. This is important. Contact a health care provider if:  You have symptoms that your health care provider told you may require more treatment or monitoring, such as: ? Headaches. ? Nausea or vomiting. ? Abdominal pain. ? Dizziness. ? Light-headedness. Get help right away if:  You have symptoms of serious complications, such as: ? Severe abdominal pain that does not get better with treatment. ? A severe headache that does not get better, blurred vision, or double vision. ? Vomiting that does not get better. ? Sudden, rapid weight gain or swelling in your  hands, ankles, or face. ? Vaginal bleeding. ? Blood in your urine. ? Shortness of breath or chest pain. ? Weakness on one side of your body or difficulty speaking.  Your baby is not moving as much as usual. These symptoms may represent a serious problem that is an emergency. Do not wait to see if the symptoms will go away. Get medical help right away. Call your local emergency services (911 in the [place] Do not drive yourself to the hospital. Summary  Hypertension during pregnancy can cause problems for you and your baby.  Treatment for hypertension during pregnancy varies depending on the type of hypertension you have and how serious it is.  Keep all prenatal and follow-up visits. This is important.  Get help right away if you have symptoms of serious complications related to high blood pressure. This information is not intended to replace advice given to you by your health care provider. Make sure you discuss any questions you have with your health care provider. Document Revised: 06/08/2020 Document Reviewed: 06/08/2020 Elsevier Patient Education  2021 Elsevier Inc.  

## 2021-01-22 NOTE — MAU Note (Signed)
Pt reports yesterday her bp was 138/90 , today it was 143/93. Denies headache for blurred vision.

## 2021-01-22 NOTE — MAU Provider Note (Signed)
History     CSN: [id number]  Arrival date and time: 01/22/21 0111   Event Date/Time   First Provider Initiated Contact with Patient 01/22/21 [phone]      Chief Complaint  Patient presents with  . Hypertension   HPI  Bonnie Long is a 25 y.o. G1P0000 at [redacted]w[redacted]d who presents with hypertension. Has chronic hypertension & is currently taking norvasc 10 mg. Has not missed any doses. Noticed her blood pressure was elevated at home (140s/90s). Denies headache, visual disturbance, or epigastric pain. No ob complaints.   OB History    Gravida  1   Para  0   Term  0   Preterm  0   AB  0   Living  0     SAB  0   IAB  0   Ectopic  0   Multiple  0   Live Births  0           Past Medical History:  Diagnosis Date  . Hypertension     Past Surgical History:  Procedure Laterality Date  . NO PAST SURGERIES      Family History  Problem Relation Age of Onset  . Pulmonary embolism Mother   . Hypertension Maternal Grandfather     Social History   Tobacco Use  . Smoking status: Never Smoker  . Smokeless tobacco: Never Used  Vaping Use  . Vaping Use: Never used  Substance Use Topics  . Alcohol use: No  . Drug use: No    Allergies: No Known Allergies  Medications Prior to Admission  Medication Sig Dispense Refill Last Dose  . amLODipine (NORVASC) 10 MG tablet TAKE 1 TABLET BY MOUTH EVERY DAY 30 tablet 1 01/21/2021 at Unknown time  . aspirin 81 MG chewable tablet Chew 2 tablets (162 mg total) by mouth daily. 60 tablet 7 01/21/2021 at Unknown time  . Prenatal Vit-Fe Fumarate-FA (PRENATAL VITAMIN PO) Take by mouth.   01/21/2021 at Unknown time    Review of Systems  Constitutional: Negative.   Eyes: Negative for visual disturbance.  Gastrointestinal: Negative.   Genitourinary: Negative.   Neurological: Negative for headaches.   Physical Exam   Blood pressure 120/74, pulse 86, temperature (P) 98.6 F (37 C), temperature source (P) Oral, resp. rate (P) 18,  height (P) 5\' 3"  (1.6 m), weight (P) 114.8 kg, last menstrual period 08/16/2020, SpO2 97 %.  Patient Vitals for the past 24 hrs:  BP Temp Temp src Pulse Resp SpO2 Height Weight  01/22/21 0235 120/74 -- -- 86 -- -- -- --  01/22/21 0200 (!) 156/99 -- -- 93 -- 97 % -- --  01/22/21 0155 -- -- -- -- -- 97 % -- --  01/22/21 0145 (!) 155/104 -- -- 91 -- 98 % -- --  01/22/21 0138 (!) 154/101 -- -- 98 -- -- -- --  01/22/21 0120 -- (P) 98.6 F (37 C) (P) Oral (P) 93 (P) 18 (P) 100 % (P) 5\' 3"  (1.6 m) (P) 114.8 kg    Physical Exam Vitals and nursing note reviewed.  Constitutional:      General: She is not in acute distress.    Appearance: Normal appearance.  HENT:     Head: Normocephalic and atraumatic.  Cardiovascular:     Rate and Rhythm: Normal rate and regular rhythm.     Heart sounds: Normal heart sounds.  Pulmonary:     Effort: Pulmonary effort is normal. No respiratory distress.     Breath  sounds: Normal breath sounds. No wheezing.  Musculoskeletal:     Right lower leg: No edema.     Left lower leg: No edema.  Skin:    General: Skin is warm and dry.  Neurological:     Mental Status: She is alert.     Deep Tendon Reflexes: Reflexes normal.  Psychiatric:        Mood and Affect: Mood normal.        Behavior: Behavior normal.     MAU Course  Procedures Results for orders placed or performed during the hospital encounter of 01/22/21 (from the past 24 hour(s))  Urinalysis, Routine w reflex microscopic Urine, Clean Catch     Status: Abnormal   Collection Time: 01/22/21  1:37 AM  Result Value Ref Range   Color, Urine STRAW (A) YELLOW   APPearance CLEAR CLEAR   Specific Gravity, Urine 1.008 1.005 - 1.030   pH 6.0 5.0 - 8.0   Glucose, UA NEGATIVE NEGATIVE mg/dL   Hgb urine dipstick NEGATIVE NEGATIVE   Bilirubin Urine NEGATIVE NEGATIVE   Ketones, ur NEGATIVE NEGATIVE mg/dL   Protein, ur NEGATIVE NEGATIVE mg/dL   Nitrite NEGATIVE NEGATIVE   Leukocytes,Ua NEGATIVE NEGATIVE   Protein / creatinine ratio, urine     Status: Abnormal   Collection Time: 01/22/21  1:37 AM  Result Value Ref Range   Creatinine, Urine 60.78 mg/dL   Total Protein, Urine 11 mg/dL   Protein Creatinine Ratio 0.18 (H) 0.00 - 0.15 mg/mg[Cre]  CBC     Status: Abnormal   Collection Time: 01/22/21  2:17 AM  Result Value Ref Range   WBC 10.2 4.0 - 10.5 K/uL   RBC 4.09 3.87 - 5.11 MIL/uL   Hemoglobin 11.1 (L) 12.0 - 15.0 g/dL   HCT 17.7 (L) 93.9 - 03.0 %   MCV 83.9 80.0 - 100.0 fL   MCH 27.1 26.0 - 34.0 pg   MCHC 32.4 30.0 - 36.0 g/dL   RDW 09.2 33.0 - 07.6 %   Platelets 273 150 - 400 K/uL   nRBC 0.0 0.0 - 0.2 %  Comprehensive metabolic panel     Status: Abnormal   Collection Time: 01/22/21  2:17 AM  Result Value Ref Range   Sodium 137 135 - 145 mmol/L   Potassium 3.7 3.5 - 5.1 mmol/L   Chloride 105 98 - 111 mmol/L   CO2 25 22 - 32 mmol/L   Glucose, Bld 97 70 - 99 mg/dL   BUN 10 6 - 20 mg/dL   Creatinine, Ser 2.26 0.44 - 1.00 mg/dL   Calcium 9.0 8.9 - 33.3 mg/dL   Total Protein 6.5 6.5 - 8.1 g/dL   Albumin 3.0 (L) 3.5 - 5.0 g/dL   AST 15 15 - 41 U/L   ALT 15 0 - 44 U/L   Alkaline Phosphatase 52 38 - 126 U/L   Total Bilirubin 0.6 0.3 - 1.2 mg/dL   GFR, Estimated >54 >56 mL/min   Anion gap 7 5 - 15    MDM Patient with preexisting chronic hypertension currently taking norvasc 10 mg. BPs slightly higher than her baseline in the office. She is asymptomatic & preeclampsia labs are normal. Per consult with Dr. Charlotta Newton, will d/c norvasc and start on procardia xl 60 mg. Pt should have BP check in office later this week.   Assessment and Plan   1. Maternal chronic hypertension in second trimester   2. [redacted] weeks gestation of pregnancy    -d/c norvasc -Rx procardia  xl 60 mg daily -reviewed s/s preeclampsia & reasons to return to MAU -msg to office for BP check later this week   Judeth Long 01/22/2021, 3:37 AM

## 2021-01-29 ENCOUNTER — Encounter: Payer: Self-pay | Admitting: *Deleted

## 2021-01-29 ENCOUNTER — Ambulatory Visit (INDEPENDENT_AMBULATORY_CARE_PROVIDER_SITE_OTHER): Payer: 59 | Admitting: *Deleted

## 2021-01-29 ENCOUNTER — Other Ambulatory Visit: Payer: Self-pay

## 2021-01-29 DIAGNOSIS — O0992 Supervision of high risk pregnancy, unspecified, second trimester: Secondary | ICD-10-CM

## 2021-01-29 NOTE — Progress Notes (Addendum)
   NURSE VISIT- BLOOD PRESSURE CHECK  SUBJECTIVE:  Bonnie Long is a 25 y.o. G65P0000 female here for BP check. She is [redacted]w[redacted]d pregnant    HYPERTENSION ROS:  Pregnant/postpartum:  . Severe headaches that don't go away with tylenol/other medicines: No  . Visual changes (seeing spots/double/blurred vision) No  . Severe pain under right breast breast or in center of upper chest No  . Severe nausea/vomiting No  . Taking medicines as instructed yes  .   OBJECTIVE:  BP 125/90   Pulse 83   Wt 248 lb (112.5 kg)   LMP 08/16/2020 (Exact Date)   BMI (P) 43.93 kg/m   Appearance alert, well appearing, and in no distress.  ASSESSMENT: Pregnancy [redacted]w[redacted]d  blood pressure check  PLAN: Discussed with Joellyn Haff, CNM, [facility]   Recommendations: no changes needed   Follow-up: as scheduled   Malachy Mood  01/29/2021 4:19 PM   Chart reviewed for nurse visit. Agree with plan of care.  Cheral Marker, [place] 01/29/2021 4:43 PM

## 2021-01-31 ENCOUNTER — Other Ambulatory Visit: Payer: Self-pay

## 2021-01-31 ENCOUNTER — Ambulatory Visit (INDEPENDENT_AMBULATORY_CARE_PROVIDER_SITE_OTHER): Payer: 59 | Admitting: Obstetrics & Gynecology

## 2021-01-31 ENCOUNTER — Encounter: Payer: Self-pay | Admitting: Obstetrics & Gynecology

## 2021-01-31 VITALS — BP 130/84 | HR 95 | Wt 246.0 lb

## 2021-01-31 DIAGNOSIS — O10912 Unspecified pre-existing hypertension complicating pregnancy, second trimester: Secondary | ICD-10-CM

## 2021-01-31 DIAGNOSIS — O0992 Supervision of high risk pregnancy, unspecified, second trimester: Secondary | ICD-10-CM

## 2021-01-31 NOTE — Progress Notes (Signed)
HIGH-RISK PREGNANCY VISIT Patient name: Bonnie Long MRN [id number]  Date of birth: 1996-06-29 Chief Complaint:   Routine Prenatal Visit  History of Present Illness:   Bonnie Long is a 25 y.o. G50P0000 female at [redacted]w[redacted]d with an Estimated Date of Delivery: 05/23/21 being seen today for ongoing management of a high-risk pregnancy complicated by  1) chronic HTN Currently on Procardia XL 60mg  daily.    Today she reports no complaints.   Contractions: Not present. Vag. Bleeding: None.  Movement: Present. denies leaking of fluid.   Depression screen [facility] 2/9 11/08/2020 11/08/2020 08/04/2020 04/10/2020 04/10/2020  Decreased Interest 0 0 0 0 0  Down, Depressed, Hopeless 0 0 0 0 0  PHQ - 2 Score 0 0 0 0 0  Altered sleeping 2 - 0 - -  Tired, decreased energy 2 - 1 - -  Change in appetite 0 - 0 - -  Feeling bad or failure about yourself  0 - 0 - -  Trouble concentrating 0 - 0 - -  Moving slowly or fidgety/restless 0 - 0 - -  Suicidal thoughts 0 - 0 - -  PHQ-9 Score 4 - 1 - -     Current Outpatient Medications  Medication Instructions  . aspirin 162 mg, Oral, Daily  . NIFEdipine (ADALAT CC) 60 mg, Oral, Daily  . Prenatal Vit-Fe Fumarate-FA (PRENATAL VITAMIN PO) Oral     Review of Systems:   Pertinent items are noted in HPI Denies abnormal vaginal discharge w/ itching/odor/irritation, headaches, visual changes, shortness of breath, chest pain, abdominal pain, severe nausea/vomiting, or problems with urination or bowel movements unless otherwise stated above. Pertinent History Reviewed:  Reviewed past medical,surgical, social, obstetrical and family history.  Reviewed problem list, medications and allergies. Physical Assessment:   Vitals:   01/31/21 1407  BP: 130/84  Pulse: 95  Weight: 246 lb (111.6 kg)  Body mass index is 43.58 kg/m (pended).           Physical Examination:   General appearance: alert, well appearing, and in no distress  Mental status: alert, oriented to  person, place, and time  Skin: warm & dry   Extremities: Edema: None    Cardiovascular: normal heart rate noted  Respiratory: normal respiratory effort, no distress  Abdomen: gravid, soft, non-tender  Pelvic: Cervical exam deferred         Fetal Status: Fetal Heart Rate (bpm): 140 Fundal Height: 27 cm Movement: Present    Fetal Surveillance Testing today: doppler   Chaperone: N/A    No results found for this or any previous visit (from the past 24 hour(s)).   Assessment & Plan:  High-risk pregnancy: G1P0000 at [redacted]w[redacted]d with an Estimated Date of Delivery: 05/23/21   1) chronic HTN -taking Procardia and ASA daily -BP within range -growth scan next visit   Meds: No orders of the defined types were placed in this encounter.   Labs/procedures today: none  Treatment Plan:  PN-2 and growth scan next visit  Reviewed: Preterm labor symptoms and general obstetric precautions including but not limited to vaginal bleeding, contractions, leaking of fluid and fetal movement were reviewed in detail with the patient.  All questions were answered. Pt has home bp cuff. Check bp weekly, let 05/25/21 know if >140/90.   Follow-up: Return in about 4 weeks (around 02/28/2021) for HROB visit, PN-2, growth scan.   No future appointments.  No orders of the defined types were placed in this encounter.   04/30/2021, DO  Attending [place], [facility] for [employer], [place]

## 2021-02-10 ENCOUNTER — Other Ambulatory Visit: Payer: Self-pay | Admitting: Internal Medicine

## 2021-02-21 ENCOUNTER — Other Ambulatory Visit: Payer: Self-pay

## 2021-02-21 ENCOUNTER — Encounter: Payer: Self-pay | Admitting: Internal Medicine

## 2021-02-22 ENCOUNTER — Telehealth: Payer: Self-pay | Admitting: Internal Medicine

## 2021-02-22 ENCOUNTER — Telehealth: Payer: Self-pay | Admitting: Women's Health

## 2021-02-22 ENCOUNTER — Other Ambulatory Visit: Payer: Self-pay | Admitting: Advanced Practice Midwife

## 2021-02-22 NOTE — Telephone Encounter (Signed)
Pt was seen at [facility] 4/25 & provider there changed her BP meds - pt needs refill  NIFEdipine (ADALAT CC) 60 MG 24 hr tablet   Please advise & notify pt    [facility]

## 2021-02-22 NOTE — Telephone Encounter (Signed)
Patient states that she was prescribed Nifedipine while at the hospital for hypertension in pregnancy. Please advise on refill. Does patient need to contact OB-GYN for refill?

## 2021-02-22 NOTE — Telephone Encounter (Signed)
1.Medication Requested: NIFEdipine (ADALAT CC) 60 MG 24 hr tablet  2. Pharmacy (Name, [address], [place]):  [id number] - [place], [place] - [phone] Phone:  [phone]  Fax:  [phone]     (Patient completely out of this medication)  3. On Med List: Y  4. Last Visit with PCP: 03/2020 (physical)  5. Next visit date with PCP: 02/27/21 f/u visit  *Patient has been told that she hasn't seen Dr. Jonny Ruiz since July of last year and that he didn't refill the medication. She doesn't  Know who else to get the meds refilled through since the last provider was an ED provider.   Please advise if medication will be sent now, or after appointment. Patient is okay with a My Chart response to her request.    Agent: Please be advised that RX refills may take up to 3 business days. We ask that you follow-up with your pharmacy.

## 2021-02-22 NOTE — Telephone Encounter (Signed)
Yes, for this special situation, she should have refills per GYN, thanks

## 2021-02-27 ENCOUNTER — Ambulatory Visit: Payer: 59 | Admitting: Internal Medicine

## 2021-02-28 ENCOUNTER — Other Ambulatory Visit: Payer: Self-pay | Admitting: Obstetrics & Gynecology

## 2021-02-28 DIAGNOSIS — O10919 Unspecified pre-existing hypertension complicating pregnancy, unspecified trimester: Secondary | ICD-10-CM

## 2021-02-28 NOTE — Telephone Encounter (Signed)
Already refilled

## 2021-03-01 ENCOUNTER — Ambulatory Visit (INDEPENDENT_AMBULATORY_CARE_PROVIDER_SITE_OTHER): Payer: 59 | Admitting: Advanced Practice Midwife

## 2021-03-01 ENCOUNTER — Other Ambulatory Visit: Payer: 59

## 2021-03-01 ENCOUNTER — Other Ambulatory Visit: Payer: Self-pay

## 2021-03-01 ENCOUNTER — Ambulatory Visit (INDEPENDENT_AMBULATORY_CARE_PROVIDER_SITE_OTHER): Payer: 59

## 2021-03-01 VITALS — BP 132/92 | HR 97 | Wt 250.0 lb

## 2021-03-01 DIAGNOSIS — Z3A28 28 weeks gestation of pregnancy: Secondary | ICD-10-CM

## 2021-03-01 DIAGNOSIS — O10919 Unspecified pre-existing hypertension complicating pregnancy, unspecified trimester: Secondary | ICD-10-CM

## 2021-03-01 DIAGNOSIS — Z23 Encounter for immunization: Secondary | ICD-10-CM | POA: Diagnosis not present

## 2021-03-01 DIAGNOSIS — O0992 Supervision of high risk pregnancy, unspecified, second trimester: Secondary | ICD-10-CM

## 2021-03-01 DIAGNOSIS — Z131 Encounter for screening for diabetes mellitus: Secondary | ICD-10-CM

## 2021-03-01 DIAGNOSIS — O0993 Supervision of high risk pregnancy, unspecified, third trimester: Secondary | ICD-10-CM | POA: Diagnosis not present

## 2021-03-01 DIAGNOSIS — O403XX0 Polyhydramnios, third trimester, not applicable or unspecified: Secondary | ICD-10-CM | POA: Insufficient documentation

## 2021-03-01 NOTE — Progress Notes (Addendum)
[place] 28+1 wks,breech,cx 3.1 cm,anterior placenta gr 0,normal ovaries,AFI 27.8 cm,polyhydramnios,fhr 148 bpm,EFW 1352 g 76%

## 2021-03-01 NOTE — Progress Notes (Signed)
   HIGH-RISK PREGNANCY VISIT Patient name: Bonnie Long MRN [id number]  Date of birth: 10-Mar-1996 Chief Complaint:   Routine Prenatal Visit  History of Present Illness:   GISSELLE GALVIS is a 25 y.o. G56P0000 female at [redacted]w[redacted]d with an Estimated Date of Delivery: 05/23/21 being seen today for ongoing management of a high-risk pregnancy complicated by [facility] currently on Procardia xl 60mg  daily Today she reports some pelvic pain, goes away after resting. Contractions: Not present. Vag. Bleeding: None.  Movement: Present. denies leaking of fluid.  Review of Systems:   Pertinent items are noted in HPI Denies abnormal vaginal discharge w/ itching/odor/irritation, headaches, visual changes, shortness of breath, chest pain, abdominal pain, severe nausea/vomiting, or problems with urination or bowel movements unless otherwise stated above. Pertinent History Reviewed:  Reviewed past medical,surgical, social, obstetrical and family history.  Reviewed problem list, medications and allergies. Physical Assessment:   Vitals:   03/01/21 0938 03/01/21 0940  BP: (!) 133/93 (!) 132/92  Pulse: 92 97  Weight: 250 lb (113.4 kg)   Body mass index is 44.29 kg/m (pended).           Physical Examination:   General appearance: alert, well appearing, and in no distress  Mental status: alert, oriented to person, place, and time  Skin: warm & dry   Extremities: Edema: None    Cardiovascular: normal heart rate noted  Respiratory: normal respiratory effort, no distress  Abdomen: gravid, soft, non-tender  Pelvic: Cervical exam deferred         Fetal Status:     Movement: Present    Fetal Surveillance Testing today: 05/01/21 28+1 wks,breech,cx 3.1 cm,anterior placenta gr 0,normal ovaries,AFI 27.8 cm,polyhydramnios,fhr 148 bpm,EFW 1352 g 76%  No results found for this or any previous visit (from the past 24 hour(s)).  Assessment & Plan:  1) High-risk pregnancy G1P0000 at [redacted]w[redacted]d with an Estimated Date of Delivery:  05/23/21   2) CHTN, stable Treatment plan  Continue meds; Start twice weekly testing at 32 weeks  3) Polyhydramnios, mild  Treatment plan per [facility] guidelines  Meds: No orders of the defined types were placed in this encounter.   Labs/procedures today: PN2   Reviewed: Preterm labor symptoms and general obstetric precautions including but not limited to vaginal bleeding, contractions, leaking of fluid and fetal movement were reviewed in detail with the patient.  All questions were answered. Has home bp cuff. Check bp weekly, let [facility] know if >140/90.   Follow-up: Return in about 4 weeks (around 03/29/2021) for Start Thursdays US/HROB and Mondays (RN) NST for 7 weeks.  No future appointments.  Orders Placed This Encounter  Procedures  . Tdap vaccine greater than or equal to 7yo IM   12-27-1971 DNP, CNM 03/01/2021 9:59 AM

## 2021-03-01 NOTE — Addendum Note (Signed)
Addended by: Jacklyn Shell on: 03/01/2021 11:13 AM   Modules accepted: Orders

## 2021-03-01 NOTE — Patient Instructions (Signed)
Bonnie Long, I greatly value your feedback.  If you receive a survey following your visit with [place] today, we appreciate you taking the time to fill it out.  Thanks, Cathie Beams, CNM   [facility] HAS MOVED!!! It is now [facility] ([address] [place], [place] [address]) Entrance located off of [address] Free 24/7 valet parking   Go to [email or web address] to register for FREE online childbirth classes    Call the office [phone]) or go to [facility] if:  You begin to have strong, frequent contractions  Your water breaks.  Sometimes it is a big gush of fluid, sometimes it is just a trickle that keeps getting your panties wet or running down your legs  You have vaginal bleeding.  It is normal to have a small amount of spotting if your cervix was checked.   You don't feel your baby moving like normal.  If you don't, get you something to eat and drink and lay down and focus on feeling your baby move.  You should feel at least 10 movements in 2 hours.  If you don't, you should call the office or go to [facility].    Tdap Vaccine  It is recommended that you get the Tdap vaccine during the third trimester of EACH pregnancy to help protect your baby from getting pertussis (whooping cough)  27-36 weeks is the BEST time to do this so that you can pass the protection on to your baby. During pregnancy is better than after pregnancy, but if you are unable to get it during pregnancy it will be offered at the hospital.   You will be offered this vaccine in the office after 27 weeks. If you do not have health insurance, you can get this vaccine at the health department or your family doctor  Everyone who will be around your baby should also be up-to-date on their vaccines. Adults (who are not pregnant) only need 1 dose of Tdap during adulthood.   Third Trimester of Pregnancy The third trimester is from week 29 through week 42, months 7 through 9.  The third trimester is a time when the fetus is growing rapidly. At the end of the ninth month, the fetus is about 20 inches in length and weighs 6-10 pounds.  BODY CHANGES Your body goes through many changes during pregnancy. The changes vary from woman to woman.   Your weight will continue to increase. You can expect to gain 25-35 pounds (11-16 kg) by the end of the pregnancy.  You may begin to get stretch marks on your hips, abdomen, and breasts.  You may urinate more often because the fetus is moving lower into your pelvis and pressing on your bladder.  You may develop or continue to have heartburn as a result of your pregnancy.  You may develop constipation because certain hormones are causing the muscles that push waste through your intestines to slow down.  You may develop hemorrhoids or swollen, bulging veins (varicose veins).  You may have pelvic pain because of the weight gain and pregnancy hormones relaxing your joints between the bones in your pelvis. Backaches may result from overexertion of the muscles supporting your posture.  You may have changes in your hair. These can include thickening of your hair, rapid growth, and changes in texture. Some women also have hair loss during or after pregnancy, or hair that feels dry or thin. Your hair will most likely return to  normal after your baby is born.  Your breasts will continue to grow and be tender. A yellow discharge may leak from your breasts called colostrum.  Your belly button may stick out.  You may feel short of breath because of your expanding uterus.  You may notice the fetus "dropping," or moving lower in your abdomen.  You may have a bloody mucus discharge. This usually occurs a few days to a week before labor begins.  Your cervix becomes thin and soft (effaced) near your due date. WHAT TO EXPECT AT YOUR PRENATAL EXAMS  You will have prenatal exams every 2 weeks until week 36. Then, you will have weekly prenatal  exams. During a routine prenatal visit:  You will be weighed to make sure you and the fetus are growing normally.  Your blood pressure is taken.  Your abdomen will be measured to track your baby's growth.  The fetal heartbeat will be listened to.  Any test results from the previous visit will be discussed.  You may have a cervical check near your due date to see if you have effaced. At around 36 weeks, your caregiver will check your cervix. At the same time, your caregiver will also perform a test on the secretions of the vaginal tissue. This test is to determine if a type of bacteria, Group B streptococcus, is present. Your caregiver will explain this further. Your caregiver may ask you:  What your birth plan is.  How you are feeling.  If you are feeling the baby move.  If you have had any abnormal symptoms, such as leaking fluid, bleeding, severe headaches, or abdominal cramping.  If you have any questions. Other tests or screenings that may be performed during your third trimester include:  Blood tests that check for low iron levels (anemia).  Fetal testing to check the health, activity level, and growth of the fetus. Testing is done if you have certain medical conditions or if there are problems during the pregnancy. FALSE LABOR You may feel small, irregular contractions that eventually go away. These are called Braxton Hicks contractions, or false labor. Contractions may last for hours, days, or even weeks before true labor sets in. If contractions come at regular intervals, intensify, or become painful, it is best to be seen by your caregiver.  SIGNS OF LABOR   Menstrual-like cramps.  Contractions that are 5 minutes apart or less.  Contractions that start on the top of the uterus and spread down to the lower abdomen and back.  A sense of increased pelvic pressure or back pain.  A watery or bloody mucus discharge that comes from the vagina. If you have any of these  signs before the 37th week of pregnancy, call your caregiver right away. You need to go to the hospital to get checked immediately. HOME CARE INSTRUCTIONS   Avoid all smoking, herbs, alcohol, and unprescribed drugs. These chemicals affect the formation and growth of the baby.  Follow your caregiver's instructions regarding medicine use. There are medicines that are either safe or unsafe to take during pregnancy.  Exercise only as directed by your caregiver. Experiencing uterine cramps is a good sign to stop exercising.  Continue to eat regular, healthy meals.  Wear a good support bra for breast tenderness.  Do not use hot tubs, steam rooms, or saunas.  Wear your seat belt at all times when driving.  Avoid raw meat, uncooked cheese, cat litter boxes, and soil used by cats. These carry germs that can  cause birth defects in the baby.  Take your prenatal vitamins.  Try taking a stool softener (if your caregiver approves) if you develop constipation. Eat more high-fiber foods, such as fresh vegetables or fruit and whole grains. Drink plenty of fluids to keep your urine clear or pale yellow.  Take warm sitz baths to soothe any pain or discomfort caused by hemorrhoids. Use hemorrhoid cream if your caregiver approves.  If you develop varicose veins, wear support hose. Elevate your feet for 15 minutes, 3-4 times a day. Limit salt in your diet.  Avoid heavy lifting, wear low heal shoes, and practice good posture.  Rest a lot with your legs elevated if you have leg cramps or low back pain.  Visit your dentist if you have not gone during your pregnancy. Use a soft toothbrush to brush your teeth and be gentle when you floss.  A sexual relationship may be continued unless your caregiver directs you otherwise.  Do not travel far distances unless it is absolutely necessary and only with the approval of your caregiver.  Take prenatal classes to understand, practice, and ask questions about the  labor and delivery.  Make a trial run to the hospital.  Pack your hospital bag.  Prepare the baby's nursery.  Continue to go to all your prenatal visits as directed by your caregiver. SEEK MEDICAL CARE IF:  You are unsure if you are in labor or if your water has broken.  You have dizziness.  You have mild pelvic cramps, pelvic pressure, or nagging pain in your abdominal area.  You have persistent nausea, vomiting, or diarrhea.  You have a bad smelling vaginal discharge.  You have pain with urination. SEEK IMMEDIATE MEDICAL CARE IF:   You have a fever.  You are leaking fluid from your vagina.  You have spotting or bleeding from your vagina.  You have severe abdominal cramping or pain.  You have rapid weight loss or gain.  You have shortness of breath with chest pain.  You notice sudden or extreme swelling of your face, hands, ankles, feet, or legs.  You have not felt your baby move in over an hour.  You have severe headaches that do not go away with medicine.  You have vision changes. Document Released: 09/10/2001 Document Revised: 09/21/2013 Document Reviewed: 11/17/2012 [facility] Patient Information 2015 [place], [place]. This information is not intended to replace advice given to you by your health care provider. Make sure you discuss any questions you have with your health care provider.

## 2021-03-02 LAB — CBC
Hematocrit: 37.9 % (ref 34.0–46.6)
Hemoglobin: 11.9 g/dL (ref 11.1–15.9)
MCH: 26.2 pg — ABNORMAL LOW (ref 26.6–33.0)
MCHC: 31.4 g/dL — ABNORMAL LOW (ref 31.5–35.7)
MCV: 83 fL (ref 79–97)
Platelets: 262 10*3/uL (ref 150–450)
RBC: 4.55 x10E6/uL (ref 3.77–5.28)
RDW: 13.1 % (ref 11.7–15.4)
WBC: 9.4 10*3/uL (ref 3.4–10.8)

## 2021-03-02 LAB — HIV ANTIBODY (ROUTINE TESTING W REFLEX): HIV Screen 4th Generation wRfx: NONREACTIVE

## 2021-03-02 LAB — ANTIBODY SCREEN: Antibody Screen: NEGATIVE

## 2021-03-02 LAB — GLUCOSE TOLERANCE, 2 HOURS W/ 1HR
Glucose, 1 hour: 157 mg/dL (ref 65–179)
Glucose, 2 hour: 119 mg/dL (ref 65–152)
Glucose, Fasting: 84 mg/dL (ref 65–91)

## 2021-03-02 LAB — RPR: RPR Ser Ql: NONREACTIVE

## 2021-03-07 ENCOUNTER — Telehealth: Payer: Self-pay | Admitting: Women's Health

## 2021-03-07 NOTE — Telephone Encounter (Signed)
Patient called stating that she thinks she has the Flu, Pt states she has a fever of 100.8 and body aches. Patient states she took a test to she if she had the COVID but it was negative. Patient is pregnant and would like to know what to do. Please contact pt

## 2021-03-29 ENCOUNTER — Ambulatory Visit (INDEPENDENT_AMBULATORY_CARE_PROVIDER_SITE_OTHER): Payer: 59 | Admitting: Advanced Practice Midwife

## 2021-03-29 ENCOUNTER — Ambulatory Visit (INDEPENDENT_AMBULATORY_CARE_PROVIDER_SITE_OTHER): Payer: 59

## 2021-03-29 ENCOUNTER — Other Ambulatory Visit: Payer: Self-pay

## 2021-03-29 VITALS — BP 131/98 | HR 99 | Wt 250.0 lb

## 2021-03-29 DIAGNOSIS — O403XX0 Polyhydramnios, third trimester, not applicable or unspecified: Secondary | ICD-10-CM | POA: Diagnosis not present

## 2021-03-29 DIAGNOSIS — O0993 Supervision of high risk pregnancy, unspecified, third trimester: Secondary | ICD-10-CM

## 2021-03-29 DIAGNOSIS — O10919 Unspecified pre-existing hypertension complicating pregnancy, unspecified trimester: Secondary | ICD-10-CM

## 2021-03-29 DIAGNOSIS — Z3A32 32 weeks gestation of pregnancy: Secondary | ICD-10-CM | POA: Diagnosis not present

## 2021-03-29 DIAGNOSIS — R809 Proteinuria, unspecified: Secondary | ICD-10-CM

## 2021-03-29 DIAGNOSIS — Z1389 Encounter for screening for other disorder: Secondary | ICD-10-CM

## 2021-03-29 LAB — POCT URINALYSIS DIPSTICK OB
Blood, UA: NEGATIVE
Glucose, UA: NEGATIVE
Ketones, UA: NEGATIVE
Leukocytes, UA: NEGATIVE
Nitrite, UA: NEGATIVE

## 2021-03-29 MED ORDER — NIFEDIPINE ER OSMOTIC RELEASE 90 MG PO TB24: 90.0000 mg | ORAL_TABLET | Freq: Every day | ORAL | 5 refills | Status: DC

## 2021-03-29 NOTE — Progress Notes (Signed)
[place] 32+1 wks,cephalic,BPP 8/8,FHR 140 BPM,anterior placenta gr 0,AFI 19 cm,normal ovaries,RI .61,.65,.59,.61=56%,EFW 2082 g 65%

## 2021-03-29 NOTE — Patient Instructions (Signed)
Bonnie Long, I greatly value your feedback.  If you receive a survey following your visit with us today, we appreciate you taking the time to fill it out.  Thanks, Fran Cresenzo-Dishmon, CNM   [facility] It is now [facility] ([address] [place], [place] [address]) Entrance located off of [address] Free 24/7 valet parking   Go to [email or web address] to register for FREE online childbirth classes    Call the office ([phone]) or go to [facility] if:  You begin to have strong, frequent contractions  Your water breaks.  Sometimes it is a big gush of fluid, sometimes it is just a trickle that keeps getting your panties wet or running down your legs  You have vaginal bleeding.  It is normal to have a small amount of spotting if your cervix was checked.   You don't feel your baby moving like normal.  If you don't, get you something to eat and drink and lay down and focus on feeling your baby move.  You should feel at least 10 movements in 2 hours.  If you don't, you should call the office or [facility]    Tdap Vaccine  It is recommended that you get the Tdap vaccine during the third trimester of EACH pregnancy to help protect your baby from getting pertussis (whooping cough)  27-36 weeks is the BEST time to do this so that you can pass the protection on to your baby. During pregnancy is better than after pregnancy, but if you are unable to get it during pregnancy it will be offered at the hospital.   You will be offered this vaccine in the office after 27 weeks. If you do not have health insurance, you can get this vaccine at the health department or your family doctor  Everyone who will be around your baby should also be up-to-date on their vaccines. Adults (who are not pregnant) only need 1 dose of Tdap during adulthood.   Third Trimester of Pregnancy The third trimester is from week 29 through week 42, months 7 through 9.  The third trimester is a time when the fetus is growing rapidly. At the end of the ninth month, the fetus is about 20 inches in length and weighs 6-10 pounds.  BODY CHANGES Your body goes through many changes during pregnancy. The changes vary from woman to woman.   Your weight will continue to increase. You can expect to gain 25-35 pounds (11-16 kg) by the end of the pregnancy.  You may begin to get stretch marks on your hips, abdomen, and breasts.  You may urinate more often because the fetus is moving lower into your pelvis and pressing on your bladder.  You may develop or continue to have heartburn as a result of your pregnancy.  You may develop constipation because certain hormones are causing the muscles that push waste through your intestines to slow down.  You may develop hemorrhoids or swollen, bulging veins (varicose veins).  You may have pelvic pain because of the weight gain and pregnancy hormones relaxing your joints between the bones in your pelvis. Backaches may result from overexertion of the muscles supporting your posture.  You may have changes in your hair. These can include thickening of your hair, rapid growth, and changes in texture. Some women also have hair loss during or after pregnancy, or hair that feels dry or thin. Your hair will most likely return to   yellow discharge may leak from your breasts called colostrum. Your belly button may stick out. You may feel short of breath because of your expanding uterus. You may notice the fetus "dropping," or moving lower in your abdomen. You may have a bloody mucus discharge. This usually occurs a few days to a week before labor begins. Your cervix becomes thin and soft (effaced) near your due date. WHAT TO EXPECT AT YOUR PRENATAL EXAMS  You will have prenatal exams every 2 weeks until week 36. Then, you will have weekly prenatal exams. During a routine prenatal  visit: You will be weighed to make sure you and the fetus are growing normally. Your blood pressure is taken. Your abdomen will be measured to track your baby's growth. The fetal heartbeat will be listened to. Any test results from the previous visit will be discussed. You may have a cervical check near your due date to see if you have effaced. At around 36 weeks, your caregiver will check your cervix. At the same time, your caregiver will also perform a test on the secretions of the vaginal tissue. This test is to determine if a type of bacteria, Group B streptococcus, is present. Your caregiver will explain this further. Your caregiver may ask you: What your birth plan is. How you are feeling. If you are feeling the baby move. If you have had any abnormal symptoms, such as leaking fluid, bleeding, severe headaches, or abdominal cramping. If you have any questions. Other tests or screenings that may be performed during your third trimester include: Blood tests that check for low iron levels (anemia). Fetal testing to check the health, activity level, and growth of the fetus. Testing is done if you have certain medical conditions or if there are problems during the pregnancy. FALSE LABOR You may feel small, irregular contractions that eventually go away. These are called Braxton Hicks contractions, or false labor. Contractions may last for hours, days, or even weeks before true labor sets in. If contractions come at regular intervals, intensify, or become painful, it is best to be seen by your caregiver.  SIGNS OF LABOR  Menstrual-like cramps. Contractions that are 5 minutes apart or less. Contractions that start on the top of the uterus and spread down to the lower abdomen and back. A sense of increased pelvic pressure or back pain. A watery or bloody mucus discharge that comes from the vagina. If you have any of these signs before the 37th week of pregnancy, call your caregiver right away.  You need to go to the hospital to get checked immediately. HOME CARE INSTRUCTIONS  Avoid all smoking, herbs, alcohol, and unprescribed drugs. These chemicals affect the formation and growth of the baby. Follow your caregiver's instructions regarding medicine use. There are medicines that are either safe or unsafe to take during pregnancy. Exercise only as directed by your caregiver. Experiencing uterine cramps is a good sign to stop exercising. Continue to eat regular, healthy meals. Wear a good support bra for breast tenderness. Do not use hot tubs, steam rooms, or saunas. Wear your seat belt at all times when driving. Avoid raw meat, uncooked cheese, cat litter boxes, and soil used by cats. These carry germs that can cause birth defects in the baby. Take your prenatal vitamins. Try taking a stool softener (if your caregiver approves) if you develop constipation. Eat more high-fiber foods, such as fresh vegetables or fruit and whole grains. Drink plenty of fluids to keep your urine clear or pale yellow.  Take warm sitz baths to soothe any pain or discomfort caused by hemorrhoids. Use hemorrhoid cream if your caregiver approves. If you develop varicose veins, wear support hose. Elevate your feet for 15 minutes, 3-4 times a day. Limit salt in your diet. Avoid heavy lifting, wear low heal shoes, and practice good posture. Rest a lot with your legs elevated if you have leg cramps or low back pain. Visit your dentist if you have not gone during your pregnancy. Use a soft toothbrush to brush your teeth and be gentle when you floss. A sexual relationship may be continued unless your caregiver directs you otherwise. Do not travel far distances unless it is absolutely necessary and only with the approval of your caregiver. Take prenatal classes to understand, practice, and ask questions about the labor and delivery. Make a trial run to the hospital. Pack your hospital bag. Prepare the baby's  nursery. Continue to go to all your prenatal visits as directed by your caregiver. SEEK MEDICAL CARE IF: You are unsure if you are in labor or if your water has broken. You have dizziness. You have mild pelvic cramps, pelvic pressure, or nagging pain in your abdominal area. You have persistent nausea, vomiting, or diarrhea. You have a bad smelling vaginal discharge. You have pain with urination. SEEK IMMEDIATE MEDICAL CARE IF:  You have a fever. You are leaking fluid from your vagina. You have spotting or bleeding from your vagina. You have severe abdominal cramping or pain. You have rapid weight loss or gain. You have shortness of breath with chest pain. You notice sudden or extreme swelling of your face, hands, ankles, feet, or legs. You have not felt your baby move in over an hour. You have severe headaches that do not go away with medicine. You have vision changes. Document Released: 09/10/2001 Document Revised: 09/21/2013 Document Reviewed: 11/17/2012 [facility] Patient Information 2015 [place], [place]. This information is not intended to replace advice given to you by your health care provider. Make sure you discuss any questions you have with your health care provider.

## 2021-03-29 NOTE — Addendum Note (Signed)
Addended by: Jacklyn Shell on: 03/29/2021 07:24 PM   Modules accepted: Orders

## 2021-03-29 NOTE — Progress Notes (Signed)
HIGH-RISK PREGNANCY VISIT Patient name: Bonnie Long MRN [id number]  Date of birth: Jul 26, 1996 Chief Complaint:   Routine Prenatal Visit, High Risk Gestation, and Pregnancy Ultrasound  History of Present Illness:   Bonnie Long is a 25 y.o. G24P0000 female at [redacted]w[redacted]d with an Estimated Date of Delivery: 05/23/21 being seen today for ongoing management of a high-risk pregnancy complicated by [facility] currently on procardia xl 60mg /day Today she reports no complaints. Contractions: Not present. Vag. Bleeding: None.  Movement: Present. denies leaking of fluid.  Review of Systems:   Pertinent items are noted in HPI Denies abnormal vaginal discharge w/ itching/odor/irritation, headaches, visual changes, shortness of breath, chest pain, abdominal pain, severe nausea/vomiting, or problems with urination or bowel movements unless otherwise stated above. Pertinent History Reviewed:  Reviewed past medical,surgical, social, obstetrical and family history.  Reviewed problem list, medications and allergies. Physical Assessment:   Vitals:   03/29/21 1035  BP: (!) 131/98  Pulse: 99  Weight: 250 lb (113.4 kg)  Body mass index is 44.29 kg/m (pended).           Physical Examination:   General appearance: alert, well appearing, and in no distress  Mental status: alert, oriented to person, place, and time  Skin: warm & dry   Extremities: Edema: None    Cardiovascular: normal heart rate noted  Respiratory: normal respiratory effort, no distress  Abdomen: gravid, soft, non-tender  Pelvic: Cervical exam deferred         Fetal Status:     Movement: Present    Fetal Surveillance Testing today: 03/31/21 32+1 wks,cephalic,BPP 8/8,FHR 140 BPM,anterior placenta gr 0,AFI 19 cm,normal ovaries,RI .61,.65,.59,.61=56%,EFW 2082 g 65%   Results for orders placed or performed in visit on 03/29/21 (from the past 24 hour(s))  POC Urinalysis Dipstick OB   Collection Time: 03/29/21 10:39 AM  Result Value Ref Range    Color, UA     Clarity, UA     Glucose, UA Negative Negative   Bilirubin, UA     Ketones, UA neg    Spec Grav, UA     Blood, UA neg    pH, UA     POC,PROTEIN,UA Small (1+) Negative, Trace, Small (1+), Moderate (2+), Large (3+), 4+   Urobilinogen, UA     Nitrite, UA neg    Leukocytes, UA Negative Negative   Appearance     Odor      Assessment & Plan:  1) High-risk pregnancy G1P0000 at [redacted]w[redacted]d with an Estimated Date of Delivery: 05/23/21   2) CHTN, take a few BPs at  home today and le tme know what they are (will make med adjustment if needed) .Treatment plan  twice weekly testing  3) proteinuria, culture urine; if neg and still present on 7/5, do 24 hour   Meds: No orders of the defined types were placed in this encounter.   Labs/procedures today: BPP/dopplers/EFW   Reviewed: Preterm labor symptoms and general obstetric precautions including but not limited to vaginal bleeding, contractions, leaking of fluid and fetal movement were reviewed in detail with the patient.  All questions were answered.   Follow-up: No follow-ups on file.  Future Appointments  Date Time Provider Department Center  04/03/2021 10:50 AM [facility] NURSE [facility]  04/05/2021  9:15 AM [facility] 06/06/2021 [facility] None  04/05/2021 10:10 AM 06/06/2021, CNM [facility]  04/09/2021 11:10 AM [facility] NURSE [facility]  04/12/2021  9:15 AM [facility] 04/14/2021 [facility] None  04/12/2021  10:10 AM Jacklyn Shell, CNM [facility]  04/16/2021 10:10 AM [facility] NURSE [facility]  04/19/2021  9:00 AM [facility] [place] [facility] None  04/19/2021 10:10 AM Cheral Marker, CNM [facility]  04/23/2021 10:30 AM [facility] NURSE [facility]  04/26/2021  9:15 AM [facility] [place] [facility] None  04/26/2021 10:10 AM Myna Hidalgo, DO [facility]  04/30/2021 10:30 AM [facility] NURSE [facility]  05/03/2021  9:00 AM [facility] [place] [facility] None  05/03/2021  9:50 AM Cheral Marker,  CNM [facility]  05/07/2021 10:30 AM [facility] NURSE [facility]  05/10/2021  9:00 AM [facility] [place] [facility] None  05/10/2021  9:50 AM Eure, Amaryllis Dyke, MD [facility]    Orders Placed This Encounter  Procedures   Urine Culture   POC Urinalysis Dipstick OB   Malike Foglio Cresenzo-Dishmon DNP, CNM 03/29/2021 10:50 AM   7:17 PM BPs 135/95 and 140/92.  Will increase to 90mg /day (max dosage)

## 2021-03-31 LAB — URINE CULTURE

## 2021-04-03 ENCOUNTER — Ambulatory Visit (INDEPENDENT_AMBULATORY_CARE_PROVIDER_SITE_OTHER): Payer: 59 | Admitting: *Deleted

## 2021-04-03 ENCOUNTER — Other Ambulatory Visit: Payer: Self-pay

## 2021-04-03 VITALS — BP 127/87 | HR 89 | Wt 251.6 lb

## 2021-04-03 DIAGNOSIS — O0993 Supervision of high risk pregnancy, unspecified, third trimester: Secondary | ICD-10-CM

## 2021-04-03 DIAGNOSIS — I10 Essential (primary) hypertension: Secondary | ICD-10-CM | POA: Diagnosis not present

## 2021-04-03 LAB — POCT URINALYSIS DIPSTICK OB
Blood, UA: NEGATIVE
Glucose, UA: NEGATIVE
Ketones, UA: NEGATIVE
Leukocytes, UA: NEGATIVE
Nitrite, UA: NEGATIVE
POC,PROTEIN,UA: NEGATIVE

## 2021-04-03 NOTE — Progress Notes (Addendum)
130  NURSE VISIT- NST  SUBJECTIVE:  Bonnie Long is a 25 y.o. G1P0000 female at [redacted]w[redacted]d, here for a NST for pregnancy complicated by [facility].  She reports active fetal movement, contractions: none, vaginal bleeding: none, membranes: intact.   OBJECTIVE:  BP 127/87   Pulse 89   Wt 251 lb 9.6 oz (114.1 kg)   LMP 08/16/2020 (Exact Date)   BMI (P) 44.57 kg/m   Appears well, no apparent distress  Results for orders placed or performed in visit on 04/03/21 (from the past 24 hour(s))  POC Urinalysis Dipstick OB   Collection Time: 04/03/21 11:17 AM  Result Value Ref Range   Color, UA     Clarity, UA     Glucose, UA Negative Negative   Bilirubin, UA     Ketones, UA neg    Spec Grav, UA     Blood, UA neg    pH, UA     POC,PROTEIN,UA Negative Negative, Trace, Small (1+), Moderate (2+), Large (3+), 4+   Urobilinogen, UA     Nitrite, UA neg    Leukocytes, UA Negative Negative   Appearance     Odor      NST: FHR baseline 130 bpm, Variability: moderate, Accelerations:present, Decelerations:  Absent= Cat 1/reactive Toco: none   ASSESSMENT: G1P0000 at [redacted]w[redacted]d with CHTN NST reactive  PLAN: EFM strip reviewed by Joellyn Haff, CNM, [facility]   Recommendations: keep next appointment as scheduled    Jobe Marker  04/03/2021 11:18 AM  Chart reviewed for nurse visit. Agree with plan of care.  Cheral Marker, [place] 04/03/2021 4:35 PM

## 2021-04-05 ENCOUNTER — Ambulatory Visit (INDEPENDENT_AMBULATORY_CARE_PROVIDER_SITE_OTHER): Payer: 59 | Admitting: Women's Health

## 2021-04-05 ENCOUNTER — Ambulatory Visit (INDEPENDENT_AMBULATORY_CARE_PROVIDER_SITE_OTHER): Payer: 59

## 2021-04-05 ENCOUNTER — Encounter: Payer: Self-pay | Admitting: Women's Health

## 2021-04-05 ENCOUNTER — Other Ambulatory Visit: Payer: Self-pay

## 2021-04-05 VITALS — BP 134/95 | HR 103 | Wt 249.4 lb

## 2021-04-05 DIAGNOSIS — O0993 Supervision of high risk pregnancy, unspecified, third trimester: Secondary | ICD-10-CM | POA: Diagnosis not present

## 2021-04-05 DIAGNOSIS — Z3A33 33 weeks gestation of pregnancy: Secondary | ICD-10-CM | POA: Diagnosis not present

## 2021-04-05 DIAGNOSIS — O10919 Unspecified pre-existing hypertension complicating pregnancy, unspecified trimester: Secondary | ICD-10-CM

## 2021-04-05 DIAGNOSIS — I10 Essential (primary) hypertension: Secondary | ICD-10-CM

## 2021-04-05 DIAGNOSIS — O403XX0 Polyhydramnios, third trimester, not applicable or unspecified: Secondary | ICD-10-CM

## 2021-04-05 LAB — POCT URINALYSIS DIPSTICK OB
Blood, UA: NEGATIVE
Glucose, UA: NEGATIVE
Ketones, UA: NEGATIVE
Leukocytes, UA: NEGATIVE
Nitrite, UA: NEGATIVE
POC,PROTEIN,UA: NEGATIVE

## 2021-04-05 NOTE — Patient Instructions (Signed)
Bonnie Long, thank you for choosing our office today! We appreciate the opportunity to meet your healthcare needs. You may receive a short survey by mail, e-mail, or through [employer]. If you are happy with your care we would appreciate if you could take just a few minutes to complete the survey questions. We read all of your comments and take your feedback very seriously. Thank you again for choosing our office.  Center for [employer] Team at [facility]  [facility] at [facility] ([address] [place], [place] [address]) Entrance C, located off of [address] Free 24/7 valet parking   CLASSES: Go to [email or web address] to register for classes (childbirth, breastfeeding, waterbirth, infant CPR, daddy bootcamp, etc.)  Call the office [phone]) or go to [facility] if: You begin to have strong, frequent contractions Your water breaks.  Sometimes it is a big gush of fluid, sometimes it is just a trickle that keeps getting your panties wet or running down your legs You have vaginal bleeding.  It is normal to have a small amount of spotting if your cervix was checked.  You don't feel your baby moving like normal.  If you don't, get you something to eat and drink and lay down and focus on feeling your baby move.   If your baby is still not moving like normal, you should call the office or go to [facility].  Call the office [phone]) or go to [facility] hospital for these signs of pre-eclampsia: Severe headache that does not go away with Tylenol Visual changes- seeing spots, double, blurred vision Pain under your right breast or upper abdomen that does not go away with Tums or heartburn medicine Nausea and/or vomiting Severe swelling in your hands, feet, and face   Tdap Vaccine It is recommended that you get the Tdap vaccine during the third trimester of EACH pregnancy to help protect your baby from getting pertussis (whooping cough) 27-36 weeks is the BEST time to do  this so that you can pass the protection on to your baby. During pregnancy is better than after pregnancy, but if you are unable to get it during pregnancy it will be offered at the hospital.  You can get this vaccine with [place], at the health department, your family doctor, or some local pharmacies Everyone who will be around your baby should also be up-to-date on their vaccines before the baby comes. Adults (who are not pregnant) only need 1 dose of Tdap during adulthood.   [facility] Pediatricians/Family Doctors [facility]): [address] Colette Ribas, [phone]           [facility]: [address], [phone]                [facility]): [address], [phone] (call to ask if accepting patients) [facility] Department: [address], [place], [phone]    [facility] Pediatricians/Family Doctors [facility]): [phone], [phone] [facility]: [address] [place], [phone] [facility] [place]: [address], [phone]  [facility]  [facility]): [phone] [facility]: [address] [phone]   [facility] Doctors [facility]: [address] [phone]  [facility] Family Doctors  [facility]: [phone], [phone]  Home Blood Pressure Monitoring for Patients   Your provider has recommended that you check your  blood pressure (BP) at least once a week at home. If you do not have a blood pressure cuff at home, one will be provided for you. Contact your provider if you have not received your monitor within 1 week.   Helpful Tips for Accurate Home Blood Pressure Checks  Don't smoke, exercise, or drink caffeine 30 minutes before checking your BP Use the restroom before checking your BP (a full bladder can raise your  pressure) Relax in a comfortable upright chair Feet on the ground Left arm resting comfortably on a flat surface at the level of your heart Legs uncrossed Back supported Sit quietly and don't talk Place the cuff on your bare arm Adjust snuggly, so that only two fingertips can fit between your skin and the top of the cuff Check 2 readings separated by at least one minute Keep a log of your BP readings For a visual, please reference this diagram: http://ccnc.care/bpdiagram  Provider Name: [facility]     Phone: [phone]  Zone 1: ALL CLEAR  Continue to monitor your symptoms:  BP reading is less than 140 (top number) or less than 90 (bottom number)  No right upper stomach pain No headaches or seeing spots No feeling nauseated or throwing up No swelling in face and hands  Zone 2: CAUTION Call your doctor's office for any of the following:  BP reading is greater than 140 (top number) or greater than 90 (bottom number)  Stomach pain under your ribs in the middle or right side Headaches or seeing spots Feeling nauseated or throwing up Swelling in face and hands  Zone 3: EMERGENCY  Seek immediate medical care if you have any of the following:  BP reading is greater than160 (top number) or greater than 110 (bottom number) Severe headaches not improving with Tylenol Serious difficulty catching your breath Any worsening symptoms from Zone 2  Preterm Labor and Birth Information  The normal length of a pregnancy is 39-41 weeks. Preterm labor is when labor starts before 37 completed weeks of pregnancy. What are the risk factors for preterm labor? Preterm labor is more likely to occur in women who: Have certain infections during pregnancy such as a bladder infection, sexually transmitted infection, or infection inside the uterus (chorioamnionitis). Have a shorter-than-normal cervix. Have gone into preterm labor before. Have had surgery on their cervix. Are younger than age 17  or older than age 35. Are African American. Are pregnant with twins or multiple babies (multiple gestation). Take street drugs or smoke while pregnant. Do not gain enough weight while pregnant. Became pregnant shortly after having been pregnant. What are the symptoms of preterm labor? Symptoms of preterm labor include: Cramps similar to those that can happen during a menstrual period. The cramps may happen with diarrhea. Pain in the abdomen or lower back. Regular uterine contractions that may feel like tightening of the abdomen. A feeling of increased pressure in the pelvis. Increased watery or bloody mucus discharge from the vagina. Water breaking (ruptured amniotic sac). Why is it important to recognize signs of preterm labor? It is important to recognize signs of preterm labor because babies who are born prematurely may not be fully developed. This can put them at an increased risk for: Long-term (chronic) heart and lung problems. Difficulty immediately after birth with regulating body systems, including blood sugar, body temperature, heart rate, and breathing rate. Bleeding in the brain. Cerebral palsy. Learning difficulties. Death. These risks are highest for babies who are born before 34 weeks   of pregnancy. How is preterm labor treated? Treatment depends on the length of your pregnancy, your condition, and the health of your baby. It may involve: Having a stitch (suture) placed in your cervix to prevent your cervix from opening too early (cerclage). Taking or being given medicines, such as: Hormone medicines. These may be given early in pregnancy to help support the pregnancy. Medicine to stop contractions. Medicines to help mature the baby's lungs. These may be prescribed if the risk of delivery is high. Medicines to prevent your baby from developing cerebral palsy. If the labor happens before 34 weeks of pregnancy, you may need to stay in the hospital. What should I do if I  think I am in preterm labor? If you think that you are going into preterm labor, call your health care provider right away. How can I prevent preterm labor in future pregnancies? To increase your chance of having a full-term pregnancy: Do not use any tobacco products, such as cigarettes, chewing tobacco, and e-cigarettes. If you need help quitting, ask your health care provider. Do not use street drugs or medicines that have not been prescribed to you during your pregnancy. Talk with your health care provider before taking any herbal supplements, even if you have been taking them regularly. Make sure you gain a healthy amount of weight during your pregnancy. Watch for infection. If you think that you might have an infection, get it checked right away. Make sure to tell your health care provider if you have gone into preterm labor before. This information is not intended to replace advice given to you by your health care provider. Make sure you discuss any questions you have with your health care provider. Document Revised: 01/08/2019 Document Reviewed: 02/07/2016 Elsevier Patient Education  2020 Elsevier Inc.   

## 2021-04-05 NOTE — Progress Notes (Signed)
[place] 33+1 wks,cephalic,BPP 8/8,CX 3.6 cm,AFI 18 cm,FHR 152 bpm,RI .62,.56,.60=36%,anterior placenta gr 0

## 2021-04-05 NOTE — Progress Notes (Signed)
HIGH-RISK PREGNANCY VISIT Patient name: Bonnie Long MRN [id number]  Date of birth: [birth year] Chief Complaint:   Routine Prenatal Visit (BPP)  History of Present Illness:   Bonnie Long is a 25 y.o. G34P0000 female at [redacted]w[redacted]d with an Estimated Date of Delivery: 05/23/21 being seen today for ongoing management of a high-risk pregnancy complicated by chronic hypertension currently on nifedipine 90mg  daily.    Today she reports no complaints. Denies ha, visual changes, ruq/epigastric pain, n/v.  Takes bp daily at home, 130s/80s.  Contractions: Not present. Vag. Bleeding: None.  Movement: Present. denies leaking of fluid.   Depression screen [facility] 2/9 11/08/2020 11/08/2020 08/04/2020 04/10/2020 04/10/2020  Decreased Interest 0 0 0 0 0  Down, Depressed, Hopeless 0 0 0 0 0  PHQ - 2 Score 0 0 0 0 0  Altered sleeping 2 - 0 - -  Tired, decreased energy 2 - 1 - -  Change in appetite 0 - 0 - -  Feeling bad or failure about yourself  0 - 0 - -  Trouble concentrating 0 - 0 - -  Moving slowly or fidgety/restless 0 - 0 - -  Suicidal thoughts 0 - 0 - -  PHQ-9 Score 4 - 1 - -     GAD 7 : Generalized Anxiety Score 11/08/2020 08/04/2020  Nervous, Anxious, on Edge 0 1  Control/stop worrying 0 1  Worry too much - different things 0 1  Trouble relaxing 0 0  Restless 0 0  Easily annoyed or irritable 0 1  Afraid - awful might happen 0 0  Total GAD 7 Score 0 4     Review of Systems:   Pertinent items are noted in HPI Denies abnormal vaginal discharge w/ itching/odor/irritation, headaches, visual changes, shortness of breath, chest pain, abdominal pain, severe nausea/vomiting, or problems with urination or bowel movements unless otherwise stated above. Pertinent History Reviewed:  Reviewed past medical,surgical, social, obstetrical and family history.  Reviewed problem list, medications and allergies. Physical Assessment:   Vitals:   04/05/21 0956  BP: (!) 134/95  Pulse: (!) 103  Weight: 249 lb 6.4  oz (113.1 kg)  Body mass index is 44.18 kg/m (pended).           Physical Examination:   General appearance: alert, well appearing, and in no distress  Mental status: alert, oriented to person, place, and time  Skin: warm & dry   Extremities: Edema: None    Cardiovascular: normal heart rate noted  Respiratory: normal respiratory effort, no distress  Abdomen: gravid, soft, non-tender  Pelvic: Cervical exam deferred         Fetal Status: Fetal Heart Rate (bpm): 152 u/s   Movement: Present    Fetal Surveillance Testing today:  06/06/21 33+1 wks,cephalic,BPP 8/8,CX 3.6 cm,AFI 18 cm,FHR 152 bpm,RI .62,.56,.60=36%,anterior placenta gr 0  Chaperone: N/A    Results for orders placed or performed in visit on 04/05/21 (from the past 24 hour(s))  POC Urinalysis Dipstick OB   Collection Time: 04/05/21  9:59 AM  Result Value Ref Range   Color, UA     Clarity, UA     Glucose, UA Negative Negative   Bilirubin, UA     Ketones, UA neg    Spec Grav, UA     Blood, UA neg    pH, UA     POC,PROTEIN,UA Negative Negative, Trace, Small (1+), Moderate (2+), Large (3+), 4+   Urobilinogen, UA     Nitrite, UA neg  Leukocytes, UA Negative Negative   Appearance     Odor      Assessment & Plan:  High-risk pregnancy: G1P0000 at [redacted]w[redacted]d with an Estimated Date of Delivery: 05/23/21   1) CHTN, stable on nifedipine 90mg  daily, ASA. Asymptomatic, no proteinuria, home bp's 130s/80s. Reviewed pre-e s/s, reasons to seek care.   2) Resolved polyhydramnios, 27.8 @ 28wks, 18 today  Meds: No orders of the defined types were placed in this encounter.   Labs/procedures today: U/S  Treatment Plan:  Growth u/s q 4wks    2x/wk testing nst/sono      Deliver 38-39wks (37wks or prn if poor control)____   Reviewed: Preterm labor symptoms and general obstetric precautions including but not limited to vaginal bleeding, contractions, leaking of fluid and fetal movement were reviewed in detail with the patient.  All questions  were answered. Does have home bp cuff. Office bp cuff given: not applicable. Check bp daily, let know if consistently >160 and/or >110.  Follow-up: Return for As scheduled.   Future Appointments  Date Time Provider Department Center  04/09/2021 11:10 AM [facility] NURSE [facility]  04/12/2021  9:15 AM [facility] 04/14/2021 [facility] None  04/12/2021 10:10 AM 04/14/2021, CNM [facility]  04/16/2021 10:10 AM [facility] NURSE [facility]  04/19/2021  9:00 AM [facility] 04/21/2021 [facility] None  04/19/2021 10:10 AM 04/21/2021, CNM [facility]  04/23/2021 10:30 AM [facility] NURSE [facility]  04/26/2021  9:15 AM [facility] 04/28/2021 [facility] None  04/26/2021 10:10 AM 04/28/2021, DO [facility]  04/30/2021 10:30 AM [facility] NURSE [facility]  05/03/2021  9:00 AM [facility] 07/03/2021 [facility] None  05/03/2021  9:50 AM 07/03/2021, CNM [facility]  05/07/2021 10:30 AM [facility] NURSE [facility]  05/10/2021  9:00 AM [facility] 07/10/2021 [facility] None  05/10/2021  9:50 AM Eure, 07/10/2021, MD [facility]    Orders Placed This Encounter  Procedures   POC Urinalysis Dipstick OB   Amaryllis Dyke CNM, [facility] 04/05/2021 10:32 AM

## 2021-04-06 DIAGNOSIS — Z029 Encounter for administrative examinations, unspecified: Secondary | ICD-10-CM

## 2021-04-09 ENCOUNTER — Other Ambulatory Visit: Payer: Self-pay

## 2021-04-09 ENCOUNTER — Ambulatory Visit (INDEPENDENT_AMBULATORY_CARE_PROVIDER_SITE_OTHER): Payer: 59 | Admitting: *Deleted

## 2021-04-09 VITALS — BP 136/82 | HR 83 | Wt 250.8 lb

## 2021-04-09 DIAGNOSIS — O0993 Supervision of high risk pregnancy, unspecified, third trimester: Secondary | ICD-10-CM

## 2021-04-09 NOTE — Progress Notes (Addendum)
   NURSE VISIT- NST  SUBJECTIVE:  Bonnie Long is a 25 y.o. G1P0000 female at [redacted]w[redacted]d, here for a NST for pregnancy complicated by [facility]  She reports active fetal movement, contractions: none, vaginal bleeding: none, membranes: intact.   OBJECTIVE:  BP 136/82   Pulse 83   Wt 250 lb 12.8 oz (113.8 kg)   LMP 08/16/2020 (Exact Date)   BMI (P) 44.43 kg/m   Appears well, no apparent distress  No results found for this or any previous visit (from the past 24 hour(s)).  NST: FHR baseline 140 bpm, Variability: moderate, Accelerations:present, Decelerations:  Absent= Cat 1/reactive Toco: none   ASSESSMENT: G1P0000 at [redacted]w[redacted]d with CHTN NST reactive  PLAN: EFM strip reviewed by Dr. Despina Hidden   Recommendations: keep next appointment as scheduled    Annamarie Dawley  04/09/2021 4:05 PM   Attestation of Attending Supervision of Nursing Visit Encounter: Evaluation and management procedures were performed by the nursing staff under my supervision and collaboration.  I have reviewed the nurse's note and chart, and I agree with the management and plan.  Rockne Coons MD Attending Physician for the Center for [facility] 04/10/2021 10:33 AM

## 2021-04-12 ENCOUNTER — Ambulatory Visit (INDEPENDENT_AMBULATORY_CARE_PROVIDER_SITE_OTHER): Payer: 59

## 2021-04-12 ENCOUNTER — Other Ambulatory Visit: Payer: Self-pay

## 2021-04-12 ENCOUNTER — Encounter: Payer: Self-pay | Admitting: Advanced Practice Midwife

## 2021-04-12 ENCOUNTER — Ambulatory Visit (INDEPENDENT_AMBULATORY_CARE_PROVIDER_SITE_OTHER): Payer: 59 | Admitting: Advanced Practice Midwife

## 2021-04-12 VITALS — BP 120/81 | Wt 251.0 lb

## 2021-04-12 DIAGNOSIS — O403XX0 Polyhydramnios, third trimester, not applicable or unspecified: Secondary | ICD-10-CM | POA: Diagnosis not present

## 2021-04-12 DIAGNOSIS — Z3A34 34 weeks gestation of pregnancy: Secondary | ICD-10-CM

## 2021-04-12 DIAGNOSIS — O0993 Supervision of high risk pregnancy, unspecified, third trimester: Secondary | ICD-10-CM

## 2021-04-12 DIAGNOSIS — O10919 Unspecified pre-existing hypertension complicating pregnancy, unspecified trimester: Secondary | ICD-10-CM

## 2021-04-12 DIAGNOSIS — Z331 Pregnant state, incidental: Secondary | ICD-10-CM

## 2021-04-12 DIAGNOSIS — Z1389 Encounter for screening for other disorder: Secondary | ICD-10-CM

## 2021-04-12 LAB — POCT URINALYSIS DIPSTICK OB
Blood, UA: NEGATIVE
Glucose, UA: NEGATIVE
Ketones, UA: NEGATIVE
Leukocytes, UA: NEGATIVE
Nitrite, UA: NEGATIVE

## 2021-04-12 NOTE — Progress Notes (Signed)
[place] 34+1 cephalic,anterior placenta gr 1,fhr 138 bpm,afi 17.2 cm,BPP 8/8,RI .64,.65,.69=85%

## 2021-04-12 NOTE — Progress Notes (Signed)
HIGH-RISK PREGNANCY VISIT Patient name: Bonnie Long MRN [id number]  Date of birth: 1996-01-26 Chief Complaint:   Routine Prenatal Visit (U/S)  History of Present Illness:   Bonnie Long is a 25 y.o. G38P0000 female at [redacted]w[redacted]d with an Estimated Date of Delivery: 05/23/21 being seen today for ongoing management of a high-risk pregnancy complicated by [facility] currently on Procardia XL 90mg  qd Today she reports no complaints. Contractions: Not present.  .  Movement: Present. denies leaking of fluid.  Review of Systems:   Pertinent items are noted in HPI Denies abnormal vaginal discharge w/ itching/odor/irritation, headaches, visual changes, shortness of breath, chest pain, abdominal pain, severe nausea/vomiting, or problems with urination or bowel movements unless otherwise stated above. Pertinent History Reviewed:  Reviewed past medical,surgical, social, obstetrical and family history.  Reviewed problem list, medications and allergies. Physical Assessment:   Vitals:   04/12/21 0947  BP: 120/81  Weight: 251 lb (113.9 kg)  Body mass index is 44.46 kg/m (pended).           Physical Examination:   General appearance: alert, well appearing, and in no distress  Mental status: alert, oriented to person, place, and time  Skin: warm & dry   Extremities: Edema: None    Cardiovascular: normal heart rate noted  Respiratory: normal respiratory effort, no distress  Abdomen: gravid, soft, non-tender  Pelvic: Cervical exam deferred         Fetal Status:     Movement: Present    Fetal Surveillance Testing today: 04/14/21 34+1 cephalic,anterior placenta gr 1,fhr 138 bpm,afi 17.2 cm,BPP 8/8,RI .64,.65,.69=85%   Results for orders placed or performed in visit on 04/12/21 (from the past 24 hour(s))  POC Urinalysis Dipstick OB   Collection Time: 04/12/21  9:51 AM  Result Value Ref Range   Color, UA     Clarity, UA     Glucose, UA Negative Negative   Bilirubin, UA     Ketones, UA neg    Spec Grav,  UA     Blood, UA neg    pH, UA     POC,PROTEIN,UA Trace Negative, Trace, Small (1+), Moderate (2+), Large (3+), 4+   Urobilinogen, UA     Nitrite, UA neg    Leukocytes, UA Negative Negative   Appearance     Odor      Assessment & Plan:  1) High-risk pregnancy G1P0000 at [redacted]w[redacted]d with an Estimated Date of Delivery: 05/23/21   2) CHTN, stable Treatment plan  ASA 162mg  [ ]     Baseline labs:      Meds: Procardia 90       Growth u/s q 4wks    2x/wk testing nst/sono   Deliver 38-39wks (37wks or prn if poor control)____    Meds: No orders of the defined types were placed in this encounter.   Labs/procedures today: BPP/dopplers   Reviewed: Preterm labor symptoms and general obstetric precautions including but not limited to vaginal bleeding, contractions, leaking of fluid and fetal movement were reviewed in detail with the patient.  All questions were answered. Has home bp cuff.  Follow-up: Return for As scheduled.  Future Appointments  Date Time Provider Department Center  04/12/2021 10:10 AM , CNM [facility]  04/16/2021 10:10 AM [facility] NURSE [facility]  04/19/2021  9:00 AM [facility] Jacklyn Shell [facility] None  04/19/2021 10:10 AM 04/21/2021, CNM [facility]  04/23/2021 10:30 AM [facility] NURSE [facility]  04/26/2021  9:15 AM [facility] - Cheral Marker  [place] [facility] None  04/26/2021 10:10 AM Myna Hidalgo, DO [facility]  04/30/2021 10:30 AM [facility] NURSE [facility]  05/03/2021  9:00 AM [facility] [place] [facility] None  05/03/2021  9:50 AM Cheral Marker, CNM [facility]  05/07/2021 10:30 AM [facility] NURSE [facility]  05/10/2021  9:00 AM [facility] [place] [facility] None  05/10/2021  9:50 AM Eure, Amaryllis Dyke, MD [facility]    Orders Placed This Encounter  Procedures  . POC Urinalysis Dipstick OB   Jacklyn Shell DNP, CNM 04/12/2021 9:57 AM

## 2021-04-12 NOTE — Patient Instructions (Signed)
Bonnie Long, I greatly value your feedback.  If you receive a survey following your visit with [place] today, we appreciate you taking the time to fill it out.  Thanks, Cathie Beams, DNP, CNM  [facility] HAS MOVED!!! It is now [facility] ([address] [place], [place] [address]) Entrance located off of [address] Free 24/7 valet parking   Go to [email or web address] to register for FREE online childbirth classes    Call the office [phone]) or go to [facility] if: You begin to have strong, frequent contractions Your water breaks.  Sometimes it is a big gush of fluid, sometimes it is just a trickle that keeps getting your panties wet or running down your legs You have vaginal bleeding.  It is normal to have a small amount of spotting if your cervix was checked.  You don't feel your baby moving like normal.  If you don't, get you something to eat and drink and lay down and focus on feeling your baby move.  You should feel at least 10 movements in 2 hours.  If you don't, you should call the office or go to [facility].   Home Blood Pressure Monitoring for Patients   Your provider has recommended that you check your blood pressure (BP) at least once a week at home. If you do not have a blood pressure cuff at home, one will be provided for you. Contact your provider if you have not received your monitor within 1 week.   Helpful Tips for Accurate Home Blood Pressure Checks  Don't smoke, exercise, or drink caffeine 30 minutes before checking your BP Use the restroom before checking your BP (a full bladder can raise your pressure) Relax in a comfortable upright chair Feet on the ground Left arm resting comfortably on a flat surface at the level of your heart Legs uncrossed Back supported Sit quietly and don't talk Place the cuff on your bare arm Adjust snuggly, so that only two fingertips can fit between your skin and the top  of the cuff Check 2 readings separated by at least one minute Keep a log of your BP readings For a visual, please reference this diagram: http://ccnc.care/bpdiagram  Provider Name: [facility]     Phone: [phone]  Zone 1: ALL CLEAR  Continue to monitor your symptoms:  BP reading is less than 140 (top number) or less than 90 (bottom number)  No right upper stomach pain No headaches or seeing spots No feeling nauseated or throwing up No swelling in face and hands  Zone 2: CAUTION Call your doctor's office for any of the following:  BP reading is greater than 140 (top number) or greater than 90 (bottom number)  Stomach pain under your ribs in the middle or right side Headaches or seeing spots Feeling nauseated or throwing up Swelling in face and hands  Zone 3: EMERGENCY  Seek immediate medical care if you have any of the following:  BP reading is greater than160 (top number) or greater than 110 (bottom number) Severe headaches not improving with Tylenol Serious difficulty catching your breath Any worsening symptoms from Zone 2

## 2021-04-16 ENCOUNTER — Ambulatory Visit (INDEPENDENT_AMBULATORY_CARE_PROVIDER_SITE_OTHER): Payer: 59 | Admitting: *Deleted

## 2021-04-16 ENCOUNTER — Other Ambulatory Visit: Payer: Self-pay

## 2021-04-16 VITALS — BP 129/91 | HR 87 | Wt 253.0 lb

## 2021-04-16 DIAGNOSIS — I10 Essential (primary) hypertension: Secondary | ICD-10-CM | POA: Diagnosis not present

## 2021-04-16 DIAGNOSIS — O0993 Supervision of high risk pregnancy, unspecified, third trimester: Secondary | ICD-10-CM

## 2021-04-16 DIAGNOSIS — Z1389 Encounter for screening for other disorder: Secondary | ICD-10-CM

## 2021-04-16 DIAGNOSIS — Z331 Pregnant state, incidental: Secondary | ICD-10-CM

## 2021-04-16 DIAGNOSIS — Z3A34 34 weeks gestation of pregnancy: Secondary | ICD-10-CM

## 2021-04-16 DIAGNOSIS — O288 Other abnormal findings on antenatal screening of mother: Secondary | ICD-10-CM

## 2021-04-16 LAB — POCT URINALYSIS DIPSTICK OB
Blood, UA: NEGATIVE
Glucose, UA: NEGATIVE
Ketones, UA: NEGATIVE
Leukocytes, UA: NEGATIVE
Nitrite, UA: NEGATIVE

## 2021-04-16 NOTE — Progress Notes (Addendum)
   NURSE VISIT- NST  SUBJECTIVE:  Bonnie Long is a 25 y.o. G1P0000 female at [redacted]w[redacted]d, here for a NST for pregnancy complicated by [facility].  She reports active fetal movement, contractions: none, vaginal bleeding: none, membranes: intact.   OBJECTIVE:  BP (!) 129/91   Pulse 87   Wt 253 lb (114.8 kg)   LMP 08/16/2020 (Exact Date)   BMI (P) 44.82 kg/m   Appears well, no apparent distress  Results for orders placed or performed in visit on 04/16/21 (from the past 24 hour(s))  POC Urinalysis Dipstick OB   Collection Time: 04/16/21 10:27 AM  Result Value Ref Range   Color, UA     Clarity, UA     Glucose, UA Negative Negative   Bilirubin, UA     Ketones, UA neg    Spec Grav, UA     Blood, UA neg    pH, UA     POC,PROTEIN,UA Trace Negative, Trace, Small (1+), Moderate (2+), Large (3+), 4+   Urobilinogen, UA     Nitrite, UA neg    Leukocytes, UA Negative Negative   Appearance     Odor      NST: FHR baseline 135 bpm, Variability: moderate, Accelerations:present, Decelerations:  Absent= Cat 1/reactive Toco: none   ASSESSMENT: G1P0000 at [redacted]w[redacted]d with CHTN NST reactive  PLAN: EFM strip reviewed by Joellyn Haff, CNM, [facility]   Recommendations: keep next appointment as scheduled    Jobe Marker  04/16/2021 4:21 PM  Chart reviewed for nurse visit. Agree with plan of care.  Cheral Marker, [place] 04/16/2021 4:31 PM

## 2021-04-19 ENCOUNTER — Encounter: Payer: Self-pay | Admitting: Women's Health

## 2021-04-19 ENCOUNTER — Other Ambulatory Visit: Payer: Self-pay

## 2021-04-19 ENCOUNTER — Ambulatory Visit (INDEPENDENT_AMBULATORY_CARE_PROVIDER_SITE_OTHER): Payer: 59 | Admitting: Women's Health

## 2021-04-19 ENCOUNTER — Ambulatory Visit (INDEPENDENT_AMBULATORY_CARE_PROVIDER_SITE_OTHER): Payer: 59

## 2021-04-19 VITALS — BP 137/98 | HR 86 | Wt 254.5 lb

## 2021-04-19 DIAGNOSIS — O0993 Supervision of high risk pregnancy, unspecified, third trimester: Secondary | ICD-10-CM

## 2021-04-19 DIAGNOSIS — O10919 Unspecified pre-existing hypertension complicating pregnancy, unspecified trimester: Secondary | ICD-10-CM

## 2021-04-19 DIAGNOSIS — O403XX0 Polyhydramnios, third trimester, not applicable or unspecified: Secondary | ICD-10-CM | POA: Diagnosis not present

## 2021-04-19 DIAGNOSIS — Z3A35 35 weeks gestation of pregnancy: Secondary | ICD-10-CM

## 2021-04-19 LAB — POCT URINALYSIS DIPSTICK OB
Blood, UA: NEGATIVE
Glucose, UA: NEGATIVE
Ketones, UA: NEGATIVE
Leukocytes, UA: NEGATIVE
Nitrite, UA: NEGATIVE

## 2021-04-19 MED ORDER — LABETALOL HCL 200 MG PO TABS: 200.0000 mg | ORAL_TABLET | Freq: Two times a day (BID) | ORAL | 3 refills | Status: DC

## 2021-04-19 NOTE — Patient Instructions (Signed)
Bonnie Long, thank you for choosing our office today! We appreciate the opportunity to meet your healthcare needs. You may receive a short survey by mail, e-mail, or through MyChart. If you are happy with your care we would appreciate if you could take just a few minutes to complete the survey questions. We read all of your comments and take your feedback very seriously. Thank you again for choosing our office.  [facility] ([address] [place], [place] [address]) Entrance C, located off of [address] Free 24/7 valet parking   CLASSES: Go to [email or web address] to register for classes (childbirth, breastfeeding, waterbirth, infant CPR, daddy bootcamp, etc.)  Call the office ([phone]) or go to [facility] if: You begin to have strong, frequent contractions Your water breaks.  Sometimes it is a big gush of fluid, sometimes it is just a trickle that keeps getting your panties wet or running down your legs You have vaginal bleeding.  It is normal to have a small amount of spotting if your cervix was checked.  You don't feel your baby moving like normal.  If you don't, get you something to eat and drink and lay down and focus on feeling your baby move.   If your baby is still not moving like normal, you should call the office or go to [facility].  Call the office ([phone]) or go to [facility] for these signs of pre-eclampsia: Severe headache that does not go away with Tylenol Visual changes- seeing spots, double, blurred vision Pain under your right breast or upper abdomen that does not go away with Tums or heartburn medicine Nausea and/or vomiting Severe swelling in your hands, feet, and face   Tdap Vaccine It is recommended that you get the Tdap vaccine during the third trimester of EACH pregnancy to help protect your baby from getting pertussis (whooping cough) 27-36 weeks is the BEST time to do  this so that you can pass the protection on to your baby. During pregnancy is better than after pregnancy, but if you are unable to get it during pregnancy it will be offered at the hospital.  You can get this vaccine with us, at the health department, your family doctor, or some local pharmacies Everyone who will be around your baby should also be up-to-date on their vaccines before the baby comes. Adults (who are not pregnant) only need 1 dose of Tdap during adulthood.   [place] Pediatricians/Family Doctors [facility] ([facility]): [address], [phone]           [facility]: [address], [phone]                [facility] ([facility]): [address], [phone] (call to ask if accepting patients) [facility]: [address], [place], [phone]    [place] Pediatricians/Family Doctors [facility] ([facility]): [address], [phone] [facility]: [address], [phone] [facility]: [address], [phone]  [place] [facility]  [facility] ([facility]): [phone] [facility]: [address], [phone]   [place] Family Doctors [facility]: [address], [phone]  [place] [facility]  [facility]: [address], [phone]  Home Blood Pressure Monitoring for Patients   Your provider has recommended that you check your   blood pressure (BP) at least once a week at home. If you do not have a blood pressure cuff at home, one will be provided for you. Contact your provider if you have not received your monitor within 1 week.   Helpful Tips for Accurate Home Blood Pressure Checks  Don't smoke, exercise, or drink caffeine 30 minutes before checking your BP Use the restroom before checking your BP (a full bladder can raise your  pressure) Relax in a comfortable upright chair Feet on the ground Left arm resting comfortably on a flat surface at the level of your heart Legs uncrossed Back supported Sit quietly and don't talk Place the cuff on your bare arm Adjust snuggly, so that only two fingertips can fit between your skin and the top of the cuff Check 2 readings separated by at least one minute Keep a log of your BP readings For a visual, please reference this diagram: http://ccnc.care/bpdiagram  Provider Name: [facility]     Phone: [phone]  Zone 1: ALL CLEAR  Continue to monitor your symptoms:  BP reading is less than 140 (top number) or less than 90 (bottom number)  No right upper stomach pain No headaches or seeing spots No feeling nauseated or throwing up No swelling in face and hands  Zone 2: CAUTION Call your doctor's office for any of the following:  BP reading is greater than 140 (top number) or greater than 90 (bottom number)  Stomach pain under your ribs in the middle or right side Headaches or seeing spots Feeling nauseated or throwing up Swelling in face and hands  Zone 3: EMERGENCY  Seek immediate medical care if you have any of the following:  BP reading is greater than160 (top number) or greater than 110 (bottom number) Severe headaches not improving with Tylenol Serious difficulty catching your breath Any worsening symptoms from Zone 2  Preterm Labor and Birth Information  The normal length of a pregnancy is 39-41 weeks. Preterm labor is when labor starts before 37 completed weeks of pregnancy. What are the risk factors for preterm labor? Preterm labor is more likely to occur in women who: Have certain infections during pregnancy such as a bladder infection, sexually transmitted infection, or infection inside the uterus (chorioamnionitis). Have a shorter-than-normal cervix. Have gone into preterm labor before. Have had surgery on their cervix. Are younger than age 17  or older than age 35. Are African American. Are pregnant with twins or multiple babies (multiple gestation). Take street drugs or smoke while pregnant. Do not gain enough weight while pregnant. Became pregnant shortly after having been pregnant. What are the symptoms of preterm labor? Symptoms of preterm labor include: Cramps similar to those that can happen during a menstrual period. The cramps may happen with diarrhea. Pain in the abdomen or lower back. Regular uterine contractions that may feel like tightening of the abdomen. A feeling of increased pressure in the pelvis. Increased watery or bloody mucus discharge from the vagina. Water breaking (ruptured amniotic sac). Why is it important to recognize signs of preterm labor? It is important to recognize signs of preterm labor because babies who are born prematurely may not be fully developed. This can put them at an increased risk for: Long-term (chronic) heart and lung problems. Difficulty immediately after birth with regulating body systems, including blood sugar, body temperature, heart rate, and breathing rate. Bleeding in the brain. Cerebral palsy. Learning difficulties. Death. These risks are highest for babies who are born before 34 weeks   of pregnancy. How is preterm labor treated? Treatment depends on the length of your pregnancy, your condition, and the health of your baby. It may involve: Having a stitch (suture) placed in your cervix to prevent your cervix from opening too early (cerclage). Taking or being given medicines, such as: Hormone medicines. These may be given early in pregnancy to help support the pregnancy. Medicine to stop contractions. Medicines to help mature the baby's lungs. These may be prescribed if the risk of delivery is high. Medicines to prevent your baby from developing cerebral palsy. If the labor happens before 34 weeks of pregnancy, you may need to stay in the hospital. What should I do if I  think I am in preterm labor? If you think that you are going into preterm labor, call your health care provider right away. How can I prevent preterm labor in future pregnancies? To increase your chance of having a full-term pregnancy: Do not use any tobacco products, such as cigarettes, chewing tobacco, and e-cigarettes. If you need help quitting, ask your health care provider. Do not use street drugs or medicines that have not been prescribed to you during your pregnancy. Talk with your health care provider before taking any herbal supplements, even if you have been taking them regularly. Make sure you gain a healthy amount of weight during your pregnancy. Watch for infection. If you think that you might have an infection, get it checked right away. Make sure to tell your health care provider if you have gone into preterm labor before. This information is not intended to replace advice given to you by your health care provider. Make sure you discuss any questions you have with your health care provider. Document Revised: 01/08/2019 Document Reviewed: 02/07/2016 Elsevier Patient Education  2020 Elsevier Inc.   

## 2021-04-19 NOTE — Progress Notes (Signed)
HIGH-RISK PREGNANCY VISIT Patient name: Bonnie Long MRN [id number]  Date of birth: 1996/03/16 Chief Complaint:   High Risk Gestation ([place] today; pain in left rib, when taking a deep breath)  History of Present Illness:   Bonnie Long is a 25 y.o. G67P0000 female at [redacted]w[redacted]d with an Estimated Date of Delivery: 05/23/21 being seen today for ongoing management of a high-risk pregnancy complicated by chronic hypertension currently on nifedipine 90mg  daily (took at 0830 this am).    Today she reports  Lt rib/side pain when taking deep breath since last night. No sob/cp.  Denies ha, visual changes, ruq/epigastric pain, n/v.  Checks home bp once/day, last checked about 2d ago- has been normal. 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 Contractions: Not present. Vag. Bleeding: None.  Movement: Present. denies leaking of fluid.   Depression screen [facility] 2/9 11/08/2020 11/08/2020 08/04/2020 04/10/2020 04/10/2020  Decreased Interest 0 0 0 0 0  Down, Depressed, Hopeless 0 0 0 0 0  PHQ - 2 Score 0 0 0 0 0  Altered sleeping 2 - 0 - -  Tired, decreased energy 2 - 1 - -  Change in appetite 0 - 0 - -  Feeling bad or failure about yourself  0 - 0 - -  Trouble concentrating 0 - 0 - -  Moving slowly or fidgety/restless 0 - 0 - -  Suicidal thoughts 0 - 0 - -  PHQ-9 Score 4 - 1 - -     GAD 7 : Generalized Anxiety Score 11/08/2020 08/04/2020  Nervous, Anxious, on Edge 0 1  Control/stop worrying 0 1  Worry too much - different things 0 1  Trouble relaxing 0 0  Restless 0 0  Easily annoyed or irritable 0 1  Afraid - awful might happen 0 0  Total GAD 7 Score 0 4     Review of Systems:   Pertinent items are noted in HPI Denies abnormal vaginal discharge w/ itching/odor/irritation, headaches, visual changes, shortness of breath, chest pain, abdominal pain, severe nausea/vomiting, or problems with urination or bowel movements unless otherwise stated above. Pertinent History Reviewed:  Reviewed past  medical,surgical, social, obstetrical and family history.  Reviewed problem list, medications and allergies. Physical Assessment:   Vitals:   04/19/21 0931 04/19/21 0934  BP: (!) 141/102 (!) 137/98  Pulse: 90 86  Weight: 254 lb 8 oz (115.4 kg)   Body mass index is 45.08 kg/m (pended).      O2 sat RA 97%, HR 82, LCTAB, HRRR      Physical Examination:   General appearance: alert, well appearing, and in no distress  Mental status: alert, oriented to person, place, and time  Skin: warm & dry   Extremities: Edema: None    Cardiovascular: normal heart rate noted  Respiratory: normal respiratory effort, no distress  Abdomen: gravid, soft, non-tender  Pelvic: Cervical exam deferred         Fetal Status: Fetal Heart Rate (bpm): 126 u/s   Movement: Present    Fetal Surveillance Testing today:  04/21/21 35+1 wks,cephalic,anterior placenta gr 2,afi 19.2 cm,BPP 8/8,FHR 126 bpm,RI .58,.59,.57=51%  Chaperone: N/A    Results for orders placed or performed in visit on 04/19/21 (from the past 24 hour(s))  POC Urinalysis Dipstick OB   Collection Time: 04/19/21 10:19 AM  Result Value Ref Range   Color, UA     Clarity, UA     Glucose, UA Negative Negative   Bilirubin, UA  Ketones, UA neg    Spec Grav, UA     Blood, UA neg    pH, UA     POC,PROTEIN,UA Trace Negative, Trace, Small (1+), Moderate (2+), Large (3+), 4+   Urobilinogen, UA     Nitrite, UA neg    Leukocytes, UA Negative Negative   Appearance     Odor      Assessment & Plan:  High-risk pregnancy: G1P0000 at [redacted]w[redacted]d with an Estimated Date of Delivery: 05/23/21   1) CHTN, unstable- discussed w/ LHE, add labetalol 200mg  BID, move IOL up to 37wks. Will check pre-e labs-advised if they come in after 2pm tomorrow I will not see them, so call MAU provider to review. Reviewed pre-e s/s, reasons to seek care. Will be at [facility] this weekend for work- if worsens to go to MAU.   2) Lt rib/side pain w/ deep inhalation, normal HR and pulse ox,  heart and lungs sound normal, no sob/cp- low suspicion for PE. If worsens, go to MAU  Meds:  Meds ordered this encounter  Medications   labetalol (NORMODYNE) 200 MG tablet    Sig: Take 1 tablet (200 mg total) by mouth 2 (two) times daily.    Dispense:  60 tablet    Refill:  3    Order Specific Question:   Supervising Provider    Answer:   [facility] [[id number]]    Labs/procedures today: U/S & pre-e labs  Treatment Plan:  EFW next week, 2x/wk testing, IOL @ 37wk (per LHE d/t worsening CHTN)  Reviewed: Preterm labor symptoms and general obstetric precautions including but not limited to vaginal bleeding, contractions, leaking of fluid and fetal movement were reviewed in detail with the patient.  All questions were answered. Does have home bp cuff. Office bp cuff given: not applicable. Check bp twice daily, let Lazaro Arms know if consistently >160 and/or >110.  Follow-up: No follow-ups on file.   Future Appointments  Date Time Provider Department Center  04/23/2021 10:30 AM [facility] NURSE [facility]  04/26/2021  9:15 AM [facility] 04/28/2021 [facility] None  04/26/2021 10:10 AM 04/28/2021, DO [facility]  04/30/2021 10:30 AM [facility] NURSE [facility]  05/03/2021  9:00 AM [facility] 07/03/2021 [facility] None  05/03/2021  9:50 AM 07/03/2021, CNM [facility]  05/07/2021 10:30 AM [facility] NURSE [facility]  05/10/2021  9:00 AM [facility] 07/10/2021 [facility] None  05/10/2021  9:50 AM [place], 07/10/2021, MD [facility]    Orders Placed This Encounter  Procedures   CBC   Comprehensive metabolic panel   Protein / creatinine ratio, urine   POC Urinalysis Dipstick OB   Amaryllis Dyke CNM, [facility] 04/19/2021 10:39 AM

## 2021-04-19 NOTE — Progress Notes (Signed)
[place] 35+1 wks,cephalic,anterior placenta gr 2,afi 19.2 cm,BPP 8/8,FHR 126 bpm,RI .58,.59,.57=51%

## 2021-04-20 ENCOUNTER — Encounter: Payer: Self-pay | Admitting: Women's Health

## 2021-04-20 DIAGNOSIS — O1490 Unspecified pre-eclampsia, unspecified trimester: Secondary | ICD-10-CM | POA: Insufficient documentation

## 2021-04-20 LAB — COMPREHENSIVE METABOLIC PANEL
ALT: 10 IU/L (ref 0–32)
AST: 12 IU/L (ref 0–40)
Albumin/Globulin Ratio: 1 — ABNORMAL LOW (ref 1.2–2.2)
Albumin: 3.6 g/dL — ABNORMAL LOW (ref 3.9–5.0)
Alkaline Phosphatase: 154 IU/L — ABNORMAL HIGH (ref 44–121)
BUN/Creatinine Ratio: 13 (ref 9–23)
BUN: 10 mg/dL (ref 6–20)
Bilirubin Total: 0.3 mg/dL (ref 0.0–1.2)
CO2: 21 mmol/L (ref 20–29)
Calcium: 9.7 mg/dL (ref 8.7–10.2)
Chloride: 100 mmol/L (ref 96–106)
Creatinine, Ser: 0.8 mg/dL (ref 0.57–1.00)
Globulin, Total: 3.5 g/dL (ref 1.5–4.5)
Glucose: 93 mg/dL (ref 65–99)
Potassium: 4.4 mmol/L (ref 3.5–5.2)
Sodium: 137 mmol/L (ref 134–144)
Total Protein: 7.1 g/dL (ref 6.0–8.5)
eGFR: 106 mL/min/{1.73_m2} (ref >59)

## 2021-04-20 LAB — CBC
Hematocrit: 34.9 % (ref 34.0–46.6)
Hemoglobin: 11.5 g/dL (ref 11.1–15.9)
MCH: 26.6 pg (ref 26.6–33.0)
MCHC: 33 g/dL (ref 31.5–35.7)
MCV: 81 fL (ref 79–97)
Platelets: 243 10*3/uL (ref 150–450)
RBC: 4.32 x10E6/uL (ref 3.77–5.28)
RDW: 13.2 % (ref 11.7–15.4)
WBC: 10.5 10*3/uL (ref 3.4–10.8)

## 2021-04-20 LAB — PROTEIN / CREATININE RATIO, URINE
Creatinine, Urine: 48.6 mg/dL
Protein, Ur: 28.6 mg/dL
Protein/Creat Ratio: 588 mg/g creat — ABNORMAL HIGH (ref 0–200)

## 2021-04-23 ENCOUNTER — Other Ambulatory Visit: Payer: Self-pay

## 2021-04-23 ENCOUNTER — Ambulatory Visit (INDEPENDENT_AMBULATORY_CARE_PROVIDER_SITE_OTHER): Payer: 59 | Admitting: *Deleted

## 2021-04-23 VITALS — BP 107/74 | HR 79 | Wt 252.0 lb

## 2021-04-23 DIAGNOSIS — I10 Essential (primary) hypertension: Secondary | ICD-10-CM

## 2021-04-23 DIAGNOSIS — O0993 Supervision of high risk pregnancy, unspecified, third trimester: Secondary | ICD-10-CM

## 2021-04-23 NOTE — Progress Notes (Signed)
   NURSE VISIT- NST  SUBJECTIVE:  Bonnie Long is a 25 y.o. G1P0000 female at [redacted]w[redacted]d, here for a NST for pregnancy complicated by [facility].  She reports active fetal movement, contractions: none, vaginal bleeding: none, membranes: intact.   OBJECTIVE:  BP 107/74   Pulse 79   Wt 252 lb (114.3 kg)   LMP 08/16/2020 (Exact Date)   BMI (P) 44.64 kg/m   Appears well, no apparent distress  No results found for this or any previous visit (from the past 24 hour(s)).  NST: FHR baseline 140 bpm, Variability: moderate, Accelerations:present, Decelerations:  Absent= Cat 1/reactive Toco: none   ASSESSMENT: G1P0000 at [redacted]w[redacted]d with CHTN NST reactive  PLAN: EFM strip reviewed by  Thressa Sheller, CNM    Recommendations: keep next appointment as scheduled    Jobe Marker  04/23/2021 12:18 PM

## 2021-04-23 NOTE — Progress Notes (Signed)
Patient was assessed and managed by nursing staff during this encounter. I have reviewed the chart and agree with the documentation and plan. I have also made any necessary editorial changes.  Thressa Sheller DNP, CNM  04/23/21  12:54 PM

## 2021-04-24 ENCOUNTER — Encounter (HOSPITAL_COMMUNITY): Payer: Self-pay | Admitting: Family Medicine

## 2021-04-24 ENCOUNTER — Inpatient Hospital Stay (HOSPITAL_COMMUNITY)
Admission: AD | Admit: 2021-04-24 | Discharge: 2021-04-24 | Disposition: A | Discharge status: Home / Self Care | Payer: 59 | Attending: Family Medicine | Admitting: Family Medicine

## 2021-04-24 DIAGNOSIS — R102 Pelvic and perineal pain unspecified side: Secondary | ICD-10-CM

## 2021-04-24 DIAGNOSIS — O26893 Other specified pregnancy related conditions, third trimester: Secondary | ICD-10-CM

## 2021-04-24 DIAGNOSIS — Z8249 Family history of ischemic heart disease and other diseases of the circulatory system: Secondary | ICD-10-CM | POA: Diagnosis not present

## 2021-04-24 DIAGNOSIS — Z79899 Other long term (current) drug therapy: Secondary | ICD-10-CM | POA: Diagnosis not present

## 2021-04-24 DIAGNOSIS — Z3A35 35 weeks gestation of pregnancy: Secondary | ICD-10-CM | POA: Diagnosis not present

## 2021-04-24 DIAGNOSIS — Z7982 Long term (current) use of aspirin: Secondary | ICD-10-CM | POA: Diagnosis not present

## 2021-04-24 DIAGNOSIS — O1493 Unspecified pre-eclampsia, third trimester: Secondary | ICD-10-CM | POA: Diagnosis not present

## 2021-04-24 DIAGNOSIS — O113 Pre-existing hypertension with pre-eclampsia, third trimester: Secondary | ICD-10-CM | POA: Diagnosis not present

## 2021-04-24 LAB — URINALYSIS, ROUTINE W REFLEX MICROSCOPIC
Bilirubin Urine: NEGATIVE
Glucose, UA: NEGATIVE mg/dL
Hgb urine dipstick: NEGATIVE
Ketones, ur: NEGATIVE mg/dL
Leukocytes,Ua: NEGATIVE
Nitrite: NEGATIVE
Protein, ur: 30 mg/dL — AB
Specific Gravity, Urine: 1.013 (ref 1.005–1.030)
pH: 5 (ref 5.0–8.0)

## 2021-04-24 NOTE — MAU Note (Signed)
First her BP was elevated, keeps going high and low.  Having really pelvic pain, started about 2 hrs ago. Pain in LUQ, started about an hour.  Pain in both areas comes and goes. No bleeding or leaking.

## 2021-04-24 NOTE — MAU Provider Note (Signed)
History   [id number]  Arrival date and time: 04/24/21 1811  Chief Complaint  Patient presents with   Abdominal Pain   Pelvic Pain   HPI  Bonnie Long is a 25 y.o. at [redacted]w[redacted]d by LMP with PMHx notable for obesity, polyhydramnios, and recent diagnosis of preeclampsia, who presents to MAU for pelvic and abdominal pain. Patient reports that her symptoms have been going on for the last week. She gets a sharp pain in the left upper part of her abdomen that lasts for a few minutes and then resolves on its own. This happens about twice per day. It worsens with certain position changes. She has not tried any medications to help with this. She has also had some pain in her lower abdomen along the groin as she has progressed through her pregnancy but this is largely unchanged. She denies dysuria, vaginal bleeding, vaginal discharge, or loss of fluid. She reports good fetal movement. She is scheduled for induction at 37 weeks due to her diagnosis of preeclampsia. She has been compliant with her Labetalol and Procardia as prescribed. Her blood pressures have been sometimes up and down but overall improved since being on two antihypertensive agents. She denies headaches, vision changes, SOB, or LE swelling.  Vaginal bleeding: No LOF: No Fetal Movement: Yes Contractions: No  A/Positive/-- (02/09 1515)  OB History     Gravida  1   Para  0   Term  0   Preterm  0   AB  0   Living  0      SAB  0   IAB  0   Ectopic  0   Multiple  0   Live Births  0           Past Medical History:  Diagnosis Date   Hypertension     Past Surgical History:  Procedure Laterality Date   NO PAST SURGERIES      Family History  Problem Relation Age of Onset   Pulmonary embolism Mother    Hypertension Maternal Grandfather     Social History   Socioeconomic History   Marital status: Married    Spouse name: Not on file   Number of children: Not on file   Years of education: Not on file    Highest education level: Not on file  Occupational History   Not on file  Tobacco Use   Smoking status: Never   Smokeless tobacco: Never  Vaping Use   Vaping Use: Never used  Substance and Sexual Activity   Alcohol use: No   Drug use: No   Sexual activity: Yes    Partners: Male    Birth control/protection: None  Other Topics Concern   Not on file  Social History Narrative   Not on file   Social Determinants of Health   Financial Resource Strain: Low Risk    Difficulty of Paying Living Expenses: Not hard at all  Food Insecurity: No Food Insecurity   Worried About Programme researcher, broadcasting/film/video in the Last Year: Never true   Ran Out of Food in the Last Year: Never true  Transportation Needs: No Transportation Needs   Lack of Transportation (Medical): No   Lack of Transportation (Non-Medical): No  Physical Activity: Insufficiently Active   Days of Exercise per Week: 2 days   Minutes of Exercise per Session: 20 min  Stress: Stress Concern Present   Feeling of Stress : To some extent  Social Connections: Socially Integrated  Frequency of Communication with Friends and Family: More than three times a week   Frequency of Social Gatherings with Friends and Family: Twice a week   Attends Religious Services: More than 4 times per year   Active Member of [employer] or Organizations: Yes   Attends Banker Meetings: 1 to 4 times per year   Marital Status: Married  Catering manager Violence: Not At Risk   Fear of Current or Ex-Partner: No   Emotionally Abused: No   Physically Abused: No   Sexually Abused: No    No Known Allergies  No current facility-administered medications on file prior to encounter.   Current Outpatient Medications on File Prior to Encounter  Medication Sig Dispense Refill   aspirin 81 MG chewable tablet Chew 2 tablets (162 mg total) by mouth daily. 60 tablet 7   labetalol (NORMODYNE) 200 MG tablet Take 1 tablet (200 mg total) by mouth 2 (two) times daily.  60 tablet 3   NIFEdipine (PROCARDIA XL) 90 MG 24 hr tablet Take 1 tablet (90 mg total) by mouth daily. 30 tablet 5   Prenatal Vit-Fe Fumarate-FA (PRENATAL VITAMIN PO) Take by mouth.       ROS Pertinent positives and negative per HPI, all others reviewed and negative  Physical Exam   BP 138/83   Pulse 78   Temp 98.8 F (37.1 C) (Oral)   Resp 18   Ht 5\' 3"  (1.6 m)   Wt 115.3 kg   LMP 08/16/2020 (Exact Date)   SpO2 98%   BMI 45.03 kg/m   Physical Exam Constitutional:      General: She is not in acute distress.    Appearance: She is well-developed.  HENT:     Head: Normocephalic and atraumatic.  Cardiovascular:     Rate and Rhythm: Normal rate and regular rhythm.     Heart sounds: No murmur heard. Pulmonary:     Effort: Pulmonary effort is normal.     Breath sounds: Normal breath sounds.  Abdominal:     General: Bowel sounds are normal.     Palpations: Abdomen is soft.     Tenderness: There is no abdominal tenderness.  Musculoskeletal:     Right lower leg: No swelling.     Left lower leg: No swelling.  Skin:    General: Skin is warm and dry.  Neurological:     Mental Status: She is alert and oriented to person, place, and time.  Psychiatric:        Mood and Affect: Mood normal.        Behavior: Behavior normal.   Cervical Exam Dilation: Closed Effacement (%): Thick Cervical Position: Posterior Exam by:: Dr. [email or web address] Baseline 135, moderate variability, + accels, - decels Toco: Irregular contractions; some uterine irritability Cat: Category 1 tracing  Labs No results found for this or any previous visit (from the past 24 hour(s)).  Imaging No results found.  MAU Course  Lab Orders  Urinalysis, Routine w reflex microscopic Urine, Clean Catch  No orders of the defined types were placed in this encounter.  Imaging Orders  No imaging studies ordered today   MDM: Patient presents with one week of intermittent abdominal/pelvic pain that is occurring  about twice per day. Located in the LUQ primarily with some increased pelvic pressure. It only lasts for a couple minutes before resolving on its own. No bleeding, LOF, discharge, or dysuria. Exam as above overall benign. Cervix is closed, thick, and posterior. Irregular contractions  with some uterine irritability on tocometer. Given that her symptoms are somewhat positional, question whether she is feeling this pain due to positioning of baby or due to intermittent contractions. UA obtained upon arrival to MAU - noninfectious appearing. No other GU symptoms to suggest vaginal infection. Overall patient is stable for discharge home. Strict return precautions reviewed including preterm labor precautions. Encouraged follow up as scheduled on 04/26/21. BP stable while in MAU (normal to mild range). NST with Category 1 tracing and reactive.  Assessment and Plan  Clinical impression: Pelvic pain in pregnancy, Preeclampsia in the 3rd trimester  Discharge home to self care   Evalina Field, MD Obstetrics Fellow  04/24/2021  6:27 PM

## 2021-04-26 ENCOUNTER — Encounter: Payer: Self-pay | Admitting: Obstetrics & Gynecology

## 2021-04-26 ENCOUNTER — Telehealth (HOSPITAL_COMMUNITY): Payer: Self-pay | Admitting: *Deleted

## 2021-04-26 ENCOUNTER — Ambulatory Visit (INDEPENDENT_AMBULATORY_CARE_PROVIDER_SITE_OTHER): Payer: 59

## 2021-04-26 ENCOUNTER — Other Ambulatory Visit: Payer: Self-pay

## 2021-04-26 ENCOUNTER — Other Ambulatory Visit (HOSPITAL_COMMUNITY)
Admission: RE | Admit: 2021-04-26 | Discharge: 2021-04-26 | Disposition: A | Discharge status: Home / Self Care | Payer: 59 | Source: Ambulatory Visit | Attending: Obstetrics & Gynecology | Admitting: Obstetrics & Gynecology

## 2021-04-26 ENCOUNTER — Ambulatory Visit (INDEPENDENT_AMBULATORY_CARE_PROVIDER_SITE_OTHER): Payer: 59 | Admitting: Obstetrics & Gynecology

## 2021-04-26 VITALS — BP 115/80 | HR 91 | Wt 253.6 lb

## 2021-04-26 DIAGNOSIS — O0993 Supervision of high risk pregnancy, unspecified, third trimester: Secondary | ICD-10-CM | POA: Insufficient documentation

## 2021-04-26 DIAGNOSIS — O119 Pre-existing hypertension with pre-eclampsia, unspecified trimester: Secondary | ICD-10-CM

## 2021-04-26 DIAGNOSIS — Z3A36 36 weeks gestation of pregnancy: Secondary | ICD-10-CM | POA: Insufficient documentation

## 2021-04-26 DIAGNOSIS — I10 Essential (primary) hypertension: Secondary | ICD-10-CM

## 2021-04-26 DIAGNOSIS — O403XX0 Polyhydramnios, third trimester, not applicable or unspecified: Secondary | ICD-10-CM | POA: Diagnosis not present

## 2021-04-26 DIAGNOSIS — O10919 Unspecified pre-existing hypertension complicating pregnancy, unspecified trimester: Secondary | ICD-10-CM | POA: Diagnosis not present

## 2021-04-26 DIAGNOSIS — H2000 Unspecified acute and subacute iridocyclitis: Secondary | ICD-10-CM

## 2021-04-26 LAB — POCT URINALYSIS DIPSTICK OB
Blood, UA: NEGATIVE
Glucose, UA: NEGATIVE
Ketones, UA: NEGATIVE
Leukocytes, UA: NEGATIVE
Nitrite, UA: NEGATIVE

## 2021-04-26 NOTE — Progress Notes (Signed)
HIGH-RISK PREGNANCY VISIT Patient name: Bonnie Long MRN [id number]  Date of birth: 07/09/1996 Chief Complaint:   Routine Prenatal Visit  History of Present Illness:   Bonnie Long is a 25 y.o. G20P0000 female at [redacted]w[redacted]d with an Estimated Date of Delivery: 05/23/21 being seen today for ongoing management of a high-risk pregnancy complicated by  -Chronic HTN with superimposed preeclampisa- no severe features  On Labetalol 200mg  bid  Procardia 90 XL daliy  Currently asymptomatic  Today she reports no complaints.   Contractions: Not present. Vag. Bleeding: None.  Movement: Present. denies leaking of fluid.   Depression screen [facility] 2/9 11/08/2020 11/08/2020 08/04/2020 04/10/2020 04/10/2020  Decreased Interest 0 0 0 0 0  Down, Depressed, Hopeless 0 0 0 0 0  PHQ - 2 Score 0 0 0 0 0  Altered sleeping 2 - 0 - -  Tired, decreased energy 2 - 1 - -  Change in appetite 0 - 0 - -  Feeling bad or failure about yourself  0 - 0 - -  Trouble concentrating 0 - 0 - -  Moving slowly or fidgety/restless 0 - 0 - -  Suicidal thoughts 0 - 0 - -  PHQ-9 Score 4 - 1 - -     Current Outpatient Medications  Medication Instructions   aspirin 162 mg, Oral, Daily   labetalol (NORMODYNE) 200 mg, Oral, 2 times daily   NIFEdipine (PROCARDIA XL) 90 mg, Oral, Daily   Prenatal Vit-Fe Fumarate-FA (PRENATAL VITAMIN PO) Oral     Review of Systems:   Pertinent items are noted in HPI Denies abnormal vaginal discharge w/ itching/odor/irritation, headaches, visual changes, shortness of breath, chest pain, abdominal pain, severe nausea/vomiting, or problems with urination or bowel movements unless otherwise stated above. Pertinent History Reviewed:  Reviewed past medical,surgical, social, obstetrical and family history.  Reviewed problem list, medications and allergies. Physical Assessment:   Vitals:   04/26/21 0947  BP: 115/80  Pulse: 91  Weight: 253 lb 9.6 oz (115 kg)  Body mass index is 44.92 kg/m.            Physical Examination:   General appearance: alert, well appearing, and in no distress  Mental status: alert, oriented to person, place, and time  Skin: warm & dry   Extremities: Edema: None    Cardiovascular: normal heart rate noted  Respiratory: normal respiratory effort, no distress  Abdomen: gravid, soft, non-tender  Pelvic: Cervical exam deferred       Vaginal swabs obtained  Fetal Status: Fetal Heart Rate (bpm): 159 u/s   Movement: Present Presentation: Vertex  Fetal Surveillance Testing today: BPP/UA doppler - cephalic,BPP 8/8,anterior placenta 2,AFI 15.6 cm,FHR 159 bpm,EFW 3073 g 73%,RI .61,.61,58.04/28/2252..57=47%  Chaperone: Marland Kitchen    Results for orders placed or performed in visit on 04/26/21 (from the past 24 hour(s))  POC Urinalysis Dipstick OB   Collection Time: 04/26/21  9:52 AM  Result Value Ref Range   Color, UA     Clarity, UA     Glucose, UA Negative Negative   Bilirubin, UA     Ketones, UA neg    Spec Grav, UA     Blood, UA neg    pH, UA     POC,PROTEIN,UA Trace Negative, Trace, Small (1+), Moderate (2+), Large (3+), 4+   Urobilinogen, UA     Nitrite, UA neg    Leukocytes, UA Negative Negative   Appearance     Odor       Assessment &  Plan:  High-risk pregnancy: G1P0000 at [redacted]w[redacted]d with an Estimated Date of Delivery: 05/23/21   1) cHTN with superimposed preeclampsia- no severe features Remains asymptomatic Continue with current medication IOL scheduled for 37wks at midnight Reviewed preeclampsia precautions F/u as scheduled for antepartum testing 8/1  Meds: No orders of the defined types were placed in this encounter.   Labs/procedures today: BPP/doppler  Treatment Plan:  continue care as outlined above  Reviewed: Preterm labor symptoms and general obstetric precautions including but not limited to vaginal bleeding, contractions, leaking of fluid and fetal movement were reviewed in detail with the patient.  All questions were answered. Pt has  home bp cuff. Check bp weekly, let us know if >140/90.   Follow-up: Return in about 1 week (around 05/03/2021) for as scheduled.   Future Appointments  Date Time Provider Department Center  04/30/2021 10:30 AM [facility] NURSE [facility]  05/03/2021 12:00 AM [facility] SCHED ROOM [facility] None  05/10/2021 10:30 AM [facility] NURSE [facility]    Orders Placed This Encounter  Procedures   Culture, beta strep (group b only)   POC Urinalysis Dipstick OB    Myna Hidalgo, DO Attending Obstetrician & Gynecologist, Faculty Practice Center for [employer], [facility]

## 2021-04-26 NOTE — Progress Notes (Addendum)
[place] 36+1 wks,cephalic,BPP 8/8,anterior placenta 2,AFI 15.6 cm,FHR 159 bpm,EFW 3073 g 73%,RI .61,.61,58.Marland Kitchen53..57=47%

## 2021-04-26 NOTE — Telephone Encounter (Signed)
Preadmission screen  

## 2021-04-27 ENCOUNTER — Telehealth (HOSPITAL_COMMUNITY): Payer: Self-pay | Admitting: *Deleted

## 2021-04-27 LAB — CERVICOVAGINAL ANCILLARY ONLY
Chlamydia: NEGATIVE
Comment: NEGATIVE
Comment: NORMAL
Neisseria Gonorrhea: NEGATIVE

## 2021-04-27 NOTE — Telephone Encounter (Signed)
Preadmission screen  

## 2021-04-29 ENCOUNTER — Other Ambulatory Visit: Payer: Self-pay | Admitting: Advanced Practice Midwife

## 2021-04-30 ENCOUNTER — Ambulatory Visit (INDEPENDENT_AMBULATORY_CARE_PROVIDER_SITE_OTHER): Payer: 59 | Admitting: *Deleted

## 2021-04-30 ENCOUNTER — Other Ambulatory Visit: Payer: Self-pay

## 2021-04-30 ENCOUNTER — Telehealth (HOSPITAL_COMMUNITY): Payer: Self-pay | Admitting: *Deleted

## 2021-04-30 ENCOUNTER — Encounter (HOSPITAL_COMMUNITY): Payer: Self-pay | Admitting: *Deleted

## 2021-04-30 DIAGNOSIS — O0993 Supervision of high risk pregnancy, unspecified, third trimester: Secondary | ICD-10-CM | POA: Diagnosis not present

## 2021-04-30 DIAGNOSIS — I10 Essential (primary) hypertension: Secondary | ICD-10-CM | POA: Diagnosis not present

## 2021-04-30 DIAGNOSIS — Z3A36 36 weeks gestation of pregnancy: Secondary | ICD-10-CM

## 2021-04-30 LAB — CULTURE, BETA STREP (GROUP B ONLY): Strep Gp B Culture: NEGATIVE

## 2021-04-30 NOTE — Telephone Encounter (Signed)
Preadmission screen  

## 2021-04-30 NOTE — Progress Notes (Addendum)
   NURSE VISIT- NST  SUBJECTIVE:  Bonnie Long is a 25 y.o. G1P0000 female at [redacted]w[redacted]d, here for a NST for pregnancy complicated by [employer].  She reports active fetal movement, contractions: none, vaginal bleeding: none, membranes: intact.   OBJECTIVE:  BP 114/81   Pulse 79   Wt 253 lb 9.6 oz (115 kg)   LMP 08/16/2020 (Exact Date)   BMI 44.92 kg/m   Appears well, no apparent distress  No results found for this or any previous visit (from the past 24 hour(s)).  NST: FHR baseline 130 bpm, Variability: moderate, Accelerations:present, Decelerations:  Absent= Cat 1/reactive Toco: none   ASSESSMENT: G1P0000 at [redacted]w[redacted]d with CHTN NST reactive  PLAN: EFM strip reviewed by Dr. Despina Hidden   Recommendations: keep next appointment as scheduled    Jobe Marker  04/30/2021 11:55 AM  Attestation of Attending Supervision of Nursing Visit Encounter: Evaluation and management procedures were performed by the nursing staff under my supervision and collaboration.  I have reviewed the nurse's note and chart, and I agree with the management and plan.  Rockne Coons MD Attending Physician for the Center for [facility] 04/30/2021 2:12 PM

## 2021-05-01 ENCOUNTER — Other Ambulatory Visit: Payer: Self-pay | Admitting: Obstetrics & Gynecology

## 2021-05-01 LAB — SARS CORONAVIRUS 2 (TAT 6-24 HRS): SARS Coronavirus 2: NEGATIVE

## 2021-05-02 ENCOUNTER — Other Ambulatory Visit: Payer: Self-pay

## 2021-05-03 ENCOUNTER — Encounter (HOSPITAL_COMMUNITY): Payer: Self-pay | Admitting: Obstetrics & Gynecology

## 2021-05-03 ENCOUNTER — Inpatient Hospital Stay (HOSPITAL_COMMUNITY)
Admission: AD | Admit: 2021-05-03 | Discharge: 2021-05-09 | DRG: 786 | Disposition: A | Discharge status: Home / Self Care | Payer: 59 | Attending: Obstetrics & Gynecology | Admitting: Obstetrics & Gynecology

## 2021-05-03 ENCOUNTER — Other Ambulatory Visit: Payer: 59

## 2021-05-03 ENCOUNTER — Encounter: Payer: 59 | Admitting: Women's Health

## 2021-05-03 ENCOUNTER — Inpatient Hospital Stay (HOSPITAL_COMMUNITY): Payer: 59

## 2021-05-03 DIAGNOSIS — N179 Acute kidney failure, unspecified: Secondary | ICD-10-CM | POA: Diagnosis not present

## 2021-05-03 DIAGNOSIS — O9081 Anemia of the puerperium: Secondary | ICD-10-CM | POA: Diagnosis not present

## 2021-05-03 DIAGNOSIS — O114 Pre-existing hypertension with pre-eclampsia, complicating childbirth: Secondary | ICD-10-CM | POA: Diagnosis not present

## 2021-05-03 DIAGNOSIS — O1002 Pre-existing essential hypertension complicating childbirth: Secondary | ICD-10-CM | POA: Diagnosis not present

## 2021-05-03 DIAGNOSIS — Z3A37 37 weeks gestation of pregnancy: Secondary | ICD-10-CM

## 2021-05-03 DIAGNOSIS — O411230 Chorioamnionitis, third trimester, not applicable or unspecified: Secondary | ICD-10-CM | POA: Diagnosis not present

## 2021-05-03 DIAGNOSIS — O119 Pre-existing hypertension with pre-eclampsia, unspecified trimester: Secondary | ICD-10-CM

## 2021-05-03 DIAGNOSIS — O0990 Supervision of high risk pregnancy, unspecified, unspecified trimester: Secondary | ICD-10-CM

## 2021-05-03 DIAGNOSIS — O1410 Severe pre-eclampsia, unspecified trimester: Secondary | ICD-10-CM | POA: Diagnosis not present

## 2021-05-03 DIAGNOSIS — O43893 Other placental disorders, third trimester: Secondary | ICD-10-CM | POA: Diagnosis not present

## 2021-05-03 DIAGNOSIS — D62 Acute posthemorrhagic anemia: Secondary | ICD-10-CM | POA: Diagnosis not present

## 2021-05-03 DIAGNOSIS — O99214 Obesity complicating childbirth: Secondary | ICD-10-CM | POA: Diagnosis present

## 2021-05-03 DIAGNOSIS — Z7982 Long term (current) use of aspirin: Secondary | ICD-10-CM

## 2021-05-03 DIAGNOSIS — O411290 Chorioamnionitis, unspecified trimester, not applicable or unspecified: Secondary | ICD-10-CM | POA: Diagnosis not present

## 2021-05-03 DIAGNOSIS — O1414 Severe pre-eclampsia complicating childbirth: Secondary | ICD-10-CM | POA: Diagnosis not present

## 2021-05-03 DIAGNOSIS — O0993 Supervision of high risk pregnancy, unspecified, third trimester: Secondary | ICD-10-CM

## 2021-05-03 DIAGNOSIS — O26833 Pregnancy related renal disease, third trimester: Secondary | ICD-10-CM | POA: Diagnosis not present

## 2021-05-03 HISTORY — DX: Pre-existing hypertension with pre-eclampsia, unspecified trimester: O11.9

## 2021-05-03 LAB — COMPREHENSIVE METABOLIC PANEL
ALT: 14 U/L (ref 0–44)
AST: 19 U/L (ref 15–41)
Albumin: 2.7 g/dL — ABNORMAL LOW (ref 3.5–5.0)
Alkaline Phosphatase: 143 U/L — ABNORMAL HIGH (ref 38–126)
Anion gap: 9 (ref 5–15)
BUN: 12 mg/dL (ref 6–20)
CO2: 21 mmol/L — ABNORMAL LOW (ref 22–32)
Calcium: 9.1 mg/dL (ref 8.9–10.3)
Chloride: 103 mmol/L (ref 98–111)
Creatinine, Ser: 0.91 mg/dL (ref 0.44–1.00)
GFR, Estimated: >60 mL/min (ref >60)
Glucose, Bld: 89 mg/dL (ref 70–99)
Potassium: 3.8 mmol/L (ref 3.5–5.1)
Sodium: 133 mmol/L — ABNORMAL LOW (ref 135–145)
Total Bilirubin: 0.5 mg/dL (ref 0.3–1.2)
Total Protein: 6.7 g/dL (ref 6.5–8.1)

## 2021-05-03 LAB — CBC
HCT: 31.9 % — ABNORMAL LOW (ref 36.0–46.0)
Hemoglobin: 10.4 g/dL — ABNORMAL LOW (ref 12.0–15.0)
MCH: 26.9 pg (ref 26.0–34.0)
MCHC: 32.6 g/dL (ref 30.0–36.0)
MCV: 82.6 fL (ref 80.0–100.0)
Platelets: 236 10*3/uL (ref 150–400)
RBC: 3.86 MIL/uL — ABNORMAL LOW (ref 3.87–5.11)
RDW: 13.9 % (ref 11.5–15.5)
WBC: 9.9 10*3/uL (ref 4.0–10.5)
nRBC: 0.2 % (ref 0.0–0.2)

## 2021-05-03 LAB — RPR: RPR Ser Ql: NONREACTIVE

## 2021-05-03 MED ORDER — FENTANYL CITRATE (PF) 100 MCG/2ML IJ SOLN
50.0000 ug | INTRAMUSCULAR | Status: DC | PRN
  Administered 2021-05-05: 100 ug via INTRAVENOUS
  Filled 2021-05-03: qty 2

## 2021-05-03 MED ORDER — LACTATED RINGERS IV SOLN
INTRAVENOUS | Status: DC
Start: 2021-05-03 — End: 2021-05-06

## 2021-05-03 MED ORDER — ACETAMINOPHEN 325 MG PO TABS: 650.0000 mg | ORAL_TABLET | ORAL | Status: DC | PRN

## 2021-05-03 MED ORDER — LIDOCAINE HCL (PF) 1 % IJ SOLN: 30.0000 mL | INTRAMUSCULAR | Status: DC | PRN

## 2021-05-03 MED ORDER — OXYTOCIN BOLUS FROM INFUSION: 333.0000 mL | Freq: Once | INTRAVENOUS | Status: DC

## 2021-05-03 MED ORDER — MISOPROSTOL 50MCG HALF TABLET
50.0000 ug | ORAL_TABLET | Freq: Once | ORAL | Status: AC
  Administered 2021-05-03: 50 ug via BUCCAL

## 2021-05-03 MED ORDER — MISOPROSTOL 25 MCG QUARTER TABLET
25.0000 ug | ORAL_TABLET | ORAL | Status: DC | PRN
  Administered 2021-05-03 (×2): 25 ug via VAGINAL
  Filled 2021-05-03 (×2): qty 1

## 2021-05-03 MED ORDER — TERBUTALINE SULFATE 1 MG/ML IJ SOLN: 0.2500 mg | Freq: Once | INTRAMUSCULAR | Status: DC | PRN

## 2021-05-03 MED ORDER — ONDANSETRON HCL 4 MG/2ML IJ SOLN: 4.0000 mg | Freq: Four times a day (QID) | INTRAMUSCULAR | Status: DC | PRN

## 2021-05-03 MED ORDER — LABETALOL HCL 200 MG PO TABS
200.0000 mg | ORAL_TABLET | Freq: Two times a day (BID) | ORAL | Status: DC
  Administered 2021-05-03 – 2021-05-06 (×7): 200 mg via ORAL
  Filled 2021-05-03 (×8): qty 1

## 2021-05-03 MED ORDER — MISOPROSTOL 50MCG HALF TABLET
ORAL_TABLET | ORAL | Status: AC
  Filled 2021-05-03: qty 1

## 2021-05-03 MED ORDER — LACTATED RINGERS IV SOLN
500.0000 mL | INTRAVENOUS | Status: DC | PRN
  Administered 2021-05-03: 1000 mL via INTRAVENOUS

## 2021-05-03 MED ORDER — LABETALOL HCL 200 MG PO TABS: 200.0000 mg | ORAL_TABLET | Freq: Two times a day (BID) | ORAL | Status: DC

## 2021-05-03 MED ORDER — OXYCODONE-ACETAMINOPHEN 5-325 MG PO TABS
1.0000 | ORAL_TABLET | ORAL | Status: DC | PRN
Start: 2021-05-03 — End: 2021-05-06

## 2021-05-03 MED ORDER — MISOPROSTOL 50MCG HALF TABLET
50.0000 ug | ORAL_TABLET | Freq: Once | ORAL | Status: AC
  Administered 2021-05-03: 50 ug via BUCCAL
  Filled 2021-05-03: qty 1

## 2021-05-03 MED ORDER — SOD CITRATE-CITRIC ACID 500-334 MG/5ML PO SOLN
30.0000 mL | ORAL | Status: DC | PRN
  Filled 2021-05-03: qty 30

## 2021-05-03 MED ORDER — NIFEDIPINE ER OSMOTIC RELEASE 60 MG PO TB24
90.0000 mg | ORAL_TABLET | Freq: Every day | ORAL | Status: DC
  Administered 2021-05-03 – 2021-05-06 (×4): 90 mg via ORAL
  Filled 2021-05-03 (×6): qty 1

## 2021-05-03 MED ORDER — MISOPROSTOL 50MCG HALF TABLET
50.0000 ug | ORAL_TABLET | ORAL | Status: DC | PRN
  Administered 2021-05-03 – 2021-05-04 (×3): 50 ug via BUCCAL
  Filled 2021-05-03 (×2): qty 1

## 2021-05-03 MED ORDER — OXYCODONE-ACETAMINOPHEN 5-325 MG PO TABS: 2.0000 | ORAL_TABLET | ORAL | Status: DC | PRN

## 2021-05-03 MED ORDER — OXYTOCIN-SODIUM CHLORIDE 30-0.9 UT/500ML-% IV SOLN
2.5000 [IU]/h | INTRAVENOUS | Status: DC
  Administered 2021-05-06: 2.5 [IU]/h via INTRAVENOUS
  Filled 2021-05-03: qty 500

## 2021-05-03 NOTE — Plan of Care (Signed)
°  Problem: Education: °Goal: Ability to make informed decisions regarding treatment and plan of care will improve °Outcome: Progressing °  °Problem: Education: °Goal: Knowledge of General Education information will improve °Description: Including pain rating scale, medication(s)/side effects and non-pharmacologic comfort measures °Outcome: Progressing °  °Problem: Clinical Measurements: °Goal: Diagnostic test results will improve °Outcome: Progressing °  °

## 2021-05-03 NOTE — Progress Notes (Signed)
Labor Progress Note Bonnie Long is a 25 y.o. G1P0000 at [redacted]w[redacted]d presented for  IOL-cHTN with SIPE (on labetolol and procardia) no SF. S: Patient is resting comfortably.  O:  BP (!) 151/96   Pulse 85   Temp 98.2 F (36.8 C) (Oral)   Resp 18   Ht 5\' 3"  (1.6 m)   Wt 117.3 kg   LMP 08/16/2020 (Exact Date)   BMI 45.79 kg/m  EFM: 130 BPM/+accels/-decels/mod variability Toco: q46min  CVE: Dilation: Closed Effacement (%): Thick Station: -3 Presentation: Vertex Exam by:: Dr [email or web address]   A&P: 25 y.o. G1P0000 [redacted]w[redacted]d presenting for IOL-cHTN with SIPE (on labetolol and procardia). #Labor: Pt is still closed. Will continue with another dose of cytotec [redacted]w[redacted]d). Consider FB on next check.  #Pain: PRN #FWB: cat 1 #GBS negative #cHTN SIPE w/o SF: Labetolol 200 mg BID and procardia XL 90 mg ordered. Pt is asymptomatic. Monitor Bps- last BP: 151/96. CBC wnl. Creatinine 0.91. If it reaches 1.1 or pt has severe range Bps, will start Mag.     ([id number], MD Center for [facility], [facility] 5:53 AM

## 2021-05-03 NOTE — Progress Notes (Signed)
Bonnie Long is a 25 y.o. G1P0000 at [redacted]w[redacted]d admitted for induction of labor due to Hypertension and Pre-eclamptic toxemia of pregnancy..  Subjective: Pt resting in bed, FOB at bedside for support. Tolerated FB attempt and exams well.  Objective: BP (!) 156/99   Pulse 78   Temp 98.1 F (36.7 C) (Oral)   Resp 18   Ht 5\' 3"  (1.6 m)   Wt 117.3 kg   LMP 08/16/2020 (Exact Date)   BMI 45.79 kg/m  No intake/output data recorded. No intake/output data recorded.  FHT:  FHR: 140 bpm, variability: minimal ,  accelerations:  Present,  decelerations:  Absent UC:   regular, every 2-3 minutes SVE:   Dilation: Fingertip Effacement (%): Thick Station: -3 Exam by:: [email or web address] CNM  Labs: Lab Results  Component Value Date   WBC 9.9 05/03/2021   HGB 10.4 (L) 05/03/2021   HCT 31.9 (L) 05/03/2021   MCV 82.6 05/03/2021   PLT 236 05/03/2021   Assessment / Plan: Protracted latent phase, continue cervical ripening  Labor:  Slow cervical ripening with regular contractions, will give another dose of buccal cytotec and try for FB again at next check. Instructed pt to do rounds of pelvic tilts and walking. Intermittent monitoring ok while walking. Preeclampsia:  no signs or symptoms of toxicity, intake and ouput balanced, labs stable, and pt about to take her AM doses of labetalol and procardia Fetal Wellbeing:  Category I Pain Control:  Labor support without medications I/D:  n/a Anticipated MOD:  NSVD  07/03/2021, CNM [facility], Center for [facility]

## 2021-05-03 NOTE — Progress Notes (Signed)
Patient Vitals for the past 4 hrs:  BP Pulse Resp  05/03/21 2048 (!) 147/85 88 16   Cx still tight fingertip, thick/-3.  FHR Cat 1.  No cramping/pain.  Has been on clear liquids since admission, change to regular diet during ripening phase.  Will continue cytotec until able to place foley.

## 2021-05-03 NOTE — Progress Notes (Signed)
Bonnie Long is a 25 y.o. G1P0000 at [redacted]w[redacted]d admitted for induction of labor due to Hypertension and Pre-eclamptic toxemia of pregnancy..  Subjective: Pt bouncing on the ball, feeling her contractions more. Her doula and partner are there to support her  Objective: BP 136/80   Pulse 77   Temp 98.4 F (36.9 C) (Oral)   Resp 16   Ht 5\' 3"  (1.6 m)   Wt 117.3 kg   LMP 08/16/2020 (Exact Date)   BMI 45.79 kg/m  No intake/output data recorded. No intake/output data recorded.  FHT:  FHR: 140 bpm, variability: minimal ,  accelerations:  Present,  decelerations:  Absent UC:   regular, every 2-3 minutes SVE:   Dilation: Fingertip Effacement (%): 20 Station: -3 Exam by:: Dr. [email or web address] (resident)  Labs: Lab Results  Component Value Date   WBC 9.9 05/03/2021   HGB 10.4 (L) 05/03/2021   HCT 31.9 (L) 05/03/2021   MCV 82.6 05/03/2021   PLT 236 05/03/2021   Assessment / Plan: Protracted latent phase, continue cervical ripening  Labor:  Slow cervical ripening with regular contractions, will give a dose of vaginal cytotec and try for FB again at next check. Instructed pt to do rounds of pelvic tilts and walking. Intermittent monitoring ok while walking. Preeclampsia:  no signs or symptoms of toxicity, intake and ouput balanced, labs stable, and pt taking her home doses of labetalol and procardia Fetal Wellbeing:  Category I Pain Control:  Labor support without medications I/D:  n/a Anticipated MOD:  NSVD  07/03/2021 MD

## 2021-05-03 NOTE — H&P (Addendum)
OBSTETRIC ADMISSION HISTORY AND PHYSICAL  Bonnie Long is a 25 y.o. female G1P0000 with IUP at [redacted]w[redacted]d by LMP presenting for IOL-cHTN with SIPE (on labetolol and procardia). She reports +FMs, No LOF, no VB, no blurry vision, headaches or peripheral edema, and RUQ pain.  She plans on breast feeding. She is unsure about birth control. She received her prenatal care at [facility]   Dating: By LMP --->  Estimated Date of Delivery: 05/23/21  Sono:   04/29/21@[redacted]w[redacted]d , CWD, normal anatomy, cephalic presentation, anterior placental lie, 3073g, 73% EFW  Prenatal History/Complications:  cHTN-Labetolol and procardia Polyhydramnios, AFI(27.8) in third trimester-resolved  Past Medical History: Past Medical History:  Diagnosis Date   Hypertension     Past Surgical History: Past Surgical History:  Procedure Laterality Date   NO PAST SURGERIES      Obstetrical History: OB History     Gravida  1   Para  0   Term  0   Preterm  0   AB  0   Living  0      SAB  0   IAB  0   Ectopic  0   Multiple  0   Live Births  0           Social History Social History   Socioeconomic History   Marital status: Married    Spouse name: Not on file   Number of children: Not on file   Years of education: Not on file   Highest education level: Not on file  Occupational History   Not on file  Tobacco Use   Smoking status: Never   Smokeless tobacco: Never  Vaping Use   Vaping Use: Never used  Substance and Sexual Activity   Alcohol use: No   Drug use: No   Sexual activity: Yes    Partners: Male    Birth control/protection: None  Other Topics Concern   Not on file  Social History Narrative   Not on file   Social Determinants of Health   Financial Resource Strain: Low Risk    Difficulty of Paying Living Expenses: Not hard at all  Food Insecurity: No Food Insecurity   Worried About Programme researcher, broadcasting/film/video in the Last Year: Never true   Ran Out of Food in the Last Year: Never  true  Transportation Needs: No Transportation Needs   Lack of Transportation (Medical): No   Lack of Transportation (Non-Medical): No  Physical Activity: Insufficiently Active   Days of Exercise per Week: 2 days   Minutes of Exercise per Session: 20 min  Stress: Stress Concern Present   Feeling of Stress : To some extent  Social Connections: Press photographer of Communication with Friends and Family: More than three times a week   Frequency of Social Gatherings with Friends and Family: Twice a week   Attends Religious Services: More than 4 times per year   Active Member of [employer] or Organizations: Yes   Attends Banker Meetings: 1 to 4 times per year   Marital Status: Married    Family History: Family History  Problem Relation Age of Onset   Pulmonary embolism Mother    Hypertension Maternal Grandfather     Allergies: Not on File  Medications Prior to Admission  Medication Sig Dispense Refill Last Dose   aspirin 81 MG chewable tablet Chew 2 tablets (162 mg total) by mouth daily. 60 tablet 7 05/02/2021   labetalol (NORMODYNE) 200 MG tablet  Take 1 tablet (200 mg total) by mouth 2 (two) times daily. 60 tablet 3 05/02/2021   NIFEdipine (PROCARDIA XL) 90 MG 24 hr tablet Take 1 tablet (90 mg total) by mouth daily. 30 tablet 5 Past Week   Prenatal Vit-Fe Fumarate-FA (PRENATAL VITAMIN PO) Take by mouth.   05/02/2021     Review of Systems   All systems reviewed and negative except as stated in HPI  Height 5\' 3"  (1.6 m), weight 117.3 kg, last menstrual period 08/16/2020. General appearance: alert, cooperative, and appears stated age Lungs: clear to auscultation bilaterally Heart: regular rate and rhythm Abdomen: soft, non-tender; bowel sounds normal Pelvic: As stated below  Extremities: Homans sign is negative, no sign of DVT Presentation: cephalic Fetal monitoringBaseline: 130 bpm, Variability: Good {> 6 bpm), Accelerations: Reactive, and Decelerations:  Absent Uterine activity: Irregular q10    Prenatal labs: ABO, Rh: --/--/PENDING (08/04 0040) Antibody: PENDING (08/04 0040) Rubella: 1.35 (02/09 1515) RPR: Non Reactive (06/02 0851)  HBsAg: Negative (02/09 1515)  HIV: Non Reactive (06/02 0851)  GBS: Negative/-- (07/28 1122)  2 hr Glucola passed Genetic screening  NT/IT: neg Panorama: low risk female Anatomy 07-26-1976 nml  Prenatal Transfer Tool  Maternal Diabetes: No Genetic Screening: Normal Maternal Ultrasounds/Referrals: Normal Fetal Ultrasounds or other Referrals:  None Maternal Substance Abuse:  No Significant Maternal Medications:  Meds include: Other: Labetolol and procardia  Significant Maternal Lab Results: Group B Strep negative  Results for orders placed or performed during the hospital encounter of 05/03/21 (from the past 24 hour(s))  Type and screen [facility]   Collection Time: 05/03/21 12:40 AM  Result Value Ref Range   ABO/RH(D) PENDING    Antibody Screen PENDING    Sample Expiration      05/06/2021,2359 Performed at [facility], [address], [place], [place]   CBC   Collection Time: 05/03/21 12:50 AM  Result Value Ref Range   WBC 9.9 4.0 - 10.5 K/uL   RBC 3.86 (L) 3.87 - 5.11 MIL/uL   Hemoglobin 10.4 (L) 12.0 - 15.0 g/dL   HCT 07/03/21 (L) 71.6 - 96.7 %   MCV 82.6 80.0 - 100.0 fL   MCH 26.9 26.0 - 34.0 pg   MCHC 32.6 30.0 - 36.0 g/dL   RDW 89.3 81.0 - 17.5 %   Platelets 236 150 - 400 K/uL   nRBC 0.2 0.0 - 0.2 %    Patient Active Problem List   Diagnosis Date Noted   Chronic hypertension with superimposed preeclampsia 05/03/2021   Preeclampsia 04/20/2021   Polyhydramnios in third trimester 03/01/2021   Supervision of high-risk pregnancy 11/07/2020   Acute cyclitis 08/23/2020   Vitamin D deficiency 04/10/2020   Migraine 04/10/2020   Hypertension 02/11/2018   Obesity (BMI 30-39.9) 01/21/2018    Assessment/Plan:  Bonnie Long is a 25 y.o. G1P0000 at [redacted]w[redacted]d here  for IOL-cHTN  #IOL: Will give cytotec x1 and possibly place FB on next check.  #Pain: PRN #FWB: Cat 1 #ID:  GBS negative #MOF: Breast #MOC: Unsure #Circ:  Yes #cHTN: Labetolol 200 mg BID and procardia XL 90 mg ordered. Pt is asymptomatic.  Monitor Bps. CBC wnl.  [redacted]w[redacted]d, MD  05/03/2021, 1:38 AM  CNM attestation:  I have seen and examined this patient; I agree with above documentation in the resident's note.   Bonnie Long is a 25 y.o. G1P0000 here for IOL due to cHTN with SIPE (no SF; had P/C ratio 0.58 at  35wks)  PE: BP 134/88   Pulse 84   Ht 5\' 3"  (1.6 m)   Wt 117.3 kg   LMP 08/16/2020 (Exact Date)   BMI 45.79 kg/m  Gen: calm comfortable, NAD Resp: normal effort, no distress Abd: gravid  ROS, labs, PMH reviewed  Plan: -Admit to Labor and Delivery -Continue home antihypertensives -Plan cx ripening with cytotec to start, then cervical foley, Pit/AROM prn -Monitor serum creatinine of 0.91 on admission as well as BPs> if become severe range or if creatinine reaches 1.1 will need mag sulfate therapy -Anticipate vag del  08/18/2020 CNM 05/03/2021, 2:27 AM

## 2021-05-04 LAB — CBC
HCT: 33.1 % — ABNORMAL LOW (ref 36.0–46.0)
HCT: 34.4 % — ABNORMAL LOW (ref 36.0–46.0)
Hemoglobin: 10.8 g/dL — ABNORMAL LOW (ref 12.0–15.0)
Hemoglobin: 11.3 g/dL — ABNORMAL LOW (ref 12.0–15.0)
MCH: 26.7 pg (ref 26.0–34.0)
MCH: 26.8 pg (ref 26.0–34.0)
MCHC: 32.6 g/dL (ref 30.0–36.0)
MCHC: 32.8 g/dL (ref 30.0–36.0)
MCV: 81.7 fL (ref 80.0–100.0)
MCV: 81.7 fL (ref 80.0–100.0)
Platelets: 231 10*3/uL (ref 150–400)
Platelets: 261 10*3/uL (ref 150–400)
RBC: 4.05 MIL/uL (ref 3.87–5.11)
RBC: 4.21 MIL/uL (ref 3.87–5.11)
RDW: 13.7 % (ref 11.5–15.5)
RDW: 13.8 % (ref 11.5–15.5)
WBC: 10.8 10*3/uL — ABNORMAL HIGH (ref 4.0–10.5)
WBC: 11.8 10*3/uL — ABNORMAL HIGH (ref 4.0–10.5)
nRBC: 0 % (ref 0.0–0.2)
nRBC: 0 % (ref 0.0–0.2)

## 2021-05-04 LAB — COMPREHENSIVE METABOLIC PANEL
ALT: 15 U/L (ref 0–44)
AST: 20 U/L (ref 15–41)
Albumin: 2.6 g/dL — ABNORMAL LOW (ref 3.5–5.0)
Alkaline Phosphatase: 156 U/L — ABNORMAL HIGH (ref 38–126)
Anion gap: 8 (ref 5–15)
BUN: 9 mg/dL (ref 6–20)
CO2: 24 mmol/L (ref 22–32)
Calcium: 9.2 mg/dL (ref 8.9–10.3)
Chloride: 104 mmol/L (ref 98–111)
Creatinine, Ser: 0.99 mg/dL (ref 0.44–1.00)
GFR, Estimated: >60 mL/min (ref >60)
Glucose, Bld: 97 mg/dL (ref 70–99)
Potassium: 3.9 mmol/L (ref 3.5–5.1)
Sodium: 136 mmol/L (ref 135–145)
Total Bilirubin: 0.6 mg/dL (ref 0.3–1.2)
Total Protein: 6.7 g/dL (ref 6.5–8.1)

## 2021-05-04 LAB — PROTEIN / CREATININE RATIO, URINE
Creatinine, Urine: 65.8 mg/dL
Protein Creatinine Ratio: 0.3 mg/mg{Cre} — ABNORMAL HIGH (ref 0.00–0.15)
Total Protein, Urine: 20 mg/dL

## 2021-05-04 MED ORDER — DIPHENHYDRAMINE HCL 50 MG/ML IJ SOLN
12.5000 mg | INTRAMUSCULAR | Status: DC | PRN
Start: 2021-05-04 — End: 2021-05-06
  Administered 2021-05-06: 12.5 mg via INTRAVENOUS
  Filled 2021-05-04: qty 1

## 2021-05-04 MED ORDER — MISOPROSTOL 25 MCG QUARTER TABLET
ORAL_TABLET | ORAL | Status: AC
  Filled 2021-05-04: qty 1

## 2021-05-04 MED ORDER — LACTATED RINGERS IV SOLN
500.0000 mL | Freq: Once | INTRAVENOUS | Status: AC
  Administered 2021-05-05: 500 mL via INTRAVENOUS

## 2021-05-04 MED ORDER — EPHEDRINE 5 MG/ML INJ: 10.0000 mg | INTRAVENOUS | Status: DC | PRN

## 2021-05-04 MED ORDER — FENTANYL-BUPIVACAINE-NACL 0.5-0.125-0.9 MG/250ML-% EP SOLN
12.0000 mL/h | EPIDURAL | Status: DC | PRN
  Administered 2021-05-05: 12 mL/h via EPIDURAL
  Filled 2021-05-04: qty 250

## 2021-05-04 MED ORDER — PHENYLEPHRINE 40 MCG/ML (10ML) SYRINGE FOR IV PUSH (FOR BLOOD PRESSURE SUPPORT): 80.0000 ug | PREFILLED_SYRINGE | INTRAVENOUS | Status: DC | PRN

## 2021-05-04 MED ORDER — OXYTOCIN-SODIUM CHLORIDE 30-0.9 UT/500ML-% IV SOLN
1.0000 m[IU]/min | INTRAVENOUS | Status: DC
  Administered 2021-05-04 – 2021-05-05 (×2): 2 m[IU]/min via INTRAVENOUS
  Filled 2021-05-04: qty 500

## 2021-05-04 MED ORDER — MISOPROSTOL 25 MCG QUARTER TABLET
25.0000 ug | ORAL_TABLET | ORAL | Status: DC
  Administered 2021-05-04: 25 ug via VAGINAL

## 2021-05-04 MED ORDER — TERBUTALINE SULFATE 1 MG/ML IJ SOLN
0.2500 mg | Freq: Once | INTRAMUSCULAR | Status: DC | PRN
Start: 2021-05-04 — End: 2021-05-06

## 2021-05-04 NOTE — Progress Notes (Signed)
Patient Vitals for the past 4 hrs:  BP Temp Temp src Pulse Resp  05/04/21 0417 134/84 98.1 F (36.7 C) Oral 76 15   Cx now loose 1/outer os/80/-2.  Foley inserted, but curls right back out when attempting to thread, inner os closed.  Will administer (hopefully) one more cytotec. FHR Cat 1, mild ctx.

## 2021-05-04 NOTE — Progress Notes (Signed)
Labor Progress Note Bonnie Long is a 25 y.o. G1P0000 at [redacted]w[redacted]d presented for IOL cHTN SI PreE w/o SF S:  Feeling good, having some increase in contractions. Denies headaches, blurry vision, chest pain and SOB  O:  BP 117/68   Pulse 87   Temp 98.3 F (36.8 C)   Resp 16   Ht 5\' 3"  (1.6 m)   Wt 117.3 kg   LMP 08/16/2020 (Exact Date)   BMI 45.79 kg/m  EFM: 145bpm/moderate variability/+ accels, no decels Toco: not tracing well, ~q57min  CVE: Dilation: 3 Effacement (%): 50 Cervical Position: Posterior Station: Ballotable Presentation: Vertex Exam by:: S Nix RN   A&P: 25 y.o. G1P0000 [redacted]w[redacted]d IOL cHTN SI PreE w/o SF #Labor: S/p Cytotec x8 now making change and started pitocin titrating as per protocol #Pain: Epidural as desired #FWB: Cat 1 #GBS negative #PreE w/o SF: labetolol 200mg  BID, asymptomatic s/sx, vitals normotensive.  [redacted]w[redacted]d, MD 4:55 PM

## 2021-05-04 NOTE — Progress Notes (Signed)
Bonnie Long is a 25 y.o. G1P0000 at [redacted]w[redacted]d by   Subjective: FOB and doula Kiara at bedside. Denies pressure, desires unmedicated birth  Objective: BP 129/87   Pulse 87   Temp 98.3 F (36.8 C)   Resp 14   Ht 5\' 3"  (1.6 m)   Wt 117.3 kg   LMP 08/16/2020 (Exact Date)   BMI 45.79 kg/m  No intake/output data recorded. No intake/output data recorded.  FHT:  FHR: 140 bpm, variability: moderate,  accelerations:  Present,  decelerations:  Absent UC:   irregular, every 4 minutes SVE:   Dilation: 3 Effacement (%): 50 Station: 08/18/2020 Exam by:: Sam W CNM  Labs: Lab Results  Component Value Date   WBC 10.8 (H) 05/04/2021   HGB 10.8 (L) 05/04/2021   HCT 33.1 (L) 05/04/2021   MCV 81.7 05/04/2021   PLT 231 05/04/2021    Assessment / Plan:  #LABOR --Cat I tracing, mom ambulating, repositioning with assistance from doula --GBS NEG --S/p Cytotec --Pitocin infusing at 12 milliunits --Desires unmedicated labor  #CHTN w/ SIPC --Predominantly normotensive since admission --Labetalol 200 mg BID ordered, due at 2200 --No recent P:Cr, will order full set of PEC labs now --Serum Cr noted to be near severe range about 21 hours ago  Anticipate vaginal delivery  07/04/2021, CNM 05/04/2021, 8:31 PM

## 2021-05-04 NOTE — Progress Notes (Signed)
Bonnie Long is a 25 y.o. G1P0000 at [redacted]w[redacted]d admitted for induction of labor due to chronic Hypertension with SIPE.  Subjective: No complaints at this time. Feeling crampy, but no contractions right now.   Objective: BP 116/63   Pulse 78   Temp 98.1 F (36.7 C) (Oral)   Resp 18   Ht 5\' 3"  (1.6 m)   Wt 117.3 kg   LMP 08/16/2020 (Exact Date)   BMI 45.79 kg/m  No intake/output data recorded. No intake/output data recorded.  FHT:  FHR: 135 bpm, variability: moderate,  accelerations:  Present,  decelerations:  Absent UC:   no contractions, some uterine irritability  SVE:   Dilation: Fingertip Effacement (%): 80 Station: -2 Exam by:: [email or web address] CNM  Attempted to place a cooks balloon with speculum, and able to thread the catheter into the cervical canal. Unable to advance through the internal os. Attempted to fill a small amount of the balloon to see if that would help dilate the cervix to then advance all the way, but still unable to fully advance the catheter. Took the speculum out and attempted one more time to advance using a blind technique, but still unable to get through the internal os. Drenda Freeze of cytotec placed vaginally.   Labs: Lab Results  Component Value Date   WBC 9.9 05/03/2021   HGB 10.4 (L) 05/03/2021   HCT 31.9 (L) 05/03/2021   MCV 82.6 05/03/2021   PLT 236 05/03/2021    Assessment / Plan: IOL s/s CHTN with SIPE Cervical ripening phase Still unable to place FB  Labor:  cervical ripening phase  Preeclampsia:  labs stable Fetal Wellbeing:  Category I Pain Control:  Labor support without medications I/D:  n/a Anticipated MOD:  NSVD  07/03/2021 DNP, CNM  05/04/21  11:06 AM

## 2021-05-05 ENCOUNTER — Inpatient Hospital Stay (HOSPITAL_COMMUNITY): Payer: 59 | Admitting: Anesthesiology

## 2021-05-05 LAB — CBC
HCT: 35.1 % — ABNORMAL LOW (ref 36.0–46.0)
Hemoglobin: 11.4 g/dL — ABNORMAL LOW (ref 12.0–15.0)
MCH: 26.5 pg (ref 26.0–34.0)
MCHC: 32.5 g/dL (ref 30.0–36.0)
MCV: 81.4 fL (ref 80.0–100.0)
Platelets: 261 10*3/uL (ref 150–400)
RBC: 4.31 MIL/uL (ref 3.87–5.11)
RDW: 13.8 % (ref 11.5–15.5)
WBC: 15.2 10*3/uL — ABNORMAL HIGH (ref 4.0–10.5)
nRBC: 0 % (ref 0.0–0.2)

## 2021-05-05 LAB — BASIC METABOLIC PANEL
Anion gap: 11 (ref 5–15)
BUN: 8 mg/dL (ref 6–20)
CO2: 20 mmol/L — ABNORMAL LOW (ref 22–32)
Calcium: 9 mg/dL (ref 8.9–10.3)
Chloride: 101 mmol/L (ref 98–111)
Creatinine, Ser: 0.92 mg/dL (ref 0.44–1.00)
GFR, Estimated: >60 mL/min (ref >60)
Glucose, Bld: 94 mg/dL (ref 70–99)
Potassium: 3.7 mmol/L (ref 3.5–5.1)
Sodium: 132 mmol/L — ABNORMAL LOW (ref 135–145)

## 2021-05-05 MED ORDER — LIDOCAINE HCL (PF) 1 % IJ SOLN
INTRAMUSCULAR | Status: DC | PRN
  Administered 2021-05-05: 12 mL via EPIDURAL

## 2021-05-05 NOTE — Anesthesia Procedure Notes (Signed)
Epidural Patient location during procedure: OB Start time: 05/05/2021 10:30 PM End time: 05/05/2021 10:35 PM  Staffing Anesthesiologist: Leilani Able, MD Performed: anesthesiologist   Preanesthetic Checklist Completed: patient identified, IV checked, site marked, risks and benefits discussed, surgical consent, monitors and equipment checked, pre-op evaluation and timeout performed  Epidural Patient position: sitting Prep: DuraPrep and site prepped and draped Patient monitoring: continuous pulse ox and blood pressure Approach: midline Location: L3-L4 Injection technique: LOR air  Needle:  Needle type: Tuohy  Needle gauge: 17 G Needle length: 9 cm and 9 Needle insertion depth: 9 cm Catheter type: closed end flexible Catheter size: 19 Gauge Catheter at skin depth: 15 cm Test dose: negative and Other  Assessment Events: blood not aspirated, injection not painful, no injection resistance, no paresthesia and negative IV test  Additional Notes Reason for block:procedure for pain

## 2021-05-05 NOTE — Progress Notes (Signed)
Bonnie Long is a 25 y.o. G1P0000 at [redacted]w[redacted]d   Subjective: Pain score 2/10, resting comfortably in bed in high fowler's  Objective: BP 127/89   Pulse 73   Temp 98.4 F (36.9 C) (Oral)   Resp 18   Ht 5\' 3"  (1.6 m)   Wt 117.3 kg   LMP 08/16/2020 (Exact Date)   BMI 45.79 kg/m  No intake/output data recorded. No intake/output data recorded.  FHT:  FHR: 135 bpm, variability: moderate,  accelerations:  Present,  decelerations:  Absent UC:   regular, every 2-4 minutes SVE:   Dilation: 4 Effacement (%): 70 Station: -3 Exam by:: [email or web address], RN  Labs: Lab Results  Component Value Date   WBC 11.8 (H) 05/04/2021   HGB 11.3 (L) 05/04/2021   HCT 34.4 (L) 05/04/2021   MCV 81.7 05/04/2021   PLT 261 05/04/2021    Assessment / Plan: --Cat I tracing --Not appropriate for AROM at this time --Mildly elevated BP, no severe symptoms or severe range BP --Continue Pitocin titration --Anticipate vaginal delivery    07/04/2021, CNM 05/05/2021, 12:51 AM

## 2021-05-05 NOTE — Progress Notes (Signed)
Bonnie Long MRN: [id number]  Subjective: -Care assumed of 25 y.o. G1P0 at [redacted]w[redacted]d who presents for IOL s/t cHTN with SIPE w/o SF. Pregnancy and medical history significant for CHTN, Polyhydramnios.  In room to meet acquaintance of patient and family.  Patient sitting on toilet and reports pelvic pressure.  Requests exam.  Denies visual disturbances, RUQ pain, and HA.  Endorses fetal movement.    Objective: BP (!) 133/59   Pulse (!) 105   Temp 98.3 F (36.8 C) (Oral)   Resp 18   Ht 5\' 3"  (1.6 m)   Wt 117.3 kg   LMP 08/16/2020 (Exact Date)   SpO2 100%   BMI 45.79 kg/m  I/O last 3 completed shifts: In: 1773.6 [P.O.:420; I.V.:1353.6] Out: 1200 [Urine:1200] No intake/output data recorded. Vitals:   05/05/21 2034 05/05/21 2039 05/05/21 2046 05/05/21 2110  BP: 135/82   134/78  Pulse: 98   91  Resp: 18   17  Temp:      TempSrc:      SpO2:  100% 98%   Weight:      Height:        Fetal Monitoring: FHT: 150 bpm, Mod Var, -Decels, +Accels UC: Q105min, MVUs 3m    Vaginal Exam: SVE:   Dilation: 4 Effacement (%): 70 Station: -2 Exam by:: Kamaal Cast CNM Membranes:AROM x 5 hrs Internal Monitors: IUPC in place  Augmentation/Induction: Pitocin:43mUn/min Cytotec: S/P 8 Doses  Assessment:  IUP at 37.3 weeks Cat I FT  CHTN with SIPE IOL    Plan: -Cervical exam as above. -Informed of availability of pain medication.  -Discussed types of medications including IV and epidural.  -Discussed repeating labs to assess Creatinine level. -Continue pitocin titration per protocol. -Okay for pain medication as desired.  -Continue other mgmt as ordered. -Dr. 18m. Anyanwu to be updated on patient progress as relevant.    Marquis Lunch, CNM Advanced Practice Provider, Center for [facility] 05/05/2021, 9:15 PM

## 2021-05-05 NOTE — Progress Notes (Signed)
Bonnie Long is a 25 y.o. G1P0000 at [redacted]w[redacted]d admitted for induction of labor due to Hypertension.  Subjective:  No complaints at this time. Feeling some discomfort with contractions, but otherwise resting comfortably. She was able to work through the [employer] last evening.   Objective: BP 128/89   Pulse 71   Temp 98.1 F (36.7 C) (Oral)   Resp 18   Ht 5\' 3"  (1.6 m)   Wt 117.3 kg   LMP 08/16/2020 (Exact Date)   BMI 45.79 kg/m  No intake/output data recorded. No intake/output data recorded.  FHT:  FHR: 130 bpm, variability: moderate,  accelerations:  Present,  decelerations:  Absent UC:   irregular, every 3-10 minutes SVE:   Dilation: 4 Effacement (%): 70 Station: -3 Exam by:: [email or web address], CNM  Labs: Lab Results  Component Value Date   WBC 11.8 (H) 05/04/2021   HGB 11.3 (L) 05/04/2021   HCT 34.4 (L) 05/04/2021   MCV 81.7 05/04/2021   PLT 261 05/04/2021    Assessment / Plan: Still in cervical ripening phase of induction  Pitocin was started around 1500 on 05/04/2021. Will check again in a few hours, if still no major change will consider DC'ing pitocin and trying another dose of cytotec.   Labor:  latent/cervical ripening phase Preeclampsia:  labs stable Fetal Wellbeing:  Category I Pain Control:  Labor support without medications I/D:  n/a Anticipated MOD:  NSVD  07/04/2021 DNP, CNM  05/05/21  9:21 AM

## 2021-05-05 NOTE — Progress Notes (Signed)
Bonnie Long is a 25 y.o. G1P0000 at [redacted]w[redacted]d by   Subjective: Continues to endorse mild ctx pain 2/10.  Objective: BP 127/75   Pulse 91   Temp 98.1 F (36.7 C) (Oral)   Resp 18   Ht 5\' 3"  (1.6 m)   Wt 117.3 kg   LMP 08/16/2020 (Exact Date)   BMI 45.79 kg/m  No intake/output data recorded. No intake/output data recorded.  FHT:  FHR: 135 bpm, variability: moderate,  accelerations:  Present,  decelerations:  Absent UC:   regular, every 2 minutes SVE:   Dilation: 4 Effacement (%): 70 Station: -3 Exam by:: [email or web address]: Lab Results  Component Value Date   WBC 11.8 (H) 05/04/2021   HGB 11.3 (L) 05/04/2021   HCT 34.4 (L) 05/04/2021   MCV 81.7 05/04/2021   PLT 261 05/04/2021    Assessment / Plan:   Labor: --On Pitocin, borderline tachysytole --Membranes swept during most recent exam, will decrease Pitocin by 2 milliunits --Assess for AROM with future checks --Patient encouraged to labor on birthing ball  CHTN with SIP --Stable --Asymptomatic  07/04/2021, CNM 05/05/2021, 5:17 AM

## 2021-05-05 NOTE — Progress Notes (Signed)
Bonnie Long is a 25 y.o. G1P0000 at [redacted]w[redacted]d  admitted for induction of labor due to Hypertension.  Subjective: No complaints. Has had a pitocin break with time to shower and eat.   Objective: BP 123/80   Pulse 75   Temp 97.9 F (36.6 C) (Oral)   Resp 20   Ht 5\' 3"  (1.6 m)   Wt 117.3 kg   LMP 08/16/2020 (Exact Date)   BMI 45.79 kg/m  No intake/output data recorded. Total I/O In: 712.2 [P.O.:150; I.V.:562.2] Out: 350 [Urine:350]  FHT:  FHR: 130 bpm, variability: moderate,  accelerations:  Present,  decelerations:  Absent UC:   irregular, every 10+ minutes, difficult to trace  SVE:  4/70/-1 AROM, clear fluid, IUPC placed   Labs: Lab Results  Component Value Date   WBC 11.8 (H) 05/04/2021   HGB 11.3 (L) 05/04/2021   HCT 34.4 (L) 05/04/2021   MCV 81.7 05/04/2021   PLT 261 05/04/2021    Assessment / Plan: IOL still in latent phase at this time AROM now IUPC placed Patient has had a pitocin break, but will now resume pitocin  Labor: Progressing normally Preeclampsia:  labs stable Fetal Wellbeing:  Category I Pain Control:  Labor support without medications I/D:  n/a Anticipated MOD:  NSVD    07/04/2021 DNP, CNM  05/05/21  3:30 PM

## 2021-05-05 NOTE — Anesthesia Preprocedure Evaluation (Signed)
Anesthesia Evaluation  Patient identified by MRN, date of birth, ID band Patient awake    Reviewed: Allergy & Precautions, H&P , Patient's Chart, lab work & pertinent test results  Airway Mallampati: III  TM Distance: >3 FB Neck ROM: full    Dental no notable dental hx.    Pulmonary neg pulmonary ROS,    Pulmonary exam normal        Cardiovascular hypertension, Pt. on home beta blockers negative cardio ROS Normal cardiovascular exam     Neuro/Psych negative psych ROS   GI/Hepatic negative GI ROS, Neg liver ROS,   Endo/Other  Morbid obesity  Renal/GU negative Renal ROS  negative genitourinary   Musculoskeletal negative musculoskeletal ROS (+)   Abdominal (+) + obese,   Peds  Hematology negative hematology ROS (+)   Anesthesia Other Findings   Reproductive/Obstetrics (+) Pregnancy                             Anesthesia Physical Anesthesia Plan  ASA: 3  Anesthesia Plan: Epidural   Post-op Pain Management:    Induction:   PONV Risk Score and Plan:   Airway Management Planned:   Additional Equipment:   Intra-op Plan:   Post-operative Plan:   Informed Consent: I have reviewed the patients History and Physical, chart, labs and discussed the procedure including the risks, benefits and alternatives for the proposed anesthesia with the patient or authorized representative who has indicated his/her understanding and acceptance.       Plan Discussed with:   Anesthesia Plan Comments:         Anesthesia Quick Evaluation

## 2021-05-06 ENCOUNTER — Encounter (HOSPITAL_COMMUNITY): Payer: Self-pay | Admitting: Obstetrics & Gynecology

## 2021-05-06 ENCOUNTER — Encounter (HOSPITAL_COMMUNITY)
Admission: AD | Disposition: A | Discharge status: Home / Self Care | Payer: Self-pay | Source: Home / Self Care | Attending: Obstetrics & Gynecology

## 2021-05-06 DIAGNOSIS — O411290 Chorioamnionitis, unspecified trimester, not applicable or unspecified: Secondary | ICD-10-CM | POA: Diagnosis not present

## 2021-05-06 DIAGNOSIS — Z3A37 37 weeks gestation of pregnancy: Secondary | ICD-10-CM

## 2021-05-06 DIAGNOSIS — O411230 Chorioamnionitis, third trimester, not applicable or unspecified: Secondary | ICD-10-CM

## 2021-05-06 DIAGNOSIS — O1410 Severe pre-eclampsia, unspecified trimester: Secondary | ICD-10-CM | POA: Diagnosis not present

## 2021-05-06 DIAGNOSIS — O114 Pre-existing hypertension with pre-eclampsia, complicating childbirth: Secondary | ICD-10-CM

## 2021-05-06 DIAGNOSIS — O0993 Supervision of high risk pregnancy, unspecified, third trimester: Secondary | ICD-10-CM

## 2021-05-06 HISTORY — DX: Severe pre-eclampsia, unspecified trimester: O14.10

## 2021-05-06 LAB — CBC
HCT: 34.4 % — ABNORMAL LOW (ref 36.0–46.0)
HCT: 22.5 % — ABNORMAL LOW (ref 36.0–46.0)
Hemoglobin: 11.2 g/dL — ABNORMAL LOW (ref 12.0–15.0)
Hemoglobin: 7.3 g/dL — ABNORMAL LOW (ref 12.0–15.0)
MCH: 27 pg (ref 26.0–34.0)
MCH: 26.9 pg (ref 26.0–34.0)
MCHC: 32.6 g/dL (ref 30.0–36.0)
MCHC: 32.4 g/dL (ref 30.0–36.0)
MCV: 82.9 fL (ref 80.0–100.0)
MCV: 83 fL (ref 80.0–100.0)
Platelets: 267 10*3/uL (ref 150–400)
Platelets: 166 10*3/uL (ref 150–400)
RBC: 4.15 MIL/uL (ref 3.87–5.11)
RBC: 2.71 MIL/uL — ABNORMAL LOW (ref 3.87–5.11)
RDW: 14 % (ref 11.5–15.5)
RDW: 14.1 % (ref 11.5–15.5)
WBC: 16.9 10*3/uL — ABNORMAL HIGH (ref 4.0–10.5)
WBC: 12.1 10*3/uL — ABNORMAL HIGH (ref 4.0–10.5)
nRBC: 0 % (ref 0.0–0.2)
nRBC: 0 % (ref 0.0–0.2)

## 2021-05-06 LAB — COMPREHENSIVE METABOLIC PANEL
ALT: 11 U/L (ref 0–44)
ALT: 10 U/L (ref 0–44)
AST: 15 U/L (ref 15–41)
AST: 14 U/L — ABNORMAL LOW (ref 15–41)
Albumin: 2.5 g/dL — ABNORMAL LOW (ref 3.5–5.0)
Albumin: 2 g/dL — ABNORMAL LOW (ref 3.5–5.0)
Alkaline Phosphatase: 151 U/L — ABNORMAL HIGH (ref 38–126)
Alkaline Phosphatase: 101 U/L (ref 38–126)
Anion gap: 11 (ref 5–15)
Anion gap: 9 (ref 5–15)
BUN: 11 mg/dL (ref 6–20)
BUN: 12 mg/dL (ref 6–20)
CO2: 20 mmol/L — ABNORMAL LOW (ref 22–32)
CO2: 19 mmol/L — ABNORMAL LOW (ref 22–32)
Calcium: 9 mg/dL (ref 8.9–10.3)
Calcium: 7.9 mg/dL — ABNORMAL LOW (ref 8.9–10.3)
Chloride: 104 mmol/L (ref 98–111)
Chloride: 107 mmol/L (ref 98–111)
Creatinine, Ser: 1.65 mg/dL — ABNORMAL HIGH (ref 0.44–1.00)
Creatinine, Ser: 1.97 mg/dL — ABNORMAL HIGH (ref 0.44–1.00)
GFR, Estimated: 45 mL/min — ABNORMAL LOW (ref >60)
GFR, Estimated: 36 mL/min — ABNORMAL LOW (ref >60)
Glucose, Bld: 98 mg/dL (ref 70–99)
Glucose, Bld: 91 mg/dL (ref 70–99)
Potassium: 4.3 mmol/L (ref 3.5–5.1)
Potassium: 3.7 mmol/L (ref 3.5–5.1)
Sodium: 135 mmol/L (ref 135–145)
Sodium: 135 mmol/L (ref 135–145)
Total Bilirubin: 1.4 mg/dL — ABNORMAL HIGH (ref 0.3–1.2)
Total Bilirubin: 1.5 mg/dL — ABNORMAL HIGH (ref 0.3–1.2)
Total Protein: 6.5 g/dL (ref 6.5–8.1)
Total Protein: 4.4 g/dL — ABNORMAL LOW (ref 6.5–8.1)

## 2021-05-06 LAB — MAGNESIUM: Magnesium: 2.4 mg/dL (ref 1.7–2.4)

## 2021-05-06 LAB — PREPARE RBC (CROSSMATCH)

## 2021-05-06 LAB — ABO/RH: ABO/RH(D): A POS

## 2021-05-06 SURGERY — Surgical Case
Anesthesia: Epidural

## 2021-05-06 MED ORDER — WITCH HAZEL-GLYCERIN EX PADS: 1.0000 "application " | MEDICATED_PAD | CUTANEOUS | Status: DC | PRN

## 2021-05-06 MED ORDER — KETOROLAC TROMETHAMINE 30 MG/ML IJ SOLN: 30.0000 mg | Freq: Once | INTRAMUSCULAR | Status: DC | PRN

## 2021-05-06 MED ORDER — LACTATED RINGERS AMNIOINFUSION: INTRAVENOUS | Status: DC

## 2021-05-06 MED ORDER — SODIUM BICARBONATE 8.4 % IV SOLN
INTRAVENOUS | Status: DC | PRN
  Administered 2021-05-06 (×2): 5 mL via EPIDURAL
  Administered 2021-05-06: 3 mL via EPIDURAL
  Administered 2021-05-06: 2 mL via EPIDURAL

## 2021-05-06 MED ORDER — SODIUM CHLORIDE 0.9 % IV SOLN
2.0000 g | Freq: Four times a day (QID) | INTRAVENOUS | Status: DC
  Administered 2021-05-06: 2 g via INTRAVENOUS
  Filled 2021-05-06: qty 2000

## 2021-05-06 MED ORDER — ACETAMINOPHEN 500 MG PO TABS: 1000.0000 mg | ORAL_TABLET | Freq: Once | ORAL | Status: DC

## 2021-05-06 MED ORDER — ACETAMINOPHEN 500 MG PO TABS
1000.0000 mg | ORAL_TABLET | Freq: Once | ORAL | Status: AC
  Administered 2021-05-06: 1000 mg via ORAL
  Filled 2021-05-06: qty 2

## 2021-05-06 MED ORDER — LACTATED RINGERS IV BOLUS
1000.0000 mL | Freq: Once | INTRAVENOUS | Status: AC
  Administered 2021-05-06: 1000 mL via INTRAVENOUS

## 2021-05-06 MED ORDER — NALBUPHINE HCL 10 MG/ML IJ SOLN: 5.0000 mg | Freq: Once | INTRAMUSCULAR | Status: DC | PRN

## 2021-05-06 MED ORDER — ALBUMIN HUMAN 5 % IV SOLN
12.5000 g | Freq: Once | INTRAVENOUS | Status: AC
  Administered 2021-05-06: 12.5 g via INTRAVENOUS
  Filled 2021-05-06: qty 250

## 2021-05-06 MED ORDER — TETANUS-DIPHTH-ACELL PERTUSSIS 5-2.5-18.5 LF-MCG/0.5 IM SUSY: 0.5000 mL | PREFILLED_SYRINGE | Freq: Once | INTRAMUSCULAR | Status: DC

## 2021-05-06 MED ORDER — TRANEXAMIC ACID-NACL 1000-0.7 MG/100ML-% IV SOLN
1000.0000 mg | INTRAVENOUS | Status: AC
  Administered 2021-05-06: 1000 mg via INTRAVENOUS

## 2021-05-06 MED ORDER — PHENYLEPHRINE 40 MCG/ML (10ML) SYRINGE FOR IV PUSH (FOR BLOOD PRESSURE SUPPORT)
PREFILLED_SYRINGE | INTRAVENOUS | Status: AC
  Filled 2021-05-06: qty 30

## 2021-05-06 MED ORDER — ALBUMIN HUMAN 5 % IV SOLN: INTRAVENOUS | Status: DC | PRN

## 2021-05-06 MED ORDER — DIPHENHYDRAMINE HCL 25 MG PO CAPS: 25.0000 mg | ORAL_CAPSULE | Freq: Four times a day (QID) | ORAL | Status: DC | PRN

## 2021-05-06 MED ORDER — MAGNESIUM SULFATE 40 GM/1000ML IV SOLN
1.0000 g/h | INTRAVENOUS | Status: AC
  Administered 2021-05-06: 1 g/h via INTRAVENOUS

## 2021-05-06 MED ORDER — NALBUPHINE HCL 10 MG/ML IJ SOLN: 5.0000 mg | INTRAMUSCULAR | Status: DC | PRN

## 2021-05-06 MED ORDER — DEXAMETHASONE SODIUM PHOSPHATE 10 MG/ML IJ SOLN
INTRAMUSCULAR | Status: AC
  Filled 2021-05-06: qty 1

## 2021-05-06 MED ORDER — FENTANYL CITRATE (PF) 100 MCG/2ML IJ SOLN
INTRAMUSCULAR | Status: DC | PRN
  Administered 2021-05-06: 100 ug via EPIDURAL

## 2021-05-06 MED ORDER — LACTATED RINGERS IV SOLN: INTRAVENOUS | Status: AC

## 2021-05-06 MED ORDER — HYDRALAZINE HCL 20 MG/ML IJ SOLN: 5.0000 mg | INTRAMUSCULAR | Status: DC | PRN

## 2021-05-06 MED ORDER — SODIUM CHLORIDE 0.9 % IV SOLN
2.0000 g | Freq: Four times a day (QID) | INTRAVENOUS | Status: AC
  Administered 2021-05-06 – 2021-05-07 (×3): 2 g via INTRAVENOUS
  Filled 2021-05-06 (×3): qty 2000

## 2021-05-06 MED ORDER — GENTAMICIN SULFATE 40 MG/ML IJ SOLN: 5.0000 mg/kg | Freq: Once | INTRAVENOUS | Status: DC

## 2021-05-06 MED ORDER — OXYTOCIN-SODIUM CHLORIDE 30-0.9 UT/500ML-% IV SOLN: 2.5000 [IU]/h | INTRAVENOUS | Status: AC

## 2021-05-06 MED ORDER — ONDANSETRON HCL 4 MG/2ML IJ SOLN
INTRAMUSCULAR | Status: AC
  Filled 2021-05-06: qty 2

## 2021-05-06 MED ORDER — MEPERIDINE HCL 25 MG/ML IJ SOLN
INTRAMUSCULAR | Status: AC
  Filled 2021-05-06: qty 1

## 2021-05-06 MED ORDER — SIMETHICONE 80 MG PO CHEW: 80.0000 mg | CHEWABLE_TABLET | ORAL | Status: DC | PRN

## 2021-05-06 MED ORDER — SCOPOLAMINE 1 MG/3DAYS TD PT72
MEDICATED_PATCH | TRANSDERMAL | Status: AC
  Filled 2021-05-06: qty 1

## 2021-05-06 MED ORDER — HYDRALAZINE HCL 20 MG/ML IJ SOLN: 10.0000 mg | INTRAMUSCULAR | Status: DC | PRN

## 2021-05-06 MED ORDER — DIPHENHYDRAMINE HCL 25 MG PO CAPS
25.0000 mg | ORAL_CAPSULE | ORAL | Status: DC | PRN
  Administered 2021-05-07: 25 mg via ORAL
  Filled 2021-05-06: qty 1

## 2021-05-06 MED ORDER — HYDROMORPHONE HCL 1 MG/ML IJ SOLN: 0.2500 mg | INTRAMUSCULAR | Status: DC | PRN

## 2021-05-06 MED ORDER — SODIUM CHLORIDE 0.9 % IV SOLN
500.0000 mg | Freq: Once | INTRAVENOUS | Status: AC
  Administered 2021-05-06: 500 mg via INTRAVENOUS

## 2021-05-06 MED ORDER — SOD CITRATE-CITRIC ACID 500-334 MG/5ML PO SOLN
30.0000 mL | ORAL | Status: AC
  Administered 2021-05-06: 30 mL via ORAL

## 2021-05-06 MED ORDER — FENTANYL CITRATE (PF) 100 MCG/2ML IJ SOLN
INTRAMUSCULAR | Status: AC
  Filled 2021-05-06: qty 2

## 2021-05-06 MED ORDER — PROMETHAZINE HCL 25 MG/ML IJ SOLN: 6.2500 mg | INTRAMUSCULAR | Status: DC | PRN

## 2021-05-06 MED ORDER — CLINDAMYCIN PHOSPHATE 900 MG/50ML IV SOLN
900.0000 mg | Freq: Three times a day (TID) | INTRAVENOUS | Status: AC
  Administered 2021-05-06 – 2021-05-07 (×2): 900 mg via INTRAVENOUS
  Filled 2021-05-06 (×2): qty 50

## 2021-05-06 MED ORDER — CEFAZOLIN SODIUM-DEXTROSE 2-4 GM/100ML-% IV SOLN
2.0000 g | INTRAVENOUS | Status: AC
  Administered 2021-05-06: 2 g via INTRAVENOUS

## 2021-05-06 MED ORDER — SENNOSIDES-DOCUSATE SODIUM 8.6-50 MG PO TABS
2.0000 | ORAL_TABLET | Freq: Every day | ORAL | Status: DC
  Administered 2021-05-07 – 2021-05-09 (×3): 2 via ORAL
  Filled 2021-05-06 (×3): qty 2

## 2021-05-06 MED ORDER — OXYCODONE HCL 5 MG/5ML PO SOLN: 5.0000 mg | Freq: Once | ORAL | Status: DC | PRN

## 2021-05-06 MED ORDER — ONDANSETRON HCL 4 MG/2ML IJ SOLN
INTRAMUSCULAR | Status: DC | PRN
  Administered 2021-05-06: 4 mg via INTRAVENOUS

## 2021-05-06 MED ORDER — MORPHINE SULFATE (PF) 0.5 MG/ML IJ SOLN
INTRAMUSCULAR | Status: AC
  Filled 2021-05-06: qty 10

## 2021-05-06 MED ORDER — CLINDAMYCIN PHOSPHATE 900 MG/50ML IV SOLN
900.0000 mg | Freq: Three times a day (TID) | INTRAVENOUS | Status: DC
  Administered 2021-05-06: 900 mg via INTRAVENOUS

## 2021-05-06 MED ORDER — LABETALOL HCL 5 MG/ML IV SOLN: 40.0000 mg | INTRAVENOUS | Status: DC | PRN

## 2021-05-06 MED ORDER — DIPHENHYDRAMINE HCL 50 MG/ML IJ SOLN: 12.5000 mg | INTRAMUSCULAR | Status: DC | PRN

## 2021-05-06 MED ORDER — OXYTOCIN-SODIUM CHLORIDE 30-0.9 UT/500ML-% IV SOLN
INTRAVENOUS | Status: DC | PRN
  Administered 2021-05-06: 300 mL via INTRAVENOUS

## 2021-05-06 MED ORDER — PHENYLEPHRINE 40 MCG/ML (10ML) SYRINGE FOR IV PUSH (FOR BLOOD PRESSURE SUPPORT)
PREFILLED_SYRINGE | INTRAVENOUS | Status: DC | PRN
  Administered 2021-05-06 (×2): 80 ug via INTRAVENOUS
  Administered 2021-05-06: 120 ug via INTRAVENOUS
  Administered 2021-05-06 (×7): 80 ug via INTRAVENOUS

## 2021-05-06 MED ORDER — MENTHOL 3 MG MT LOZG: 1.0000 | LOZENGE | OROMUCOSAL | Status: DC | PRN

## 2021-05-06 MED ORDER — OXYTOCIN-SODIUM CHLORIDE 30-0.9 UT/500ML-% IV SOLN
INTRAVENOUS | Status: AC
  Filled 2021-05-06: qty 500

## 2021-05-06 MED ORDER — SIMETHICONE 80 MG PO CHEW
80.0000 mg | CHEWABLE_TABLET | Freq: Three times a day (TID) | ORAL | Status: DC
  Administered 2021-05-07 – 2021-05-09 (×8): 80 mg via ORAL
  Filled 2021-05-06 (×8): qty 1

## 2021-05-06 MED ORDER — OXYCODONE HCL 5 MG PO TABS: 5.0000 mg | ORAL_TABLET | Freq: Once | ORAL | Status: DC | PRN

## 2021-05-06 MED ORDER — LABETALOL HCL 5 MG/ML IV SOLN: 20.0000 mg | INTRAVENOUS | Status: DC | PRN

## 2021-05-06 MED ORDER — SCOPOLAMINE 1 MG/3DAYS TD PT72
MEDICATED_PATCH | TRANSDERMAL | Status: DC | PRN
  Administered 2021-05-06: 1 via TRANSDERMAL

## 2021-05-06 MED ORDER — SODIUM CHLORIDE 0.9% IV SOLUTION: Freq: Once | INTRAVENOUS | Status: AC

## 2021-05-06 MED ORDER — CLINDAMYCIN PHOSPHATE 900 MG/50ML IV SOLN
INTRAVENOUS | Status: AC
  Filled 2021-05-06: qty 50

## 2021-05-06 MED ORDER — MAGNESIUM SULFATE BOLUS VIA INFUSION
4.0000 g | Freq: Once | INTRAVENOUS | Status: AC
  Administered 2021-05-06: 4 g via INTRAVENOUS
  Filled 2021-05-06: qty 1000

## 2021-05-06 MED ORDER — ENOXAPARIN SODIUM 60 MG/0.6ML IJ SOSY
60.0000 mg | PREFILLED_SYRINGE | INTRAMUSCULAR | Status: DC
  Administered 2021-05-07 – 2021-05-09 (×3): 60 mg via SUBCUTANEOUS
  Filled 2021-05-06 (×3): qty 0.6

## 2021-05-06 MED ORDER — DEXAMETHASONE SODIUM PHOSPHATE 10 MG/ML IJ SOLN
INTRAMUSCULAR | Status: DC | PRN
  Administered 2021-05-06: 10 mg via INTRAVENOUS

## 2021-05-06 MED ORDER — COCONUT OIL OIL
1.0000 "application " | TOPICAL_OIL | Status: DC | PRN
  Administered 2021-05-08: 1 via TOPICAL

## 2021-05-06 MED ORDER — TRANEXAMIC ACID-NACL 1000-0.7 MG/100ML-% IV SOLN
INTRAVENOUS | Status: AC
  Filled 2021-05-06: qty 100

## 2021-05-06 MED ORDER — MEASLES, MUMPS & RUBELLA VAC IJ SOLR: 0.5000 mL | Freq: Once | INTRAMUSCULAR | Status: DC

## 2021-05-06 MED ORDER — MEPERIDINE HCL 25 MG/ML IJ SOLN
INTRAMUSCULAR | Status: DC | PRN
  Administered 2021-05-06 (×2): 12.5 mg via INTRAVENOUS

## 2021-05-06 MED ORDER — NALOXONE HCL 0.4 MG/ML IJ SOLN: 0.4000 mg | INTRAMUSCULAR | Status: DC | PRN

## 2021-05-06 MED ORDER — GENTAMICIN SULFATE 40 MG/ML IJ SOLN
5.0000 mg/kg | INTRAVENOUS | Status: DC
  Administered 2021-05-06: 390 mg via INTRAVENOUS
  Filled 2021-05-06: qty 9.75

## 2021-05-06 MED ORDER — MAGNESIUM SULFATE 40 GM/1000ML IV SOLN
INTRAVENOUS | Status: AC
  Filled 2021-05-06: qty 1000

## 2021-05-06 MED ORDER — DIBUCAINE (PERIANAL) 1 % EX OINT: 1.0000 "application " | TOPICAL_OINTMENT | CUTANEOUS | Status: DC | PRN

## 2021-05-06 MED ORDER — MEPERIDINE HCL 25 MG/ML IJ SOLN: 6.2500 mg | INTRAMUSCULAR | Status: DC | PRN

## 2021-05-06 MED ORDER — SODIUM CHLORIDE 0.9% FLUSH: 3.0000 mL | INTRAVENOUS | Status: DC | PRN

## 2021-05-06 MED ORDER — NALOXONE HCL 4 MG/10ML IJ SOLN
1.0000 ug/kg/h | INTRAVENOUS | Status: DC | PRN
  Filled 2021-05-06: qty 5

## 2021-05-06 MED ORDER — PRENATAL MULTIVITAMIN CH
1.0000 | ORAL_TABLET | Freq: Every day | ORAL | Status: DC
  Administered 2021-05-07 – 2021-05-08 (×2): 1 via ORAL
  Filled 2021-05-06 (×2): qty 1

## 2021-05-06 SURGICAL SUPPLY — 37 items
APL SKNCLS STERI-STRIP NONHPOA (GAUZE/BANDAGES/DRESSINGS) ×1
BENZOIN TINCTURE PRP APPL 2/3 (GAUZE/BANDAGES/DRESSINGS) ×2 IMPLANT
CANISTER SUCT 3000ML PPV (MISCELLANEOUS) ×2 IMPLANT
CHLORAPREP W/TINT 26ML (MISCELLANEOUS) ×2 IMPLANT
DRSG OPSITE POSTOP 4X10 (GAUZE/BANDAGES/DRESSINGS) ×2 IMPLANT
ELECT REM PT RETURN 9FT ADLT (ELECTROSURGICAL) ×2
ELECTRODE REM PT RTRN 9FT ADLT (ELECTROSURGICAL) ×1 IMPLANT
EXTRACTOR VACUUM KIWI (MISCELLANEOUS) ×2 IMPLANT
GLOVE BIOGEL PI IND STRL 7.0 (GLOVE) ×2 IMPLANT
GLOVE BIOGEL PI IND STRL 7.5 (GLOVE) ×1 IMPLANT
GLOVE BIOGEL PI INDICATOR 7.0 (GLOVE) ×2
GLOVE BIOGEL PI INDICATOR 7.5 (GLOVE) ×1
GLOVE SKINSENSE NS SZ7.0 (GLOVE) ×1
GLOVE SKINSENSE STRL SZ7.0 (GLOVE) ×1 IMPLANT
GOWN STRL REUS W/ TWL LRG LVL3 (GOWN DISPOSABLE) ×2 IMPLANT
GOWN STRL REUS W/ TWL XL LVL3 (GOWN DISPOSABLE) ×1 IMPLANT
GOWN STRL REUS W/TWL LRG LVL3 (GOWN DISPOSABLE) ×4
GOWN STRL REUS W/TWL XL LVL3 (GOWN DISPOSABLE) ×2
NS IRRIG 1000ML POUR BTL (IV SOLUTION) ×2 IMPLANT
PACK C SECTION WH (CUSTOM PROCEDURE TRAY) ×2 IMPLANT
PAD ABD 7.5X8 STRL (GAUZE/BANDAGES/DRESSINGS) ×2 IMPLANT
PAD OB MATERNITY 4.3X12.25 (PERSONAL CARE ITEMS) ×2 IMPLANT
PAD PREP 24X48 CUFFED NSTRL (MISCELLANEOUS) ×2 IMPLANT
PENCIL SMOKE EVAC W/HOLSTER (ELECTROSURGICAL) ×2 IMPLANT
STRIP CLOSURE SKIN 1/2X4 (GAUZE/BANDAGES/DRESSINGS) ×2 IMPLANT
SUT MNCRL 0 VIOLET CTX 36 (SUTURE) ×2 IMPLANT
SUT MON AB 4-0 PS1 27 (SUTURE) ×2 IMPLANT
SUT MONOCRYL 0 CTX 36 (SUTURE) ×6
SUT PLAIN 2 0 (SUTURE) ×2
SUT PLAIN 2 0 XLH (SUTURE) ×2 IMPLANT
SUT PLAIN ABS 2-0 CT1 27XMFL (SUTURE) IMPLANT
SUT VIC AB 0 CT1 36 (SUTURE) ×4 IMPLANT
SUT VIC AB 3-0 CT1 27 (SUTURE) ×2
SUT VIC AB 3-0 CT1 TAPERPNT 27 (SUTURE) ×1 IMPLANT
SUT VIC AB 4-0 KS 27 (SUTURE) ×1 IMPLANT
TOWEL OR 17X24 6PK STRL BLUE (TOWEL DISPOSABLE) ×4 IMPLANT
WATER STERILE IRR 1000ML POUR (IV SOLUTION) ×2 IMPLANT

## 2021-05-06 NOTE — Progress Notes (Signed)
AYO GUARINO MRN: [id number]  Subjective: -Patient resting in bed.  Reports some intermittent rectal pressure.   Objective: BP 126/81   Pulse (!) 109   Temp 98.4 F (36.9 C) (Oral)   Resp 18   Ht 5\' 3"  (1.6 m)   Wt 117.3 kg   LMP 08/16/2020 (Exact Date)   SpO2 98%   BMI 45.79 kg/m  I/O last 3 completed shifts: In: 1773.6 [P.O.:420; I.V.:1353.6] Out: 1600 [Urine:1600] Total I/O In: 4045 [P.O.:175; I.V.:3782.6; Other:87.4] Out: 1300 [Urine:1300]  Fetal Monitoring: FHT: 150 bpm, Mod Var, -Decels, -Accels UC: Unable to graph   +Scalp Stim  Vaginal Exam: SVE:   Dilation: 6 Effacement (%):  (swollen from 12 to 6) Station: -1, 0 Exam by:: [email or web address] CNM Membranes:SROM x 15hrs Internal Monitors: IUPC Replaced/FSE placed w/o difficulty  Augmentation/Induction: Pitocin:70mUn/min Cytotec: S/P   Assessment:  IUP at 37.4 weeks Cat I FT  Cervical Edema IOL  Plan: -Discussed r/b of fetal scalp electrode including infection, trauma to fetal scalp, and ability to monitor fetal heart rate more accurately.  Patient w/o q/c and agrees to placement.  -Informed of cervical exam findings. -Discussed concern for edema and potential for arrest of dilation. -Cautioned that if no progression recommendation would be to proceed with c/s. -Patient verbalizes understanding.  -Continue other mgmt as ordered -LD Team to be updated on patient status.   9m, CNM Advanced Practice Provider, Center for [facility] 05/06/2021, 6:22 AM

## 2021-05-06 NOTE — Progress Notes (Signed)
Bonnie Long MRN: [id number]  Subjective: -Patient asleep in bed. SO remains at bedside, also asleep.  Provider did not attempt to wake.   Objective: BP 122/72   Pulse 93   Temp 98.3 F (36.8 C) (Oral)   Resp 17   Ht 5\' 3"  (1.6 m)   Wt 117.3 kg   LMP 08/16/2020 (Exact Date)   SpO2 98%   BMI 45.79 kg/m  I/O last 3 completed shifts: In: 1773.6 [P.O.:420; I.V.:1353.6] Out: 1600 [Urine:1600] Total I/O In: 2043.9 [I.V.:2043.9] Out: 250 [Urine:250] Results for orders placed or performed during the hospital encounter of 05/03/21 (from the past 24 hour(s))  Basic metabolic panel     Status: Abnormal   Collection Time: 05/05/21  9:53 PM  Result Value Ref Range   Sodium 132 (L) 135 - 145 mmol/L   Potassium 3.7 3.5 - 5.1 mmol/L   Chloride 101 98 - 111 mmol/L   CO2 20 (L) 22 - 32 mmol/L   Glucose, Bld 94 70 - 99 mg/dL   BUN 8 6 - 20 mg/dL   Creatinine, Ser 07/05/21 0.44 - 1.00 mg/dL   Calcium 9.0 8.9 - 8.25 mg/dL   GFR, Estimated 05.3 >97 mL/min   Anion gap 11 5 - 15  CBC     Status: Abnormal   Collection Time: 05/05/21  9:53 PM  Result Value Ref Range   WBC 15.2 (H) 4.0 - 10.5 K/uL   RBC 4.31 3.87 - 5.11 MIL/uL   Hemoglobin 11.4 (L) 12.0 - 15.0 g/dL   HCT 07/05/21 (L) 34.1 - 93.7 %   MCV 81.4 80.0 - 100.0 fL   MCH 26.5 26.0 - 34.0 pg   MCHC 32.5 30.0 - 36.0 g/dL   RDW 90.2 40.9 - 73.5 %   Platelets 261 150 - 400 K/uL   nRBC 0.0 0.0 - 0.2 %    Fetal Monitoring: FHT: 140 bpm, Mod Var, +Late Decels, -Accels UC: Q4-5min    Vaginal Exam: SVE:   Dilation: 4 Effacement (%): 70 Station: -2 Exam by:: [email or web address] Clemons RN Membranes:AROM x 10 hrs Internal Monitors: IUPC   Augmentation/Induction: Pitocin:3mUn/min Cytotec: S/P  Assessment:  IUP at 37.4 Cat II FT  IOL  Plan: -Will start amnioinfusion. -Nurse instructed to hold pitocin at current rate. -Will reassess after bolus completed. -BMP Returns and Cr. Normal range.  -Continue other mgmt as ordered. -Dr. 13m  to be updated accordingly.   Macon Large, CNM Advanced Practice Provider, Center for [facility] 05/06/2021, 2:02 AM

## 2021-05-06 NOTE — Progress Notes (Signed)
Pt and family educated on use of breast pump. Pt very sleepy, reeducation may be needed. Pt able to pump with some assistance for 30 minutes. Encouraged to pump every 2-3 hours, after latching or attempting to latch infant. Elwyn Reach, RN

## 2021-05-06 NOTE — Anesthesia Postprocedure Evaluation (Signed)
Anesthesia Post Note  Patient: Bonnie Long  Procedure(s) Performed: CESAREAN SECTION     Patient location during evaluation: PACU Anesthesia Type: Epidural Level of consciousness: awake and alert Pain management: pain level controlled Vital Signs Assessment: post-procedure vital signs reviewed and stable Respiratory status: spontaneous breathing, nonlabored ventilation and respiratory function stable Cardiovascular status: blood pressure returned to baseline and stable Postop Assessment: no apparent nausea or vomiting Anesthetic complications: no   No notable events documented.  Last Vitals:  Vitals:   05/06/21 1545 05/06/21 1550  BP: (!) 90/48   Pulse: (!) 104 (!) 107  Resp: (!) 0 18  Temp:    SpO2: 93% 90%    Last Pain:  Vitals:   05/06/21 1510  TempSrc:   PainSc: Asleep   Pain Goal: Patients Stated Pain Goal: 1 (05/04/21 0147)              Epidural/Spinal Function Cutaneous sensation: Able to Wiggle Toes (05/06/21 1525), Patient able to flex knees: Yes (05/06/21 1525), Patient able to lift hips off bed: Yes (05/06/21 1525), Back pain beyond tenderness at insertion site: No (05/06/21 1525), Progressively worsening motor and/or sensory loss: No (05/06/21 1525), Bowel and/or bladder incontinence post epidural: No (05/06/21 1525)  Lowella Curb

## 2021-05-06 NOTE — Progress Notes (Addendum)
One unit PRBCs ordered transfused with another ordered for on hold. 42mL UOP over two hours. Patient had clot in the PACU with total EBL for case and PACU approx .  Will hold off on increasing Mg as Mg only got started in the OR and BPs on the low side. Continue to follow closely.   Cornelia Copa MD Attending Center for [facility] (Faculty Practice) GYN Consult Phone: [phone] (M-F, [phone]) & [phone] (Off hours, weekends, holidays)

## 2021-05-06 NOTE — Discharge Summary (Signed)
Postpartum Discharge Summary     Patient Name: Bonnie Long DOB: 22-Oct-1995 MRN: [id number]  Date of admission: 05/03/2021 Delivery date:05/06/2021  Delivering provider: Aletha Halim  Date of discharge: 05/09/2021  Admitting diagnosis: Chronic hypertension with superimposed preeclampsia [O11.9] Encounter for supervision of high risk pregnancy in third trimester, antepartum [O09.93] Intrauterine pregnancy: [redacted]w[redacted]d    Secondary diagnosis:  Active Problems:   Supervision of high-risk pregnancy   Chronic hypertension with superimposed preeclampsia   Chorioamnionitis   Severe pre-eclampsia   Encounter for supervision of high risk pregnancy in third trimester, antepartum   Acute blood loss as cause of postoperative anemia  Additional problems: None    Discharge diagnosis: Term Pregnancy Delivered, Preeclampsia (severe), and chorioamnionitis                                               Post partum procedures: None Augmentation: AROM, Pitocin, and Cytotec Complications: Intrauterine Inflammation or infection (Chorioamniotis)  Hospital course: Induction of Labor With Cesarean Section   25y.o. yo G1P1001 at [id number] admitted to the hospital 05/03/2021 for induction of labor for cHTN with superimposed preE with severe feature (of elevated creatinine). Patient had a labor course significant arrest of dilation despite multiple rounds of cytotec, pitocin, and being artificially ruptured. Patient also developed intrapartum infection (chorioamnionitis). The patient went for cesarean section due to Arrest of Dilation. Delivery details are as follows: Membrane Rupture Time/Date: 3:20 PM ,05/05/2021   Delivery Method:C-Section, Low Transverse  Details of operation can be found in separate operative note.  It was complicated by PPH due to atony,  EBL 1500 ml.  She received two pRBCs, discharge hemoglobin was 8.6 from a predelivery hemoglobin of 11.2.  Patient denied any headaches, visual symptoms,  RUQ/epigastric pain or other concerning symptoms.   Patient received intrapartum and postpartum magnesium sulfate for eclampsia prophylaxis as per protocol; reduced due to AKI. Creatinine levels improved.  BP control was obtained with Procardia XL 30 mg po qd, there were no further immediate complications.  Otherwise, patient had a routine postpartum course. She is ambulating, tolerating a regular diet, passing flatus, and urinating well.  Patient is discharged home in stable condition on 05/09/2021, and will follow up in the office for BP check in one week.   Newborn Data: Birth date:05/06/2021  Birth time:12:16 PM  Gender:Female  Living status:Living  Apgars:8 ,9  Weight:2925 g                                Magnesium Sulfate received: Yes: Seizure prophylaxis BMZ received: No Rhophylac:No MMR:N/A T-DaP:Given prenatally Flu: N/A Transfusion:Yes  Physical exam  Vitals:   05/09/21 0417 05/09/21 0903 05/09/21 1127 05/09/21 1457  BP: 134/82 (!) 144/79 (!) 152/93 (!) 143/93  Pulse: 87 87 82 79  Resp: '19 19 19 18  ' Temp: 98.3 F (36.8 C) 98.2 F (36.8 C) 98.1 F (36.7 C)   TempSrc: Oral Oral Oral   SpO2: 99% 99% 99%   Weight:      Height:       General: alert, cooperative, and no distress Lochia: appropriate Uterine Fundus: firm Incision: Healing well with no significant drainage, Dressing is clean, dry, and intact DVT Evaluation: No evidence of DVT seen on physical exam.  Negative Homan's sign.  Labs: CBC Latest Ref Rng & Units 05/08/2021 05/07/2021 05/06/2021  WBC 4.0 - 10.5 K/uL 21.7(H) 27.6(H) 29.1(H)  Hemoglobin 12.0 - 15.0 g/dL 8.6(L) 7.5(L) 7.9(L)  Hematocrit 36.0 - 46.0 % 26.2(L) 23.4(L) 24.6(L)  Platelets 150 - 400 K/uL 181 163 157   CMP Latest Ref Rng & Units 05/09/2021 05/08/2021 05/07/2021  Glucose 70 - 99 mg/dL 78 87 106(H)  BUN 6 - 20 mg/dL 17 21(H) 17  Creatinine 0.44 - 1.00 mg/dL 1.41(H) 1.38(H) 1.57(H)  Sodium 135 - 145 mmol/L 134(L) 134(L) 134(L)  Potassium 3.5 - 5.1  mmol/L 4.0 3.8 4.3  Chloride 98 - 111 mmol/L 100 104 104  CO2 22 - 32 mmol/L '26 24 23  ' Calcium 8.9 - 10.3 mg/dL 8.4(L) 8.1(L) 8.0(L)  Total Protein 6.5 - 8.1 g/dL 5.1(L) 5.0(L) 5.2(L)  Total Bilirubin 0.3 - 1.2 mg/dL 1.0 1.0 1.1  Alkaline Phos 38 - 126 U/L 85 89 93  AST 15 - 41 U/L '27 24 25  ' ALT 0 - 44 U/L '18 12 13    ' Edinburgh Score: Edinburgh Postnatal Depression Scale Screening Tool 05/08/2021  I have been able to laugh and see the funny side of things. 0  I have looked forward with enjoyment to things. 0  I have blamed myself unnecessarily when things went wrong. 0  I have been anxious or worried for no good reason. 1  I have felt scared or panicky for no good reason. 0  Things have been getting on top of me. 0  I have been so unhappy that I have had difficulty sleeping. 0  I have felt sad or miserable. 1  I have been so unhappy that I have been crying. 0  The thought of harming myself has occurred to me. 0  Edinburgh Postnatal Depression Scale Total 2     After visit meds:  Allergies as of 05/09/2021   No Known Allergies      Medication List     STOP taking these medications    aspirin 81 MG chewable tablet   labetalol 200 MG tablet Commonly known as: NORMODYNE       TAKE these medications    Acetaminophen Extra Strength 500 MG tablet Generic drug: acetaminophen Take 2 tablets (1,000 mg total) by mouth every 6 (six) hours as needed for mild pain.   FeroSul 325 (65 FE) MG tablet Generic drug: ferrous sulfate Take 1 tablet (325 mg total) by mouth every other day.   NIFEdipine 30 MG 24 hr tablet Commonly known as: ADALAT CC Take 1 tablet (30 mg total) by mouth daily. What changed:  medication strength how much to take   oxyCODONE 5 MG immediate release tablet Commonly known as: Oxy IR/ROXICODONE Take 1-2 tablets (5-10 mg total) by mouth every 4 (four) hours as needed for severe pain.   PRENATAL VITAMIN PO Take by mouth.   Senexon-S 8.6-50 MG  tablet Generic drug: senna-docusate Take 2 tablets by mouth at bedtime as needed for mild constipation or moderate constipation.               Discharge Care Instructions  (From admission, onward)           Start     Ordered   05/09/21 0000  Discharge wound care:       Comments: As per discharge handout and nursing instructions   05/09/21 1044            Discharge home in stable condition Infant Feeding: Breast Infant Disposition:home  with mother Discharge instruction: per After Visit Summary and Postpartum booklet. Activity: Advance as tolerated. Pelvic rest for 6 weeks.  Diet: routine diet Future Appointments: Future Appointments  Date Time Provider [place]  05/10/2021 10:30 AM [facility] NURSE [facility]  05/17/2021  9:30 AM Janyth Pupa, DO [facility]  06/12/2021  1:50 PM Roma Schanz, CNM [facility]   Follow up Visit:   Please schedule this patient for a In person postpartum visit in 4 weeks with the following provider: MD. Additional Postpartum F/U:Incision check 1 week  High risk pregnancy complicated by:  pre eclampsia with severe feature Delivery mode:  C-Section, Low Transverse  Anticipated Birth Control:  Unsure   05/09/2021 Verita Schneiders, MD

## 2021-05-06 NOTE — Progress Notes (Signed)
L&D Note  05/06/2021 - 8:58 AM  25 y.o. G1 [redacted]w[redacted]d. Pregnancy complicated by cHTN with superimposed mild pre-eclampsia.  Patient Active Problem List   Diagnosis Date Noted   Chorioamnionitis 05/06/2021   Chronic hypertension with superimposed preeclampsia 05/03/2021   Polyhydramnios in third trimester 03/01/2021   Supervision of high-risk pregnancy 11/07/2020   Acute cyclitis 08/23/2020   Vitamin D deficiency 04/10/2020   Migraine 04/10/2020   Hypertension 02/11/2018   Obesity (BMI 30-39.9) 01/21/2018    Bonnie Long is admitted for IOL on 8/4 just after midnight   Subjective:  Not feeling any UCs. No s/s of pre-eclampsia Objective:   Vitals:   05/06/21 0700 05/06/21 0730 05/06/21 0820 05/06/21 0835  BP: 130/75 113/62  129/81  Pulse: (!) 101 (!) 113    Resp: 17 16 20    Temp:   (!) 102.9 F (39.4 C)   TempSrc:   Oral   SpO2:      Weight:      Height:        Current Vital Signs 24h Vital Sign Ranges  T (!) 102.9 F (39.4 C)  Temp  Avg: 98.8 F (37.1 C)  Min: 97.9 F (36.6 C)  Max: 102.9 F (39.4 C)  BP 129/81 BP  Min: 103/60  Max: 150/106  HR (!) 113  Pulse  Avg: 92.9  Min: 64  Max: 115  RR 20 Resp  Avg: 17.9  Min: 16  Max: 22  SaO2 98 %   SpO2  Avg: 98.9 %  Min: 98 %  Max: 100 %       24 Hour I/O Current Shift I/O  Time Ins Outs 08/06 0701 - 08/07 0700 In: 6085.8 [P.O.:595; I.V.:5391.4] Out: 2950 [Urine:2950] 08/07 0701 - 08/07 1900 In: -  Out: 125 [Urine:125]   UOP: 75-189mL/hr  FHR: 170s, no accels or decels, mod variability Toco: q8m Gen: NAD SVE: deferred. 6cm at 0630 with some cervical edema per RN  Labs:  Recent Labs  Lab 05/04/21 1543 05/04/21 2040 05/05/21 2153  WBC 10.8* 11.8* 15.2*  HGB 10.8* 11.3* 11.4*  HCT 33.1* 34.4* 35.1*  PLT 231 261 261   Recent Labs  Lab 05/03/21 0050 05/04/21 2040 05/05/21 2153  NA 133* 136 132*  K 3.8 3.9 3.7  CL 103 104 101  CO2 21* 24 20*  BUN 12 9 8   CREATININE 0.91 0.99 0.92  CALCIUM 9.1  9.2 9.0  PROT 6.7 6.7  --   BILITOT 0.5 0.6  --   ALKPHOS 143* 156*  --   ALT 14 15  --   AST 19 20  --   GLUCOSE 89 97 94    Medications Current Facility-Administered Medications  Medication Dose Route Frequency Provider Last Rate Last Admin   acetaminophen (TYLENOL) tablet 650 mg  650 mg Oral Q4H PRN 2154, DO       ampicillin (OMNIPEN) 2 g in sodium chloride 0.9 % 100 mL IVPB  2 g Intravenous Q6H Emly, Jessica, CNM 300 mL/hr at 05/06/21 0848 2 g at 05/06/21 0848   diphenhydrAMINE (BENADRYL) injection 12.5 mg  12.5 mg Intravenous Q15 min PRN 07/06/21, MD   12.5 mg at 05/06/21 Lowella Curb   ePHEDrine injection 10 mg  10 mg Intravenous PRN 07/06/21, MD       ePHEDrine injection 10 mg  10 mg Intravenous PRN 5053, MD       fentaNYL (SUBLIMAZE) injection 50-100 mcg  50-100  mcg Intravenous Q1H PRN Myna Hidalgo, DO   100 mcg at 05/05/21 2039   fentaNYL 2 mcg/mL w/ bupivacaine 0.125% in NS 250 mL epidural infusion  12 mL/hr Epidural Continuous PRN Lowella Curb, MD 12 mL/hr at 05/05/21 2243 12 mL/hr at 05/05/21 2243   gentamicin (GARAMYCIN) 390 mg in dextrose 5 % 100 mL IVPB  5 mg/kg (Adjusted) Intravenous Q24H [place] Bing, MD       labetalol (NORMODYNE) tablet 200 mg  200 mg Oral BID Edd Arbour R, CNM   200 mg at 05/05/21 2245   lactated ringers amnioinfusion   Intrauterine Continuous Gerrit Heck, CNM 125 mL/hr at 05/06/21 0214 New Bag at 05/06/21 0214   lactated ringers infusion 500-1,000 mL  500-1,000 mL Intravenous PRN Myna Hidalgo, DO 999 mL/hr at 05/03/21 1248 1,000 mL at 05/03/21 1248   lactated ringers infusion   Intravenous Continuous Myna Hidalgo, DO 125 mL/hr at 05/05/21 2314 New Bag at 05/05/21 2314   lidocaine (PF) (XYLOCAINE) 1 % injection 30 mL  30 mL Subcutaneous PRN Myna Hidalgo, DO       misoprostol (CYTOTEC) tablet 25 mcg  25 mcg Vaginal Q4H Thressa Sheller D, CNM   25 mcg at 05/04/21 1043   misoprostol (CYTOTEC)  tablet 50 mcg  50 mcg Buccal Q4H PRN Jacklyn Shell, CNM   50 mcg at 05/04/21 7858   NIFEdipine (PROCARDIA XL/NIFEDICAL-XL) 24 hr tablet 90 mg  90 mg Oral Daily Alfredo Martinez, MD   90 mg at 05/05/21 1006   ondansetron (ZOFRAN) injection 4 mg  4 mg Intravenous Q6H PRN Myna Hidalgo, DO       oxyCODONE-acetaminophen (PERCOCET/ROXICET) 5-325 MG per tablet 1 tablet  1 tablet Oral Q4H PRN Myna Hidalgo, DO       oxyCODONE-acetaminophen (PERCOCET/ROXICET) 5-325 MG per tablet 2 tablet  2 tablet Oral Q4H PRN Myna Hidalgo, DO       oxytocin (PITOCIN) IV BOLUS FROM BAG  333 mL Intravenous Once Myna Hidalgo, DO       oxytocin (PITOCIN) IV infusion 30 units in NS 500 mL - Premix  2.5 Units/hr Intravenous Continuous Myna Hidalgo, DO       oxytocin (PITOCIN) IV infusion 30 units in NS 500 mL - Premix  1-40 milli-units/min Intravenous Titrated Armando Reichert, CNM 12 mL/hr at 05/06/21 0723 12 milli-units/min at 05/06/21 0723   PHENYLephrine 40 mcg/ml in normal saline Adult IV Push Syringe (For Blood Pressure Support)  80 mcg Intravenous PRN Lowella Curb, MD       PHENYLephrine 40 mcg/ml in normal saline Adult IV Push Syringe (For Blood Pressure Support)  80 mcg Intravenous PRN Lowella Curb, MD       sodium citrate-citric acid (ORACIT) solution 30 mL  30 mL Oral Q2H PRN Myna Hidalgo, DO       terbutaline (BRETHINE) injection 0.25 mg  0.25 mg Subcutaneous Once PRN Myna Hidalgo, DO       terbutaline (BRETHINE) injection 0.25 mg  0.25 mg Subcutaneous Once PRN Armando Reichert, CNM       Facility-Administered Medications Ordered in Other Encounters  Medication Dose Route Frequency Provider Last Rate Last Admin   lidocaine (PF) (XYLOCAINE) 1 % injection   Epidural Anesthesia Intra-op Leilani Able, MD   12 mL at 05/05/21 2235    Assessment & Plan:  Patient with chorio *IUP: category II but moderate variability *cHTN with mild pre-eclampsia: continue po meds. F/u lab recheck  this morning *IOL: continue pit per  per protocol. IUPC in place. d/w her that I recommend SVE after abx are in and if unchanged then to proceed with c-section. Pt amenable to plan *Chorio: amp/gent, IVF bolus and apap ordered *GBS: neg *Analgesia: epidural working well  Cornelia Copa. MD Attending Center for [facility])

## 2021-05-06 NOTE — Progress Notes (Addendum)
Bonnie Long MRN: [id number]  Subjective: -Patient resting in bed.  Provider awakens with gentle touch.  Patient states she is having no pain.   Objective: BP 117/71   Pulse 97   Temp 98.4 F (36.9 C) (Oral)   Resp 17   Ht 5\' 3"  (1.6 m)   Wt 117.3 kg   LMP 08/16/2020 (Exact Date)   SpO2 98%   BMI 45.79 kg/m  I/O last 3 completed shifts: In: 1773.6 [P.O.:420; I.V.:1353.6] Out: 1600 [Urine:1600] Total I/O In: 2420 [P.O.:50; I.V.:2363.7; Other:6.3] Out: 1000 [Urine:1000]  Fetal Monitoring: FHT: 145 bpm, Mod Var, -Decels, -Accels UC: Palpates mild    Vaginal Exam: SVE:   Dilation: 6.5 Effacement (%): 90 Station: -1, 0 Exam by:: Velora Horstman CNM Membranes:AROM x 12 hrs Internal Monitors: IUPC repositioned  Augmentation/Induction: Pitocin:25mUn/min Cytotec: S/p 8 Doses  Assessment:  IUP at 37.4 weeks Cat I FT  IOL  Plan: -Cervical exam performed and reviewed. -Infant responsive to intrauterine resuscitative measures. -Will start to titrate pitocin per protocol.  -Continue other mgmt as ordered   4m, CNM Advanced Practice Provider, Center for [facility] 05/06/2021, 4:28 AM  Addendum 4:58 AM  -Strip reviewed with Dr. 07/06/2021. -Will continue to monitor.   Macon Large MSN, CNM Advanced Practice Provider, Center for [facility]

## 2021-05-06 NOTE — Op Note (Addendum)
Operative Note   SURGERY DATE: 05/06/2021  PRE-OP DIAGNOSIS:  Pregnancy at 37/4 weeks  Chorioamnionitis Arrest of Dilation at 6-7cm Severe Pre-eclampsia (elevated creatinine and oliguria) superimposed on chronic hypertension  POST-OP DIAGNOSIS: Same, delivered, postpartum hemorrhage  PROCEDURE: primary low transverse cesarean section via pfannenstiel skin incision with double layer uterine closure  SURGEON: Surgeon(s) and Role:    [place] Bing, MD - Primary  ASSISTANT: Warner Mccreedy, MD  ANESTHESIA: spinal  ESTIMATED BLOOD LOSS:  1200cc  DRAINS: 150 mL UOP via indwelling foley  TOTAL IV FLUIDS: 2400 mL crystalloid  VTE PROPHYLAXIS: SCDs to bilateral lower extremities  ANTIBIOTICS: Patient was already receiving ampicillin and gentamycin for chorioaminiotis. Ancef 2gm and clindamycin 900mg  IV given within one hour of skin incision and azithromycin 500mg  IV x 1 given after these. Plan is for 24 hours of amp, gent, and clinda  SPECIMENS: Placenta to path  COMPLICATIONS: None  FINDINGS: No intra-abdominal adhesions were noted. Grossly normal uterus, tubes and ovaries. Clear amniotic fluid, cephalic female infant, weight 2925 gm, APGARs 8/9, intact placenta.  PROCEDURE IN DETAIL: The patient was taken to the operating room where anesthesia was administered and normal fetal heart tones were confirmed. She was then prepped and draped in the normal fashion in the dorsal supine position with a leftward tilt.  After a time out was performed, a pfannensteil  skin incision was made with the scalpel and carried through to the underlying layer of fascia. The fascia was then incised at the midline and this incision was extended laterally with the mayo scissors. Attention was turned to the superior aspect of the fascial incision which was grasped with the kocher clamps x 2, tented up and the rectus muscles were dissected off with blunt dissection. In a similar fashion the inferior aspect of the  fascial incision was grasped with the kocher clamps, tented up and the rectus muscles dissected. The rectus muscles were then separated in the midline and the peritoneum was entered bluntly. The Alexis retractor was then placed  A low transverse hysterotomy was made with the scalpel until the endometrial cavity was breached and the amniotic sac ruptured with the Allis clamp, yielding clear amniotic fluid. This incision was extended bluntly and the infant's head, shoulders and body were delivered atraumatically.The cord was clamped x 2 and cut, and the infant was handed to the awaiting pediatricians, after delayed cord clamping was done.  The placenta was then gradually expressed from the uterus and then the uterus was exteriorized and cleared of all clots and debris. The hysterotomy was repaired with a running suture of 1-0 Vicryl. A second imbricating layer of 1-0 Vicryl suture was then placed.   The uterus and adnexa were then returned to the abdomen, and the hysterotomy and all operative sites were reinspected and excellent hemostasis was noted after irrigation and suction of the abdomen with warm saline.  The fascia was reapproximated with 0 Vicryl in a simple running fashion bilaterally. The subcutaneous layer was then reapproximated with running suture of 2-0 plain gut, and the skin was then closed with 4-0 vicryl  in a subcuticular fashion.  The patient  tolerated the procedure well. Sponge, lap, needle, and instrument counts were correct x 2. The patient was transferred to the recovery room awake, alert and breathing independently in stable condition.  , MD, MPH OB Fellow, [facility] for [facility] Healthcare [facility])  Agree with above. I was present and scrubbed for the entire procedure.   [facility]  Lonia Skinner MD Attending Center for [facility])

## 2021-05-06 NOTE — Brief Op Note (Signed)
05/06/2021  12:54 PM  PATIENT:  Bonnie Long  25 y.o. female  PRE-OPERATIVE DIAGNOSIS:  Chorioaminitis, Arrest of Dilation, severe pre-eclampsia, AKI  POST-OPERATIVE DIAGNOSIS:  Same. delivered  PROCEDURE:  Procedure(s): CESAREAN SECTION (N/A)  SURGEON:  Surgeon(s) and Role:    * [place] Bing, MD - Primary  PHYSICIAN ASSISTANT:   ANESTHESIA:   epidural  EBL:  1200 mL   BLOOD ADMINISTERED:none  DRAINS:  indwelling foley.    LOCAL MEDICATIONS USED:  NONE  SPECIMEN:  placenta  DISPOSITION OF SPECIMEN:  PATHOLOGY  COUNTS:  YES  TOURNIQUET:  * No tourniquets in log *  DICTATION: .Note written in EPIC  PLAN OF CARE:  admit to postpartum (1st floor)  PATIENT DISPOSITION:  PACU - hemodynamically stable.   Delay start of Pharmacological VTE agent (>24hrs) due to surgical blood loss or risk of bleeding: yes  Cornelia Copa MD Attending Center for [facility] Healthcare (Faculty Practice) GYN Consult Phone: [phone] (M-F, [phone]) & [phone] (Off hours, weekends, holidays)

## 2021-05-06 NOTE — Progress Notes (Signed)
Pharmacy Antibiotic Note  Bonnie Long is a 25 y.o. female admitted on 05/03/2021 with  suspected triple I .  Pharmacy has been consulted for gentamicin dosing.  Plan: Start Gentamicin 5 mg/kg q24h Consider obtaining levels if continues >48h or renal function changes.  Height: 5\' 3"  (160 cm) Weight: 117.3 kg (258 lb 8 oz) IBW/kg (Calculated) : 52.4  Temp (24hrs), Avg:98.8 F (37.1 C), Min:97.9 F (36.6 C), Max:102.9 F (39.4 C)  Recent Labs  Lab 05/03/21 0050 05/04/21 1543 05/04/21 2040 05/05/21 2153  WBC 9.9 10.8* 11.8* 15.2*  CREATININE 0.91  --  0.99 0.92    Estimated Creatinine Clearance: 117.7 mL/min (by C-G formula based on SCr of 0.92 mg/dL).    No Known Allergies  Antimicrobials this admission: Ampicillin 2 g IV q6h (8/7>>  Thank you for allowing pharmacy to be a part of this patient's care.  07/05/21, PharmD, MHSA, BCPPS 05/06/2021 8:41 AM

## 2021-05-06 NOTE — Transfer of Care (Signed)
Immediate Anesthesia Transfer of Care Note  Patient: Bonnie Long  Procedure(s) Performed: CESAREAN SECTION  Patient Location: PACU  Anesthesia Type:Epidural  Level of Consciousness: drowsy  Airway & Oxygen Therapy: Patient Spontanous Breathing  Post-op Assessment: Report given to RN  Post vital signs: Reviewed  Last Vitals:  Vitals Value Taken Time  BP 96/45 05/06/21 1321  Temp 38.5 C 05/06/21 1321  Pulse    Resp 15 05/06/21 1326  SpO2    Vitals shown include unvalidated device data.  Last Pain:  Vitals:   05/06/21 1321  TempSrc: Axillary  PainSc:       Patients Stated Pain Goal: 1 (05/04/21 0147)  Complications: No notable events documented.

## 2021-05-06 NOTE — Progress Notes (Signed)
L&D Note  05/06/2021 - 11:08 AM  25 y.o. G1 [redacted]w[redacted]d. Pregnancy complicated by cHTN with superimposed mild pre-eclampsia.  Patient Active Problem List   Diagnosis Date Noted   Chorioamnionitis 05/06/2021   Chronic hypertension with superimposed preeclampsia 05/03/2021   Polyhydramnios in third trimester 03/01/2021   Supervision of high-risk pregnancy 11/07/2020   Acute cyclitis 08/23/2020   Vitamin D deficiency 04/10/2020   Migraine 04/10/2020   Hypertension 02/11/2018   Obesity (BMI 30-39.9) 01/21/2018    [name] is admitted for IOL on 8/4 just after midnight   Subjective:  Not feeling any UCs. No s/s of pre-eclampsia Objective:   Vitals:   05/06/21 0931 05/06/21 1002 05/06/21 1028 05/06/21 1031  BP: 135/79 (!) 117/59  131/67  Pulse: (!) 124 (!) 139  (!) 140  Resp: 16  18 18   Temp:   (!) 100.6 F (38.1 C)   TempSrc:   Oral   SpO2:      Weight:      Height:        Current Vital Signs 24h Vital Sign Ranges  T (!) 100.6 F (38.1 C)  Temp  Avg: 99.4 F (37.4 C)  Min: 98.1 F (36.7 C)  Max: 102.9 F (39.4 C)  BP 131/67 BP  Min: 103/60  Max: 148/95  HR (!) 140  Pulse  Avg: 97.3  Min: 72  Max: 140  RR 18 Resp  Avg: 18  Min: 16  Max: 22  SaO2 98 %   SpO2  Avg: 98.9 %  Min: 98 %  Max: 100 %       24 Hour I/O Current Shift I/O  Time Ins Outs 08/06 0701 - 08/07 0700 In: 6085.8 [P.O.:595; I.V.:5391.4] Out: 2950 [Urine:2950] 08/07 0701 - 08/07 1900 In: -  Out: 125 [Urine:125]   UOP: 125 @ 0900  FHR: 160 baseline, ?rare accels or rare slight variables, min to mod variability Toco: q16m Gen: NAD SVE: unchanged at 6-7/thick anterior lip/-3/moderate caput.   Labs:  Recent Labs  Lab 05/04/21 2040 05/05/21 2153 05/06/21 0831  WBC 11.8* 15.2* 16.9*  HGB 11.3* 11.4* 11.2*  HCT 34.4* 35.1* 34.4*  PLT 261 261 267    Recent Labs  Lab 05/03/21 0050 05/04/21 2040 05/05/21 2153 05/06/21 0831  NA 133* 136 132* 135  K 3.8 3.9 3.7 4.3  CL 103 104 101 104   CO2 21* 24 20* 20*  BUN 12 9 8 11   CREATININE 0.91 0.99 0.92 1.65*  CALCIUM 9.1 9.2 9.0 9.0  PROT 6.7 6.7  --  6.5  BILITOT 0.5 0.6  --  1.4*  ALKPHOS 143* 156*  --  151*  ALT 14 15  --  11  AST 19 20  --  15  GLUCOSE 89 97 94 98     Medications Current Facility-Administered Medications  Medication Dose Route Frequency Provider Last Rate Last Admin   acetaminophen (TYLENOL) tablet 650 mg  650 mg Oral Q4H PRN 07/06/21, DO       ampicillin (OMNIPEN) 2 g in sodium chloride 0.9 % 100 mL IVPB  2 g Intravenous Q6H Emly, Jessica, CNM 300 mL/hr at 05/06/21 0848 2 g at 05/06/21 0848   diphenhydrAMINE (BENADRYL) injection 12.5 mg  12.5 mg Intravenous Q15 min PRN 07/06/21, MD   12.5 mg at 05/06/21 Lowella Curb   ePHEDrine injection 10 mg  10 mg Intravenous PRN 07/06/21, MD       ePHEDrine injection 10  mg  10 mg Intravenous PRN Lowella Curb, MD       fentaNYL (SUBLIMAZE) injection 50-100 mcg  50-100 mcg Intravenous Q1H PRN Myna Hidalgo, DO   100 mcg at 05/05/21 2039   fentaNYL 2 mcg/mL w/ bupivacaine 0.125% in NS 250 mL epidural infusion  12 mL/hr Epidural Continuous PRN Lowella Curb, MD 12 mL/hr at 05/05/21 2243 12 mL/hr at 05/05/21 2243   gentamicin (GARAMYCIN) 390 mg in dextrose 5 % 100 mL IVPB  5 mg/kg (Adjusted) Intravenous Q24H  Bing, MD 109.8 mL/hr at 05/06/21 0929 390 mg at 05/06/21 0929   labetalol (NORMODYNE) tablet 200 mg  200 mg Oral BID Edd Arbour R, CNM   200 mg at 05/06/21 1028   lactated ringers amnioinfusion   Intrauterine Continuous Gerrit Heck, CNM 125 mL/hr at 05/06/21 0214 New Bag at 05/06/21 0214   lactated ringers infusion 500-1,000 mL  500-1,000 mL Intravenous PRN Myna Hidalgo, DO 999 mL/hr at 05/03/21 1248 1,000 mL at 05/03/21 1248   lactated ringers infusion   Intravenous Continuous Myna Hidalgo, DO 125 mL/hr at 05/05/21 2314 New Bag at 05/05/21 2314   lidocaine (PF) (XYLOCAINE) 1 % injection 30 mL  30 mL Subcutaneous  PRN Myna Hidalgo, DO       misoprostol (CYTOTEC) tablet 25 mcg  25 mcg Vaginal Q4H Thressa Sheller D, CNM   25 mcg at 05/04/21 1043   misoprostol (CYTOTEC) tablet 50 mcg  50 mcg Buccal Q4H PRN Jacklyn Shell, CNM   50 mcg at 05/04/21 1696   NIFEdipine (PROCARDIA XL/NIFEDICAL-XL) 24 hr tablet 90 mg  90 mg Oral Daily Alfredo Martinez, MD   90 mg at 05/06/21 1029   ondansetron (ZOFRAN) injection 4 mg  4 mg Intravenous Q6H PRN Myna Hidalgo, DO       oxyCODONE-acetaminophen (PERCOCET/ROXICET) 5-325 MG per tablet 1 tablet  1 tablet Oral Q4H PRN Myna Hidalgo, DO       oxyCODONE-acetaminophen (PERCOCET/ROXICET) 5-325 MG per tablet 2 tablet  2 tablet Oral Q4H PRN Myna Hidalgo, DO       oxytocin (PITOCIN) IV BOLUS FROM BAG  333 mL Intravenous Once Myna Hidalgo, DO       oxytocin (PITOCIN) IV infusion 30 units in NS 500 mL - Premix  2.5 Units/hr Intravenous Continuous Myna Hidalgo, DO       oxytocin (PITOCIN) IV infusion 30 units in NS 500 mL - Premix  1-40 milli-units/min Intravenous Titrated Armando Reichert, CNM   Stopped at 05/06/21 1105   PHENYLephrine 40 mcg/ml in normal saline Adult IV Push Syringe (For Blood Pressure Support)  80 mcg Intravenous PRN Lowella Curb, MD       PHENYLephrine 40 mcg/ml in normal saline Adult IV Push Syringe (For Blood Pressure Support)  80 mcg Intravenous PRN Lowella Curb, MD       sodium citrate-citric acid (ORACIT) solution 30 mL  30 mL Oral Q2H PRN Myna Hidalgo, DO       terbutaline (BRETHINE) injection 0.25 mg  0.25 mg Subcutaneous Once PRN Myna Hidalgo, DO       terbutaline (BRETHINE) injection 0.25 mg  0.25 mg Subcutaneous Once PRN Armando Reichert, CNM       Facility-Administered Medications Ordered in Other Encounters  Medication Dose Route Frequency Provider Last Rate Last Admin   lidocaine (PF) (XYLOCAINE) 1 % injection   Epidural Anesthesia Intra-op Leilani Able, MD   12 mL at 05/05/21 2235    Assessment & Plan:  Patient with chorio *IUP: category II but reassuring overall *cHTN with severe pre-eclampsia (AKI, BPs): AKI now and decreased UOP. Pt s/p IVF bolus with abx. Will hold off on anymore for now since she will get some with her c-section and risk of fluid overload. Will do Mg 4g load and 1 per hour and check labs, including Mg level q4h.  continue po meds.  *IOL:  pt amenable to c-section for arrest of dilation. d/c pit. IUPC in place. Pt amenable to plan *Chorio: amp/gent. Will add ancef for c/s and clinda and azithro. Plan is for amp/gent/clinda x 24hrs post op *GBS: neg *Analgesia: epidural working well  Cornelia Copa. MD Attending Center for [facility])

## 2021-05-07 ENCOUNTER — Other Ambulatory Visit: Payer: 59

## 2021-05-07 LAB — COMPREHENSIVE METABOLIC PANEL
ALT: 12 U/L (ref 0–44)
ALT: 13 U/L (ref 0–44)
AST: 22 U/L (ref 15–41)
AST: 25 U/L (ref 15–41)
Albumin: 2.1 g/dL — ABNORMAL LOW (ref 3.5–5.0)
Albumin: 2.1 g/dL — ABNORMAL LOW (ref 3.5–5.0)
Alkaline Phosphatase: 93 U/L (ref 38–126)
Alkaline Phosphatase: 94 U/L (ref 38–126)
Anion gap: 6 (ref 5–15)
Anion gap: 7 (ref 5–15)
BUN: 14 mg/dL (ref 6–20)
BUN: 17 mg/dL (ref 6–20)
CO2: 23 mmol/L (ref 22–32)
CO2: 23 mmol/L (ref 22–32)
Calcium: 8 mg/dL — ABNORMAL LOW (ref 8.9–10.3)
Calcium: 8.1 mg/dL — ABNORMAL LOW (ref 8.9–10.3)
Chloride: 104 mmol/L (ref 98–111)
Chloride: 105 mmol/L (ref 98–111)
Creatinine, Ser: 1.57 mg/dL — ABNORMAL HIGH (ref 0.44–1.00)
Creatinine, Ser: 1.71 mg/dL — ABNORMAL HIGH (ref 0.44–1.00)
GFR, Estimated: 43 mL/min — ABNORMAL LOW (ref >60)
GFR, Estimated: 47 mL/min — ABNORMAL LOW (ref >60)
Glucose, Bld: 106 mg/dL — ABNORMAL HIGH (ref 70–99)
Glucose, Bld: 122 mg/dL — ABNORMAL HIGH (ref 70–99)
Potassium: 4.3 mmol/L (ref 3.5–5.1)
Potassium: 4.4 mmol/L (ref 3.5–5.1)
Sodium: 134 mmol/L — ABNORMAL LOW (ref 135–145)
Sodium: 134 mmol/L — ABNORMAL LOW (ref 135–145)
Total Bilirubin: 1.1 mg/dL (ref 0.3–1.2)
Total Bilirubin: 1.9 mg/dL — ABNORMAL HIGH (ref 0.3–1.2)
Total Protein: 5 g/dL — ABNORMAL LOW (ref 6.5–8.1)
Total Protein: 5.2 g/dL — ABNORMAL LOW (ref 6.5–8.1)

## 2021-05-07 LAB — BPAM RBC
Blood Product Expiration Date: 202208312359
Blood Product Expiration Date: 202208312359
ISSUE DATE / TIME: 202208071713
ISSUE DATE / TIME: 202208081141
Unit Type and Rh: 6200
Unit Type and Rh: 6200

## 2021-05-07 LAB — TYPE AND SCREEN
ABO/RH(D): A POS
Antibody Screen: NEGATIVE
Unit division: 0
Unit division: 0

## 2021-05-07 LAB — CBC
HCT: 23.4 % — ABNORMAL LOW (ref 36.0–46.0)
HCT: 24.6 % — ABNORMAL LOW (ref 36.0–46.0)
Hemoglobin: 7.5 g/dL — ABNORMAL LOW (ref 12.0–15.0)
Hemoglobin: 7.9 g/dL — ABNORMAL LOW (ref 12.0–15.0)
MCH: 26.5 pg (ref 26.0–34.0)
MCH: 26.7 pg (ref 26.0–34.0)
MCHC: 32.1 g/dL (ref 30.0–36.0)
MCHC: 32.1 g/dL (ref 30.0–36.0)
MCV: 82.7 fL (ref 80.0–100.0)
MCV: 83.1 fL (ref 80.0–100.0)
Platelets: 157 10*3/uL (ref 150–400)
Platelets: 163 10*3/uL (ref 150–400)
RBC: 2.83 MIL/uL — ABNORMAL LOW (ref 3.87–5.11)
RBC: 2.96 MIL/uL — ABNORMAL LOW (ref 3.87–5.11)
RDW: 14.8 % (ref 11.5–15.5)
RDW: 14.9 % (ref 11.5–15.5)
WBC: 27.6 10*3/uL — ABNORMAL HIGH (ref 4.0–10.5)
WBC: 29.1 10*3/uL — ABNORMAL HIGH (ref 4.0–10.5)
nRBC: 0 % (ref 0.0–0.2)
nRBC: 0 % (ref 0.0–0.2)

## 2021-05-07 LAB — PREPARE RBC (CROSSMATCH)

## 2021-05-07 LAB — MAGNESIUM
Magnesium: 3.8 mg/dL — ABNORMAL HIGH (ref 1.7–2.4)
Magnesium: 4.7 mg/dL — ABNORMAL HIGH (ref 1.7–2.4)

## 2021-05-07 MED ORDER — SODIUM CHLORIDE 0.9% IV SOLUTION: Freq: Once | INTRAVENOUS | Status: AC

## 2021-05-07 MED ORDER — ACETAMINOPHEN 500 MG PO TABS
1000.0000 mg | ORAL_TABLET | Freq: Four times a day (QID) | ORAL | Status: DC | PRN
  Administered 2021-05-07 – 2021-05-08 (×3): 1000 mg via ORAL
  Filled 2021-05-07 (×4): qty 2

## 2021-05-07 MED ORDER — SODIUM CHLORIDE 0.9 % IV SOLN
500.0000 mg | Freq: Once | INTRAVENOUS | Status: AC
  Administered 2021-05-08: 500 mg via INTRAVENOUS
  Filled 2021-05-07 (×3): qty 25

## 2021-05-07 MED ORDER — OXYCODONE HCL 5 MG PO TABS
5.0000 mg | ORAL_TABLET | ORAL | Status: DC | PRN
  Administered 2021-05-07: 5 mg via ORAL
  Administered 2021-05-08: 10 mg via ORAL
  Administered 2021-05-08: 5 mg via ORAL
  Administered 2021-05-08 – 2021-05-09 (×2): 10 mg via ORAL
  Filled 2021-05-07: qty 1
  Filled 2021-05-07 (×3): qty 2
  Filled 2021-05-07: qty 1

## 2021-05-07 MED ORDER — ACETAMINOPHEN 325 MG PO TABS: 650.0000 mg | ORAL_TABLET | Freq: Four times a day (QID) | ORAL | Status: DC | PRN

## 2021-05-07 MED ORDER — OXYCODONE HCL 5 MG PO TABS: 5.0000 mg | ORAL_TABLET | ORAL | Status: DC | PRN

## 2021-05-07 NOTE — Progress Notes (Signed)
Daily Antepartum Note  Admission Date: 05/03/2021 Current Date: 05/07/2021 7:55 AM  Bonnie Long is a 25 y.o. G1P1001 POD#1 s/p pLTCS @ 56w4dfor arrest of dilation, admitted for cHTN with mild pre-eclampsia IOL.  Pregnancy complicated by: Patient Active Problem List   Diagnosis Date Noted   Chorioamnionitis 05/06/2021   Severe pre-eclampsia 05/06/2021   Encounter for supervision of high risk pregnancy in third trimester, antepartum 05/06/2021   Chronic hypertension with superimposed preeclampsia 05/03/2021   Polyhydramnios in third trimester 03/01/2021   Supervision of high-risk pregnancy 11/07/2020   Acute cyclitis 08/23/2020   Vitamin D deficiency 04/10/2020   Migraine 04/10/2020   Hypertension 02/11/2018   Obesity (BMI 30-39.9) 01/21/2018    Overnight events:  None   Subjective:  No s/s of pre-eclampsia. Pt feels well. No flatus yet  Objective:    Current Vital Signs 24h Vital Sign Ranges  T 97.9 F (36.6 C) Temp  Avg: 99.1 F (37.3 C)  Min: 97.6 F (36.4 C)  Max: 102.9 F (39.4 C)  BP (!) 108/49  BP  Min: 81/45  Max: 148/95  HR 72 Pulse  Avg: 105.8  Min: 71  Max: 140  RR 18 Resp  Avg: 16.4  Min: 0  Max: 26  SaO2 94 % Room Air SpO2  Avg: 95.2 %  Min: 82 %  Max: 99 %       24 Hour I/O Current Shift I/O  Time Ins Outs 08/07 0701 - 08/08 0700 In: 4952.2 [I.V.:3947.2] Out: 3187 [Urine:1350] No intake/output data recorded.   Patient Vitals for the past 24 hrs:  BP Temp Temp src Pulse Resp SpO2  05/07/21 0700 -- -- -- -- -- 94 %  05/07/21 0644 -- -- -- -- -- 95 %  05/07/21 0613 (!) 108/49 97.9 F (36.6 C) Oral 72 -- --  05/07/21 0422 (!) 107/55 97.6 F (36.4 C) Oral 71 18 97 %  05/07/21 0212 122/62 -- -- 76 16 99 %  05/06/21 2356 (!) 99/58 97.6 F (36.4 C) Oral 76 18 95 %  05/06/21 2045 115/67 97.6 F (36.4 C) Oral 75 16 --  05/06/21 1947 105/65 97.9 F (36.6 C) Oral 78 16 97 %  05/06/21 1829 (!) 103/54 97.8 F (36.6 C) Oral 94 18 97 %  05/06/21 1754  -- -- -- -- -- 96 %  05/06/21 1749 -- -- -- -- -- 96 %  05/06/21 1745 103/66 98.2 F (36.8 C) Oral 96 18 96 %  05/06/21 1739 -- -- -- -- -- 98 %  05/06/21 1734 -- -- -- -- -- 97 %  05/06/21 1730 (!) 98/57 98.1 F (36.7 C) -- (!) 101 18 99 %  05/06/21 1729 -- 98.1 F (36.7 C) Oral -- 16 99 %  05/06/21 1728 (!) 98/57 -- -- (!) 101 -- --  05/06/21 1724 -- -- -- -- -- 97 %  05/06/21 1719 -- -- -- -- -- 95 %  05/06/21 1709 -- -- -- -- -- 99 %  05/06/21 1708 (!) 98/50 98.1 F (36.7 C) Oral 100 16 99 %  05/06/21 1704 -- -- -- -- -- 96 %  05/06/21 1659 -- -- -- -- -- 97 %  05/06/21 1654 -- -- -- -- -- 98 %  05/06/21 1649 -- -- -- -- -- 98 %  05/06/21 1644 -- -- -- -- -- 98 %  05/06/21 1639 -- -- -- -- -- 93 %  05/06/21 1634 -- -- -- -- -- 93 %  05/06/21 1629 -- -- -- -- -- 91 %  05/06/21 1628 (!) 93/47 99 F (37.2 C) Oral (!) 104 -- 91 %  05/06/21 1610 -- -- -- (!) 111 17 95 %  05/06/21 1607 (!) 88/51 98.1 F (36.7 C) Axillary (!) 108 16 96 %  05/06/21 1606 -- -- -- (!) 114 (!) 21 92 %  05/06/21 1605 -- -- -- (!) 110 15 93 %  05/06/21 1604 -- -- -- (!) 114 (!) 24 92 %  05/06/21 1603 -- -- -- (!) 112 15 90 %  05/06/21 1602 -- -- -- (!) 109 (!) 23 92 %  05/06/21 1601 -- -- -- (!) 113 10 91 %  05/06/21 1600 -- -- -- (!) 111 17 94 %  05/06/21 1555 -- -- -- (!) 108 (!) 9 94 %  05/06/21 1550 -- -- -- (!) 107 18 90 %  05/06/21 1545 (!) 90/48 -- -- (!) 104 (!) 0 93 %  05/06/21 1540 -- -- -- (!) 108 (!) 7 92 %  05/06/21 1535 -- -- -- (!) 113 19 96 %  05/06/21 1530 (!) 90/45 -- -- (!) 114 18 95 %  05/06/21 1525 (!) 87/47 -- -- (!) 111 12 97 %  05/06/21 1520 -- -- -- (!) 105 20 91 %  05/06/21 1515 -- -- -- (!) 110 16 97 %  05/06/21 1510 (!) 87/44 -- -- (!) 111 11 94 %  05/06/21 1505 -- -- -- (!) 110 18 94 %  05/06/21 1500 (!) 95/49 (!) 100.8 F (38.2 C) Oral (!) 111 14 94 %  05/06/21 1455 -- -- -- (!) 114 (!) 23 98 %  05/06/21 1450 (!) 89/47 -- -- (!) 113 17 96 %  05/06/21 1445 -- --  -- (!) 107 14 97 %  05/06/21 1440 (!) 89/44 -- -- (!) 116 (!) 25 98 %  05/06/21 1435 -- -- -- (!) 110 (!) 0 96 %  05/06/21 1430 (!) 81/45 -- -- (!) 108 (!) 0 96 %  05/06/21 1425 (!) 89/49 -- -- (!) 112 13 97 %  05/06/21 1420 -- -- -- (!) 110 14 97 %  05/06/21 1415 (!) 83/50 -- -- (!) 110 (!) 8 97 %  05/06/21 1410 -- -- -- (!) 108 14 97 %  05/06/21 1405 -- -- -- (!) 105 16 97 %  05/06/21 1400 (!) 85/40 -- -- (!) 103 (!) 26 97 %  05/06/21 1355 -- -- -- (!) 109 (!) 22 95 %  05/06/21 1350 (!) 89/56 -- -- (!) 109 19 98 %  05/06/21 1345 (!) 90/47 -- -- 86 (!) 21 (!) 82 %  05/06/21 1340 100/61 -- -- -- (!) 21 --  05/06/21 1335 -- -- -- -- 19 --  05/06/21 1330 102/60 -- -- -- (!) 21 --  05/06/21 1325 -- -- -- -- 19 --  05/06/21 1322 -- -- -- -- (!) 24 --  05/06/21 1321 (!) 96/45 (!) 101.3 F (38.5 C) Axillary -- -- --  05/06/21 1102 -- -- -- -- 18 --  05/06/21 1031 131/67 -- -- (!) 140 18 --  05/06/21 1028 -- (!) 100.6 F (38.1 C) Oral -- 18 --  05/06/21 1002 (!) 117/59 -- -- (!) 139 -- --  05/06/21 0931 135/79 -- -- (!) 124 16 --  05/06/21 0925 -- (!) 102.3 F (39.1 C) Oral -- 18 --  05/06/21 0900 (!) 148/95 -- -- (!) 119 18 --  05/06/21 0835 129/81 -- -- -- -- --  05/06/21 0820 -- (!) 102.9 F (39.4 C) Oral -- 20 --  UOP: 75-171m/hr  Physical exam: General: Well nourished, well developed female in no acute distress. Abdomen: obese, nttp, c/d/I honeycomb dressing Cardiovascular: S1, S2 normal, no murmur, rub or gallop, regular rate and rhythm Respiratory: CTAB Extremities: no clubbing, cyanosis or edema Skin: Warm and dry.   Medications: Current Facility-Administered Medications  Medication Dose Route Frequency Provider Last Rate Last Admin   ampicillin (OMNIPEN) 2 g in sodium chloride 0.9 % 100 mL IVPB  2 g Intravenous Q6H DRenard Matter MD 300 mL/hr at 05/07/21 0158 2 g at 05/07/21 0158   coconut oil  1 application Topical PRN DRenard Matter MD       witch hazel-glycerin (TUCKS)  pad 1 application  1 application Topical PRN DRenard Matter MD       And   dibucaine (NUPERCAINAL) 1 % rectal ointment 1 application  1 application Rectal PRN DRenard Matter MD       diphenhydrAMINE (BENADRYL) injection 12.5 mg  12.5 mg Intravenous Q4H PRN MLynda Rainwater MD       Or   diphenhydrAMINE (BENADRYL) capsule 25 mg  25 mg Oral Q4H PRN MLynda Rainwater MD       diphenhydrAMINE (BENADRYL) capsule 25 mg  25 mg Oral Q6H PRN DRenard Matter MD       enoxaparin (LOVENOX) injection 60 mg  60 mg Subcutaneous Q24H DRenard Matter MD       hydrALAZINE (APRESOLINE) injection 5 mg  5 mg Intravenous PRN DRenard Matter MD       And   hydrALAZINE (APRESOLINE) injection 10 mg  10 mg Intravenous PRN DRenard Matter MD       And   labetalol (NORMODYNE) injection 20 mg  20 mg Intravenous PRN DRenard Matter MD       And   labetalol (NORMODYNE) injection 40 mg  40 mg Intravenous PRN DRenard Matter MD       lactated ringers infusion   Intravenous Continuous PAletha Halim MD 50 mL/hr at 05/06/21 2126 New Bag at 05/06/21 2126   magnesium sulfate 40 grams in SWI 1000 mL OB infusion  1 g/hr Intravenous Continuous PAletha Halim MD 25 mL/hr at 05/06/21 1229 1 g/hr at 05/06/21 1229   measles, mumps & rubella vaccine (MMR) injection 0.5 mL  0.5 mL Subcutaneous Once DRenard Matter MD       menthol-cetylpyridinium (CEPACOL) lozenge 3 mg  1 lozenge Oral Q2H PRN DRenard Matter MD       nalbuphine (NUBAIN) injection 5 mg  5 mg Intravenous Q4H PRN MLynda Rainwater MD       Or   nalbuphine (NUBAIN) injection 5 mg  5 mg Subcutaneous Q4H PRN MLynda Rainwater MD       nalbuphine (NUBAIN) injection 5 mg  5 mg Intravenous Once PRN MLynda Rainwater MD       Or   nalbuphine (NUBAIN) injection 5 mg  5 mg Subcutaneous Once PRN MLynda Rainwater MD       naloxone ([facility] injection 0.4 mg  0.4 mg Intravenous PRN MLynda Rainwater MD       And   sodium chloride flush (NS) 0.9 % injection 3 mL  3 mL Intravenous PRN MLynda Rainwater MD       naloxone HCl ([facility] 2 mg in dextrose 5 % 250 mL infusion  1-4 mcg/kg/hr Intravenous Continuous PRN MLynda Rainwater MD  oxytocin (PITOCIN) IV infusion 30 units in NS 500 mL - Premix  2.5 Units/hr Intravenous Continuous Renard Matter, MD       prenatal multivitamin tablet 1 tablet  1 tablet Oral Q1200 Renard Matter, MD       senna-docusate (Senokot-S) tablet 2 tablet  2 tablet Oral Daily Renard Matter, MD       simethicone [facility]) chewable tablet 80 mg  80 mg Oral TID PC Renard Matter, MD       simethicone [facility]) chewable tablet 80 mg  80 mg Oral PRN Renard Matter, MD       Tdap Durwin Reges) injection 0.5 mL  0.5 mL Intramuscular Once Renard Matter, MD        Labs:  Recent Labs  Lab 05/06/21 0831 05/06/21 1439 05/06/21 2357  WBC 16.9* 12.1* 29.1*  HGB 11.2* 7.3* 7.9*  HCT 34.4* 22.5* 24.6*  PLT 267 166 157    Recent Labs  Lab 05/06/21 0831 05/06/21 1439 05/06/21 2357  NA 135 135 134*  K 4.3 3.7 4.4  CL 104 107 105  CO2 20* 19* 23  BUN '11 12 14  ' CREATININE 1.65* 1.97* 1.71*  CALCIUM 9.0 7.9* 8.1*  PROT 6.5 4.4* 5.0*  BILITOT 1.4* 1.5* 1.9*  ALKPHOS 151* 101 94  ALT '11 10 12  ' AST 15 14* 22  GLUCOSE 98 91 122*     Radiology:  No new imaging  Assessment & Plan:  Pt improving *PP: breast. A pos. Desires circ. Getting 24hrs of abx due to chorio and c/s.  *CV: Mg until this afternoon. F/u labs at 10am *Anemia: stable/ f/u labs and s/s *PPx: SCDs. Start lovenox later this morning.  *FEN/GI: regular diet *Dispo: likely pod#3  Durene Romans MD Attending Center for [facility]) GYN Consult Phone: [phone] (M-F, [phone]) & [phone]  (Off hours, weekends, holidays)

## 2021-05-07 NOTE — Progress Notes (Signed)
CBC Latest Ref Rng & Units 05/07/2021 05/06/2021 05/06/2021  WBC 4.0 - 10.5 K/uL 27.6(H) 29.1(H) 12.1(H)  Hemoglobin 12.0 - 15.0 g/dL 7.5(L) 7.9(L) 7.3(L)  Hematocrit 36.0 - 46.0 % 23.4(L) 24.6(L) 22.5(L)  Platelets 150 - 400 K/uL 163 157 166   Patient counseled about Hgb 7.5. offered additional one unit of pRBCs and Venofer, she agreed with this plan.  These were ordered.   Jaynie Collins, MD

## 2021-05-07 NOTE — Lactation Note (Signed)
This note was copied from a baby's chart. Lactation Consultation Note  Patient Name: Bonnie Long [id number] Date: 05/07/2021 Reason for consult: Follow-up assessment;Primapara;Early term 37-38.6wks;1st time breastfeeding;Other (Comment) (baby just had a bath and is STS asleep with mom , has been receiviong donor milk in a bottle, LC encouraged mom to call for latch assist , per mom has only pumped a few times , no results.) Age:23 hours  Maternal Data    Feeding Mother's Current Feeding Choice: Breast Milk and Donor Milk Nipple Type: Dr. Cline Crock  [facility] Score                    Lactation Tools Discussed/Used Tools: Pump;Shells Breast pump type: Double-Electric Breast Pump  Interventions    Discharge    Consult Status Consult Status: Follow-up Date: 05/07/21 Follow-up type: In-patient    Matilde Sprang Arieliz Latino 05/07/2021, 4:29 PM

## 2021-05-07 NOTE — Lactation Note (Signed)
This note was copied from a baby's chart. Lactation Consultation Note Mom was worried d/t baby not feeding.  Newborn feeding habits, behavior, STS, I&O, positioning, support, supply and demand discussed.  DEBP is set up in rm. And has pumped 2 times. It is time for mom to pump now but mom stated she is very tired and would like to rest since baby is sleeping.  Suggested pumping every 3 hrs. Stated understood being tired and on Mag. Mom went on Mag. Post delivery. Mom has PPH explained to mom another reason to pump. Mom stated understood.  Placed baby at the breast baby latched once and suckled but wasn't deep. Nipple isn't that large but is full of edema and filled baby's mouth. Baby wouldn't open wide.no cueing noted. Not interested in BF at this time. Baby sleepy at the breast.  Doubled wrapped baby placed in bassinet. Shells to assist in softening breast tissue to wear in am.  Lactation brochure given.  Patient Name: Bonnie Long [id number] Date: 05/07/2021 Reason for consult: Initial assessment;Primapara;Early term 37-38.6wks Age:66 hours  Maternal Data Has patient been taught Hand Expression?: Yes Does the patient have breastfeeding experience prior to this delivery?: No  Feeding Mother's Current Feeding Choice: Breast Milk and Donor Milk  LATCH Score Latch: Too sleepy or reluctant, no latch achieved, no sucking elicited.  Audible Swallowing: None  Type of Nipple: Everted at rest and after stimulation  Comfort (Breast/Nipple): Filling, red/small blisters or bruises, mild/mod discomfort (edema)  Hold (Positioning): Full assist, staff holds infant at breast  LATCH Score: 3   Lactation Tools Discussed/Used Tools: Pump Breast pump type: Double-Electric Breast Pump Pump Education: Setup, frequency, and cleaning Pumping frequency: Q 3hr  Interventions Interventions: Breast feeding basics reviewed;Adjust position;Assisted with latch;Support pillows;Skin to  skin;Position options;Breast massage;Hand express;Breast compression;Reverse pressure;Pre-pump if needed;Shells  Discharge [facility] Program: No  Consult Status Consult Status: Follow-up Date: 05/07/21 Follow-up type: In-patient    Charyl Dancer 05/07/2021, 2:48 AM

## 2021-05-07 NOTE — Lactation Note (Addendum)
This note was copied from a baby's chart. Lactation Consultation Note  Patient Name: Bonnie Long [id number] Date: 05/07/2021   Age:23 hours  LC came to see mother to see if it was time for a feeding. Mom seen earlier by SLP provided with ultra preemie nipple. Infant fed 1 hr prior 15 ml of DBM.  Mom to pump with DEBP q 3 hrs for 15 min. Mom to offer EBM first followed by [facility]. Mom nipples show no signs of abrasion or trauma. Mom using 27 flanges stating comfortable fit.   If infant shows interest, Mom will call for latch assistance.   All questions answered at the end of the visit.     Maternal Data    Feeding Nipple Type: Dr. Cline Crock  [facility] Score                    Lactation Tools Discussed/Used    Interventions    Discharge    Consult Status      Bonnie Serviss  Long 05/07/2021, 7:46 PM

## 2021-05-08 LAB — COMPREHENSIVE METABOLIC PANEL
ALT: 12 U/L (ref 0–44)
AST: 24 U/L (ref 15–41)
Albumin: 2 g/dL — ABNORMAL LOW (ref 3.5–5.0)
Alkaline Phosphatase: 89 U/L (ref 38–126)
Anion gap: 6 (ref 5–15)
BUN: 21 mg/dL — ABNORMAL HIGH (ref 6–20)
CO2: 24 mmol/L (ref 22–32)
Calcium: 8.1 mg/dL — ABNORMAL LOW (ref 8.9–10.3)
Chloride: 104 mmol/L (ref 98–111)
Creatinine, Ser: 1.38 mg/dL — ABNORMAL HIGH (ref 0.44–1.00)
GFR, Estimated: 55 mL/min — ABNORMAL LOW (ref >60)
Glucose, Bld: 87 mg/dL (ref 70–99)
Potassium: 3.8 mmol/L (ref 3.5–5.1)
Sodium: 134 mmol/L — ABNORMAL LOW (ref 135–145)
Total Bilirubin: 1 mg/dL (ref 0.3–1.2)
Total Protein: 5 g/dL — ABNORMAL LOW (ref 6.5–8.1)

## 2021-05-08 LAB — BPAM RBC
Blood Product Expiration Date: 202209032359
ISSUE DATE / TIME: 202208081719
Unit Type and Rh: 6200

## 2021-05-08 LAB — CBC
HCT: 26.2 % — ABNORMAL LOW (ref 36.0–46.0)
Hemoglobin: 8.6 g/dL — ABNORMAL LOW (ref 12.0–15.0)
MCH: 26.8 pg (ref 26.0–34.0)
MCHC: 32.8 g/dL (ref 30.0–36.0)
MCV: 81.6 fL (ref 80.0–100.0)
Platelets: 181 10*3/uL (ref 150–400)
RBC: 3.21 MIL/uL — ABNORMAL LOW (ref 3.87–5.11)
RDW: 14.8 % (ref 11.5–15.5)
WBC: 21.7 10*3/uL — ABNORMAL HIGH (ref 4.0–10.5)
nRBC: 0.1 % (ref 0.0–0.2)

## 2021-05-08 LAB — TYPE AND SCREEN
ABO/RH(D): A POS
Antibody Screen: NEGATIVE
Unit division: 0

## 2021-05-08 MED ORDER — POTASSIUM CHLORIDE CRYS ER 20 MEQ PO TBCR
40.0000 meq | EXTENDED_RELEASE_TABLET | Freq: Every day | ORAL | Status: AC
  Administered 2021-05-08 – 2021-05-09 (×2): 40 meq via ORAL
  Filled 2021-05-08 (×2): qty 2

## 2021-05-08 MED ORDER — FUROSEMIDE 40 MG PO TABS
20.0000 mg | ORAL_TABLET | Freq: Every day | ORAL | Status: AC
  Administered 2021-05-08 – 2021-05-09 (×2): 20 mg via ORAL
  Filled 2021-05-08 (×2): qty 1

## 2021-05-08 NOTE — Lactation Note (Signed)
This note was copied from a baby's chart. Lactation Consultation Note  Patient Name: Bonnie Long DCVUD'T Date: 05/08/2021 Reason for consult: Follow-up assessment;Mother's request;Difficult latch;Early term 37-38.6wks;Infant weight loss;Other (Comment) Age:23 hours  Mom taught different positions to get a deeper latch. Mom to offer breast followed by supplementation with EBM first followed by [facility].   Mom not pumped today. LC reviewed the importance of pumping to maintain her milk supply. Mom need to pump after feed q 3hrs for 15 min. Pumping plan and milk storage on the board.   Mom working on increasing supplementation volume at each feeding with Dr. Manson Passey preemie nipple. RN working with Mom to increase volume with bottle feeding.   Mom to call for latch assistance with next feeding. Mom try laying in bed to get deeper latch with a laid back position.  All questions answered at the end of the visit.  RN to provide coconut oil for nipple care.   Maternal Data    Feeding Mother's Current Feeding Choice: Breast Milk and Donor Milk  LATCH Score Latch: Repeated attempts needed to sustain latch, nipple held in mouth throughout feeding, stimulation needed to elicit sucking reflex.  Audible Swallowing: A few with stimulation  Type of Nipple: Everted at rest and after stimulation (nipple little large infant struggling getting a deep enough latch even in different positions, compression stripe noted.)  Comfort (Breast/Nipple): Filling, red/small blisters or bruises, mild/mod discomfort (Mom some pain at start of the latch, once we make adjustements pain dimishes from 8 to 2 or 3. No abrasions or redness noted)  Hold (Positioning): Assistance needed to correctly position infant at breast and maintain latch.  LATCH Score: 6   Lactation Tools Discussed/Used Tools: Pump;Flanges Flange Size: 27 Breast pump type: Double-Electric Breast Pump Reason for Pumping: encourage Mom to start  pumping to maintain her milk supply Pumping frequency: every 3 hrs for 15 min  Interventions Interventions: Breast feeding basics reviewed;Breast compression;Assisted with latch;Adjust position;Skin to skin;Support pillows;Breast massage;Position options;DEBP;Hand express;Expressed milk;Education;Coconut oil  Discharge    Consult Status Consult Status: Follow-up Date: 05/09/21 Follow-up type: In-patient    Kamaljit Hizer  Nicholson-Springer 05/08/2021, 6:48 PM

## 2021-05-08 NOTE — Progress Notes (Signed)
Daily Antepartum Note  Admission Date: 05/03/2021 Current Date: 05/08/2021 9:46 AM  Bonnie Long is a 25 y.o. G1P1001 POD#2 s/p pLTCS @ 55w4dfor arrest of dilation, admitted for cHTN with severe pre-eclampsia (AKI).  Pregnancy complicated by: Patient Active Problem List   Diagnosis Date Noted   Chorioamnionitis 05/06/2021   Severe pre-eclampsia 05/06/2021   Encounter for supervision of high risk pregnancy in third trimester, antepartum 05/06/2021   Chronic hypertension with superimposed preeclampsia 05/03/2021   Polyhydramnios in third trimester 03/01/2021   Supervision of high-risk pregnancy 11/07/2020   Acute cyclitis 08/23/2020   Vitamin D deficiency 04/10/2020   Migraine 04/10/2020   Hypertension 02/11/2018   Obesity (BMI 30-39.9) 01/21/2018    Overnight events:  None   Subjective:  Patient denies any headaches, visual symptoms, RUQ/epigastric pain or other concerning symptoms. Pt feels well. No flatus yet  Objective:    Current Vital Signs 24h Vital Sign Ranges  T 98.6 F (37 C) Temp  Avg: 98.2 F (36.8 C)  Min: 97.6 F (36.4 C)  Max: 99.7 F (37.6 C)  BP 119/67  BP  Min: 102/44  Max: 124/61  HR (!) 101 Pulse  Avg: 87.8  Min: 76  Max: 101  RR 16 Resp  Avg: 17.2  Min: 16  Max: 18  SaO2 98 % Room Air SpO2  Avg: 97.9 %  Min: 94 %  Max: 100 %       24 Hour I/O Current Shift I/O  Time Ins Outs 08/08 0701 - 08/09 0700 In: 806.9 [I.V.:364.4] Out: 11610[Urine:1750] No intake/output data recorded.   Patient Vitals for the past 24 hrs:  BP Temp Temp src Pulse Resp SpO2  05/08/21 0722 119/67 98.6 F (37 C) Oral (!) 101 16 98 %  05/08/21 0423 116/60 99.7 F (37.6 C) -- 99 16 98 %  05/08/21 0026 124/61 97.9 F (36.6 C) Oral 84 16 98 %  05/07/21 2250 -- -- -- -- -- 100 %  05/07/21 2249 -- -- -- -- -- 100 %  05/07/21 2248 113/67 98 F (36.7 C) Oral 78 16 100 %  05/07/21 2000 103/61 98.2 F (36.8 C) Oral 84 17 100 %  05/07/21 1959 -- -- -- -- -- 97 %  05/07/21  1934 -- -- -- -- -- 100 %  05/07/21 1931 (!) 114/57 98.2 F (36.8 C) Oral 81 18 100 %  05/07/21 1740 (!) 120/48 98 F (36.7 C) Oral 90 18 96 %  05/07/21 1706 123/64 97.9 F (36.6 C) Oral 97 18 94 %  05/07/21 1110 (!) 102/44 97.6 F (36.4 C) Oral 76 18 95 %  05/07/21 1109 -- -- -- -- -- 95 %  05/07/21 1100 -- -- -- -- 18 --  05/07/21 1000 -- -- -- -- 18 --   UOP: 75-1041mhr  Physical exam: General: Well nourished, well developed female in no acute distress. Abdomen: obese, nttp, c/d/I honeycomb dressing Cardiovascular: S1, S2 normal, no murmur, rub or gallop, regular rate and rhythm Respiratory: CTAB Extremities: no clubbing, cyanosis, 2-3+ BLE edema, neg Homan's sign Skin: Warm and dry.    Medications: Current Facility-Administered Medications  Medication Dose Route Frequency Provider Last Rate Last Admin   acetaminophen (TYLENOL) tablet 1,000 mg  1,000 mg Oral Q6H PRN Suheyb Raucci, UgSallyanne HaversMD   1,000 mg at 05/08/21 0853   coconut oil  1 application Topical PRN DaRenard MatterMD       witch hazel-glycerin (TUCKS) pad 1 application  1  application Topical PRN Renard Matter, MD       And   dibucaine (NUPERCAINAL) 1 % rectal ointment 1 application  1 application Rectal PRN Renard Matter, MD       diphenhydrAMINE (BENADRYL) injection 12.5 mg  12.5 mg Intravenous Q4H PRN Lynda Rainwater, MD       Or   diphenhydrAMINE (BENADRYL) capsule 25 mg  25 mg Oral Q4H PRN Lynda Rainwater, MD   25 mg at 05/07/21 1636   diphenhydrAMINE (BENADRYL) capsule 25 mg  25 mg Oral Q6H PRN Renard Matter, MD       enoxaparin (LOVENOX) injection 60 mg  60 mg Subcutaneous Q24H Renard Matter, MD   60 mg at 05/07/21 6237   hydrALAZINE (APRESOLINE) injection 5 mg  5 mg Intravenous PRN Renard Matter, MD       And   hydrALAZINE (APRESOLINE) injection 10 mg  10 mg Intravenous PRN Renard Matter, MD       And   labetalol (NORMODYNE) injection 20 mg  20 mg Intravenous PRN Renard Matter, MD       And   labetalol (NORMODYNE) injection  40 mg  40 mg Intravenous PRN Renard Matter, MD       iron sucrose (VENOFER) 500 mg in sodium chloride 0.9 % 250 mL IVPB  500 mg Intravenous Once Brendon Christoffel A, MD       measles, mumps & rubella vaccine (MMR) injection 0.5 mL  0.5 mL Subcutaneous Once Renard Matter, MD       menthol-cetylpyridinium (CEPACOL) lozenge 3 mg  1 lozenge Oral Q2H PRN Renard Matter, MD       nalbuphine (NUBAIN) injection 5 mg  5 mg Intravenous Q4H PRN Lynda Rainwater, MD       Or   nalbuphine (NUBAIN) injection 5 mg  5 mg Subcutaneous Q4H PRN Lynda Rainwater, MD       nalbuphine (NUBAIN) injection 5 mg  5 mg Intravenous Once PRN Lynda Rainwater, MD       Or   nalbuphine (NUBAIN) injection 5 mg  5 mg Subcutaneous Once PRN Lynda Rainwater, MD       naloxone [facility]) injection 0.4 mg  0.4 mg Intravenous PRN Lynda Rainwater, MD       And   sodium chloride flush (NS) 0.9 % injection 3 mL  3 mL Intravenous PRN Lynda Rainwater, MD       naloxone HCl [facility]) 2 mg in dextrose 5 % 250 mL infusion  1-4 mcg/kg/hr Intravenous Continuous PRN Lynda Rainwater, MD       oxyCODONE (Oxy IR/ROXICODONE) immediate release tablet 5-10 mg  5-10 mg Oral Q4H PRN Monigue Spraggins, Sallyanne Havers, MD   5 mg at 05/08/21 0606   prenatal multivitamin tablet 1 tablet  1 tablet Oral Q1200 Renard Matter, MD   1 tablet at 05/07/21 1237   senna-docusate (Senokot-S) tablet 2 tablet  2 tablet Oral Daily Renard Matter, MD   2 tablet at 05/07/21 0951   simethicone (MYLICON) chewable tablet 80 mg  80 mg Oral TID Neila Gear, MD   80 mg at 05/08/21 6283   simethicone (MYLICON) chewable tablet 80 mg  80 mg Oral PRN Renard Matter, MD       Tdap Durwin Reges) injection 0.5 mL  0.5 mL Intramuscular Once Renard Matter, MD        Labs:  Recent Labs  Lab 05/06/21 2357 05/07/21 1038 05/08/21 0445  WBC 29.1* 27.6*  21.7*  HGB 7.9* 7.5* 8.6*  HCT 24.6* 23.4* 26.2*  PLT 157 163 181     Recent Labs  Lab 05/06/21 2357 05/07/21 1038 05/08/21 0445  NA 134* 134* 134*   K 4.4 4.3 3.8  CL 105 104 104  CO2 _0 BUN 14 17 21*  CREATININE 1.71* 1.57* 1.38*  CALCIUM 8.1* 8.0* 8.1*  PROT 5.0* 5.2* 5.0*  BILITOT 1.9* 1.1 1.0  ALKPHOS 94 93 89  ALT _1 AST _2 GLUCOSE 122* 106* 87    Radiology:  No new imaging  Assessment & Plan:  Pt improving *CV: Stable BP. Lasix given for edema. *Anemia: stable s/p transfusion of 2 pRBCs. Venofer today. *PPx: SCDs, OOB. On lovenox.  *FEN/GI: regular diet *PP: breast. A pos. Desires circ for female infant  will do as per Peds. S/p  24hrs of abx due to chorio and c/s. *Dispo: likely pod#3  Verita Schneiders, MD Attending Center for [facility]) GYN Consult Phone: [phone] (M-F, [phone]) & [phone]  (Off hours, weekends, holidays)

## 2021-05-09 ENCOUNTER — Other Ambulatory Visit (HOSPITAL_COMMUNITY): Payer: Self-pay

## 2021-05-09 DIAGNOSIS — D62 Acute posthemorrhagic anemia: Secondary | ICD-10-CM | POA: Diagnosis not present

## 2021-05-09 LAB — COMPREHENSIVE METABOLIC PANEL
ALT: 18 U/L (ref 0–44)
AST: 27 U/L (ref 15–41)
Albumin: 2.2 g/dL — ABNORMAL LOW (ref 3.5–5.0)
Alkaline Phosphatase: 85 U/L (ref 38–126)
Anion gap: 8 (ref 5–15)
BUN: 17 mg/dL (ref 6–20)
CO2: 26 mmol/L (ref 22–32)
Calcium: 8.4 mg/dL — ABNORMAL LOW (ref 8.9–10.3)
Chloride: 100 mmol/L (ref 98–111)
Creatinine, Ser: 1.41 mg/dL — ABNORMAL HIGH (ref 0.44–1.00)
GFR, Estimated: 54 mL/min — ABNORMAL LOW (ref >60)
Glucose, Bld: 78 mg/dL (ref 70–99)
Potassium: 4 mmol/L (ref 3.5–5.1)
Sodium: 134 mmol/L — ABNORMAL LOW (ref 135–145)
Total Bilirubin: 1 mg/dL (ref 0.3–1.2)
Total Protein: 5.1 g/dL — ABNORMAL LOW (ref 6.5–8.1)

## 2021-05-09 LAB — SURGICAL PATHOLOGY

## 2021-05-09 MED ORDER — ACETAMINOPHEN 500 MG PO TABS
1000.0000 mg | ORAL_TABLET | Freq: Four times a day (QID) | ORAL | 0 refills | Status: DC | PRN
  Filled 2021-05-09: qty 60, 8d supply, fill #0

## 2021-05-09 MED ORDER — SENNOSIDES-DOCUSATE SODIUM 8.6-50 MG PO TABS
2.0000 | ORAL_TABLET | Freq: Every evening | ORAL | 2 refills | Status: DC | PRN
  Filled 2021-05-09: qty 30, 15d supply, fill #0

## 2021-05-09 MED ORDER — NIFEDIPINE ER OSMOTIC RELEASE 30 MG PO TB24
30.0000 mg | ORAL_TABLET | Freq: Once | ORAL | Status: AC
  Administered 2021-05-09: 30 mg via ORAL
  Filled 2021-05-09: qty 1

## 2021-05-09 MED ORDER — OXYCODONE HCL 5 MG PO TABS
5.0000 mg | ORAL_TABLET | ORAL | 0 refills | Status: DC | PRN
  Filled 2021-05-09: qty 30, 3d supply, fill #0

## 2021-05-09 MED ORDER — FERROUS SULFATE 325 (65 FE) MG PO TABS
325.0000 mg | ORAL_TABLET | ORAL | 3 refills | Status: DC
  Filled 2021-05-09: qty 30, 60d supply, fill #0

## 2021-05-09 MED ORDER — NIFEDIPINE ER 30 MG PO TB24
30.0000 mg | ORAL_TABLET | Freq: Every day | ORAL | 1 refills | Status: DC
  Filled 2021-05-09 – 2022-02-21 (×2): qty 30, 30d supply, fill #0

## 2021-05-09 NOTE — Progress Notes (Signed)
Daily Postpartum Note  Admission Date: 05/03/2021 Current Date: 05/09/2021 1:08 PM  Bonnie Long is a 25 y.o. G1P1001 POD#3 s/p pLTCS @ 58w4dfor arrest of dilation, admitted for cHTN with severe pre-eclampsia (AKI).  Pregnancy complicated by: Patient Active Problem List   Diagnosis Date Noted   Chorioamnionitis 05/06/2021   Severe pre-eclampsia 05/06/2021   Encounter for supervision of high risk pregnancy in third trimester, antepartum 05/06/2021   Chronic hypertension with superimposed preeclampsia 05/03/2021   Polyhydramnios in third trimester 03/01/2021   Supervision of high-risk pregnancy 11/07/2020   Acute cyclitis 08/23/2020   Vitamin D deficiency 04/10/2020   Migraine 04/10/2020   Hypertension 02/11/2018   Obesity (BMI 30-39.9) 01/21/2018    Overnight events:  None   Subjective:  Patient denies any headaches, visual symptoms, RUQ/epigastric pain or other concerning symptoms. Pt feels well. Reports flatus.  Objective:    Current Vital Signs 24h Vital Sign Ranges  T 98.1 F (36.7 C) Temp  Avg: 98.6 F (37 C)  Min: 98.1 F (36.7 C)  Max: 99.7 F (37.6 C)  BP (!) 152/93  BP  Min: 128/72  Max: 152/93  HR 82 Pulse  Avg: 92  Min: 82  Max: 103  RR 19 Resp  Avg: 18.2  Min: 16  Max: 19  SaO2 99 %  (Room Air) SpO2  Avg: 98.8 %  Min: 98 %  Max: 100 %       24 Hour I/O Current Shift I/O  Time Ins Outs 08/09 0701 - 08/10 0700 In: 831 [P.O.:831] Out: 2350 [Urine:2350] No intake/output data recorded.   Patient Vitals for the past 24 hrs:  BP Temp Temp src Pulse Resp SpO2  05/09/21 1127 (!) 152/93 98.1 F (36.7 C) Oral 82 19 99 %  05/09/21 0903 (!) 144/79 98.2 F (36.8 C) Oral 87 19 99 %  05/09/21 0417 134/82 98.3 F (36.8 C) Oral 87 19 99 %  05/08/21 2350 (!) 142/80 98.4 F (36.9 C) Oral 100 18 98 %  05/08/21 1929 128/72 99.7 F (37.6 C) Oral (!) 103 18 98 %  05/08/21 1550 128/75 99 F (37.2 C) Oral 93 16 100 %   UOP: 75-1038mhr  Physical exam: General:  Well nourished, well developed female in no acute distress. Abdomen: obese, nttp, c/d/I honeycomb dressing Cardiovascular: S1, S2 normal, no murmur, rub or gallop, regular rate and rhythm Respiratory: CTAB Extremities: no clubbing, cyanosis, 2-3+ BLE edema, neg Homan's sign Skin: Warm and dry.    Medications: Current Facility-Administered Medications  Medication Dose Route Frequency Provider Last Rate Last Admin   acetaminophen (TYLENOL) tablet 1,000 mg  1,000 mg Oral Q6H PRN Tyniesha Howald A, MD   1,000 mg at 05/08/21 0853   coconut oil  1 application Topical PRN DaRenard MatterMD   1 application at 0846/27/03917   witch hazel-glycerin (TUCKS) pad 1 application  1 application Topical PRN DaRenard MatterMD       And   dibucaine (NUPERCAINAL) 1 % rectal ointment 1 application  1 application Rectal PRN DaRenard MatterMD       diphenhydrAMINE (BENADRYL) injection 12.5 mg  12.5 mg Intravenous Q4H PRN MiLynda RainwaterMD       Or   diphenhydrAMINE (BENADRYL) capsule 25 mg  25 mg Oral Q4H PRN MiLynda RainwaterMD   25 mg at 05/07/21 1636   diphenhydrAMINE (BENADRYL) capsule 25 mg  25 mg Oral Q6H PRN DaRenard MatterMD  enoxaparin (LOVENOX) injection 60 mg  60 mg Subcutaneous Q24H Renard Matter, MD   60 mg at 05/09/21 3536   hydrALAZINE (APRESOLINE) injection 5 mg  5 mg Intravenous PRN Renard Matter, MD       And   hydrALAZINE (APRESOLINE) injection 10 mg  10 mg Intravenous PRN Renard Matter, MD       And   labetalol (NORMODYNE) injection 20 mg  20 mg Intravenous PRN Renard Matter, MD       And   labetalol (NORMODYNE) injection 40 mg  40 mg Intravenous PRN Renard Matter, MD       measles, mumps & rubella vaccine (MMR) injection 0.5 mL  0.5 mL Subcutaneous Once Renard Matter, MD       menthol-cetylpyridinium (CEPACOL) lozenge 3 mg  1 lozenge Oral Q2H PRN Renard Matter, MD       nalbuphine (NUBAIN) injection 5 mg  5 mg Intravenous Q4H PRN Lynda Rainwater, MD       Or   nalbuphine (NUBAIN) injection 5 mg  5 mg  Subcutaneous Q4H PRN Lynda Rainwater, MD       nalbuphine (NUBAIN) injection 5 mg  5 mg Intravenous Once PRN Lynda Rainwater, MD       Or   nalbuphine (NUBAIN) injection 5 mg  5 mg Subcutaneous Once PRN Lynda Rainwater, MD       naloxone [facility]) injection 0.4 mg  0.4 mg Intravenous PRN Lynda Rainwater, MD       And   sodium chloride flush (NS) 0.9 % injection 3 mL  3 mL Intravenous PRN Lynda Rainwater, MD       naloxone HCl [facility]) 2 mg in dextrose 5 % 250 mL infusion  1-4 mcg/kg/hr Intravenous Continuous PRN Lynda Rainwater, MD       oxyCODONE (Oxy IR/ROXICODONE) immediate release tablet 5-10 mg  5-10 mg Oral Q4H PRN Lenoir Facchini, Sallyanne Havers, MD   10 mg at 05/09/21 1443   prenatal multivitamin tablet 1 tablet  1 tablet Oral Q1200 Renard Matter, MD   1 tablet at 05/08/21 1309   senna-docusate (Senokot-S) tablet 2 tablet  2 tablet Oral Daily Renard Matter, MD   2 tablet at 05/09/21 0925   simethicone (MYLICON) chewable tablet 80 mg  80 mg Oral TID PC Renard Matter, MD   80 mg at 05/09/21 1540   simethicone (MYLICON) chewable tablet 80 mg  80 mg Oral PRN Renard Matter, MD       Tdap Durwin Reges) injection 0.5 mL  0.5 mL Intramuscular Once Renard Matter, MD        Labs:  Recent Labs  Lab 05/06/21 2357 05/07/21 1038 05/08/21 0445  WBC 29.1* 27.6* 21.7*  HGB 7.9* 7.5* 8.6*  HCT 24.6* 23.4* 26.2*  PLT 157 163 181     Recent Labs  Lab 05/07/21 1038 05/08/21 0445 05/09/21 0418  NA 134* 134* 134*  K 4.3 3.8 4.0  CL 104 104 100  CO2 '23 24 26  ' BUN 17 21* 17  CREATININE 1.57* 1.38* 1.41*  CALCIUM 8.0* 8.1* 8.4*  PROT 5.2* 5.0* 5.1*  BILITOT 1.1 1.0 1.0  ALKPHOS 93 89 85  ALT '13 12 18  ' AST '25 24 27  ' GLUCOSE 106* 87 78    Radiology:  No new imaging  Assessment & Plan:  Pt improving *CV: Procardia XL 30 qd started. Will monitor BP. If stable, possible discharge later today or tomorrow *Anemia: stable s/p transfusion of 2  pRBCs and Venofer. *PPx: SCDs, OOB. On lovenox.  *FEN/GI:  regular diet *PP: breast. A pos.. S/p  24hrs of abx due to chorio and c/s. Patient desires circumcision for her female infant.  Circumcision procedure details discussed, risks and benefits of procedure were also discussed.  These include but are not limited to: Benefits of circumcision in men include reduction in the rates of urinary tract infection (UTI), penile cancer, some sexually transmitted infections, penile inflammatory and retractile disorders, as well as easier hygiene.  Risks include bleeding , infection, injury of glans which may lead to penile deformity or urinary tract issues, unsatisfactory cosmetic appearance and other potential complications related to the procedure.  It was emphasized that this is an elective procedure.  Patient wants to proceed with circumcision; written informed consent obtained.  Will do circumcision soon, routine circumcision and post circumcision care ordered for the infant. *Dispo: likely today or tomorrow depending on BP control  Verita Schneiders, MD Attending Center for [facility]) GYN Consult Phone: [phone] (M-F, 0800-1700) & [phone]  (Off hours, weekends, holidays)

## 2021-05-09 NOTE — Plan of Care (Signed)
  Problem: Education: Goal: Individualized Educational Video(s) Outcome: Adequate for Discharge Goal: Individualized Newborn Educational Video(s) Outcome: Adequate for Discharge   Problem: Activity: Goal: Will verbalize the importance of balancing activity with adequate rest periods Outcome: Adequate for Discharge Goal: Ability to tolerate increased activity will improve Outcome: Adequate for Discharge   Problem: Coping: Goal: Ability to identify and utilize available resources and services will improve Outcome: Adequate for Discharge   Problem: Life Cycle: Goal: Chance of risk for complications during the postpartum period will decrease Outcome: Adequate for Discharge   Problem: Role Relationship: Goal: Ability to demonstrate positive interaction with newborn will improve Outcome: Adequate for Discharge   Problem: Skin Integrity: Goal: Demonstration of wound healing without infection will improve Outcome: Adequate for Discharge

## 2021-05-09 NOTE — Lactation Note (Signed)
This note was copied from a baby's chart. Lactation Consultation Note  Patient Name: Bonnie Long [facility] Date: 05/09/2021 Reason for consult: Follow-up assessment;Primapara;1st time breastfeeding;Early term 37-38.6wks Age:23 hours  LC in to room prior to discharge. "[name]" is being taken to circumcision. Mother states her milk is in, [facility] noted no engorgement/firmness/fullness. Mother reports pumping once yesterday and collected ~5 mL. Mother started pumping during visit, unable to see any breast milk after ~18 minutes of pumping with maintenance mode. "[name]" has only latch once yesterday, mother explains he has a good latch.  Mother plans to pump at home and bottlefeed. Provided handout with appropriate feeding volume according to age. Discussed normal infant behavior after circumcision, normal output as signs good intake. Urged mother to pump every 3h.   Feeding plan:  1-Feeding on demand or 8-12 times in 24h period. 2-Pump or hand-express every 3h 3-Encouraged maternal rest, hydration and food intake.    All questions answered at this time. Reviewed LC brochure and [employer] booklet. Contact LC as needed for feeds/support/concerns/questions.   Maternal Data Has patient been taught Hand Expression?: Yes  Feeding Mother's Current Feeding Choice: Breast Milk and Donor Milk  Lactation Tools Discussed/Used Tools: Pump;Flanges Flange Size: 27 Breast pump type: Double-Electric Breast Pump Pump Education: Setup, frequency, and cleaning;Milk Storage Reason for Pumping: supplementation Pumping frequency: reinforced every 3-hours.  Interventions Interventions: DEBP;Breast massage;Hand express;Skin to skin;Breast feeding basics reviewed;Education;Expressed milk;Ice  Discharge Discharge Education: Engorgement and breast care;Warning signs for feeding baby Pump: Personal WIC Program: No  Consult Status Consult Status: Complete Date: 05/09/21 Follow-up type: Call as  needed    Bonnie Long 05/09/2021, 11:59 AM

## 2021-05-10 ENCOUNTER — Telehealth (INDEPENDENT_AMBULATORY_CARE_PROVIDER_SITE_OTHER): Payer: 59 | Admitting: *Deleted

## 2021-05-10 ENCOUNTER — Other Ambulatory Visit: Payer: 59

## 2021-05-10 ENCOUNTER — Encounter: Payer: 59 | Admitting: Obstetrics & Gynecology

## 2021-05-10 ENCOUNTER — Other Ambulatory Visit: Payer: Self-pay

## 2021-05-10 DIAGNOSIS — Z3482 Encounter for supervision of other normal pregnancy, second trimester: Secondary | ICD-10-CM | POA: Diagnosis not present

## 2021-05-10 DIAGNOSIS — Z3483 Encounter for supervision of other normal pregnancy, third trimester: Secondary | ICD-10-CM | POA: Diagnosis not present

## 2021-05-10 DIAGNOSIS — Z0130 Encounter for examination of blood pressure without abnormal findings: Secondary | ICD-10-CM

## 2021-05-10 DIAGNOSIS — O119 Pre-existing hypertension with pre-eclampsia, unspecified trimester: Secondary | ICD-10-CM

## 2021-05-10 NOTE — Progress Notes (Addendum)
   NURSE VISIT- BLOOD PRESSURE CHECK  I connected withNAME@ on 05/10/2021 by telephone  and verified that I am speaking with the correct person using two identifiers.   I discussed the limitations of evaluation and management by telemedicine. The patient expressed understanding and agreed to proceed.  Nurse is at the office, and patient is at home.  SUBJECTIVE:  Bonnie Long is a 25 y.o. G75P1001 female here for BP check. She is postpartum, delivery date 05/06/21.     HYPERTENSION ROS:  Postpartum:  Severe headaches that don't go away with tylenol/other medicines: No  Visual changes (seeing spots/double/blurred vision) No  Severe pain under right breast breast or in center of upper chest No  Severe nausea/vomiting No  Taking medicines as instructed yes  OBJECTIVE:  BP 134/86 (BP Location: Left Arm, Patient Position: Sitting)   Breastfeeding Yes   Appearance alert, well appearing, and in no distress and oriented to person, place, and time.  ASSESSMENT: Postpartum  blood pressure check  PLAN: Discussed with Dr. Despina Hidden   Recommendations: no changes needed   Follow-up: as scheduled   Jobe Marker  05/10/2021 12:12 PM   Attestation of Attending Supervision of Nursing Visit Encounter: Evaluation and management procedures were performed by the nursing staff under my supervision and collaboration.  I have reviewed the nurse's note and chart, and I agree with the management and plan.  Rockne Coons MD Attending Physician for the Center for [facility] 05/10/2021 5:01 PM

## 2021-05-14 IMAGING — DX CHEST - 2 VIEW
2 series · 2 of 2 positions shown · non-contrast
Comparison: None.

CLINICAL DATA: Pre-employment physical examination

EXAM:
CHEST - 2 VIEW

[chest pa]
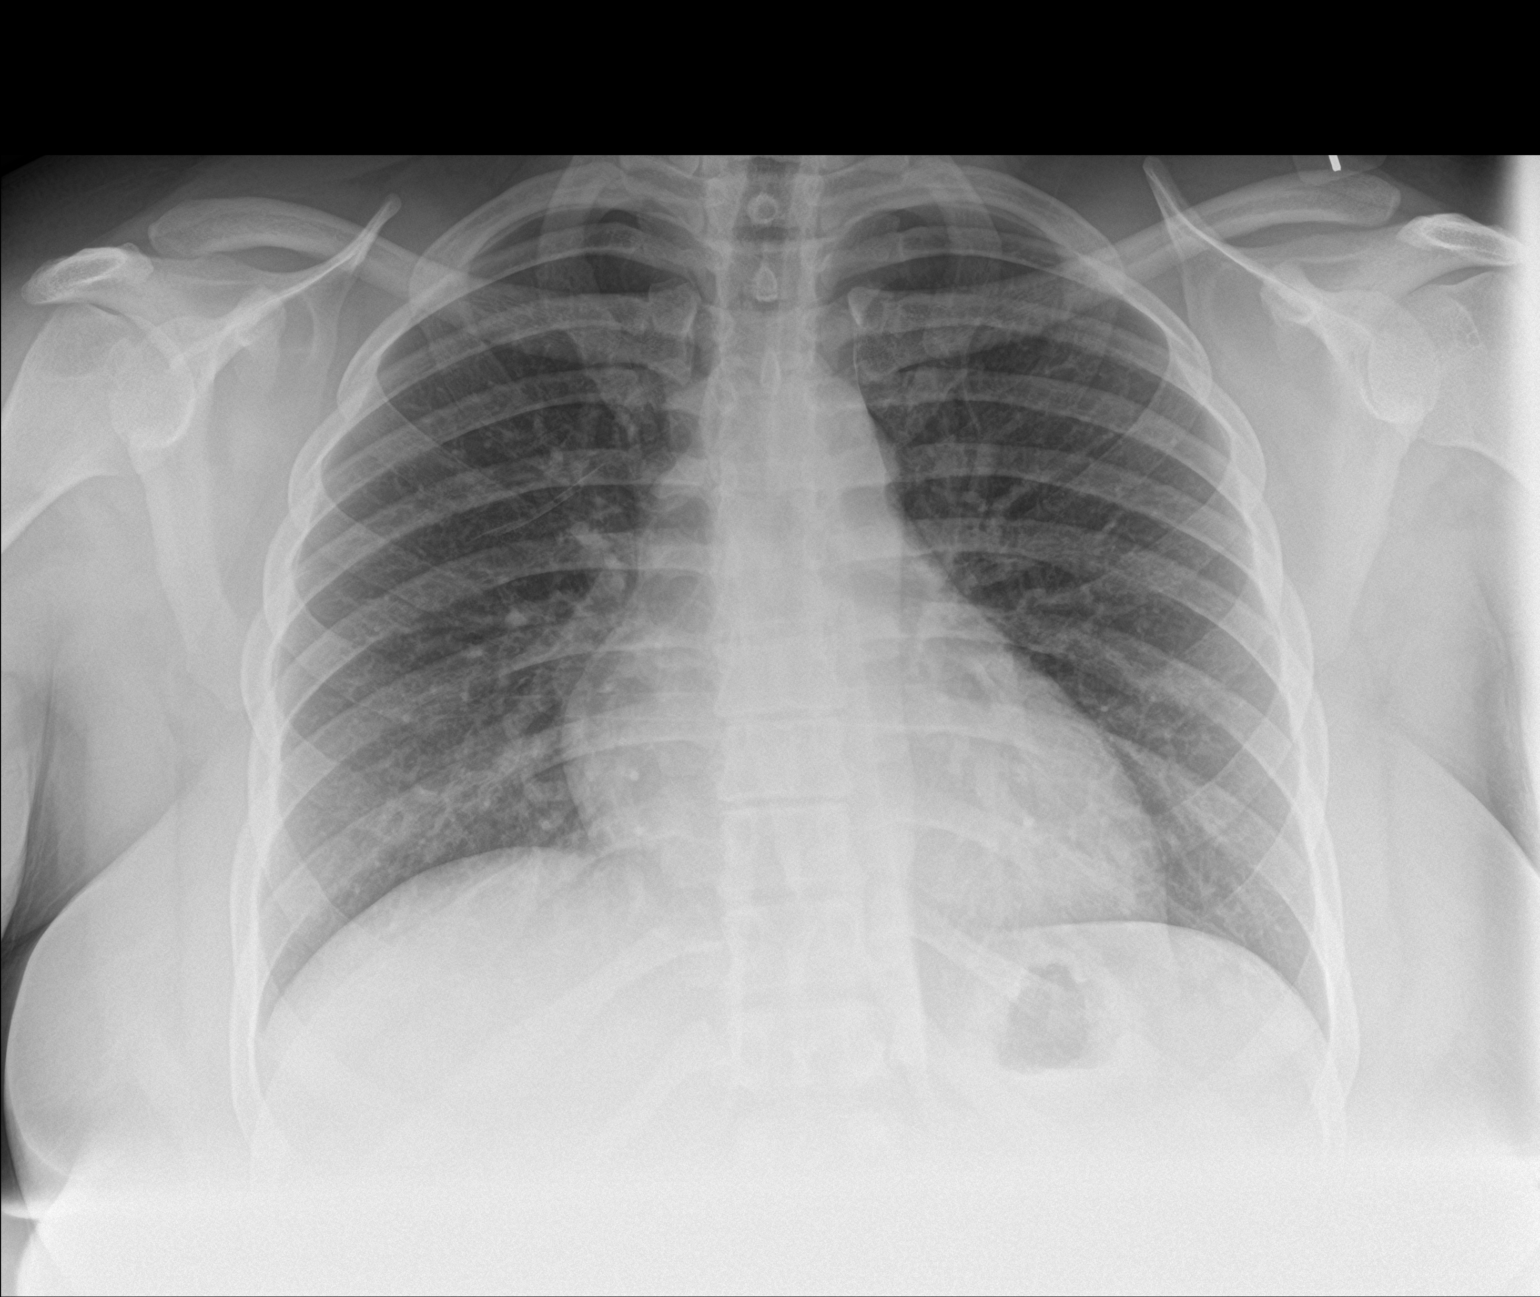

[chest lat]
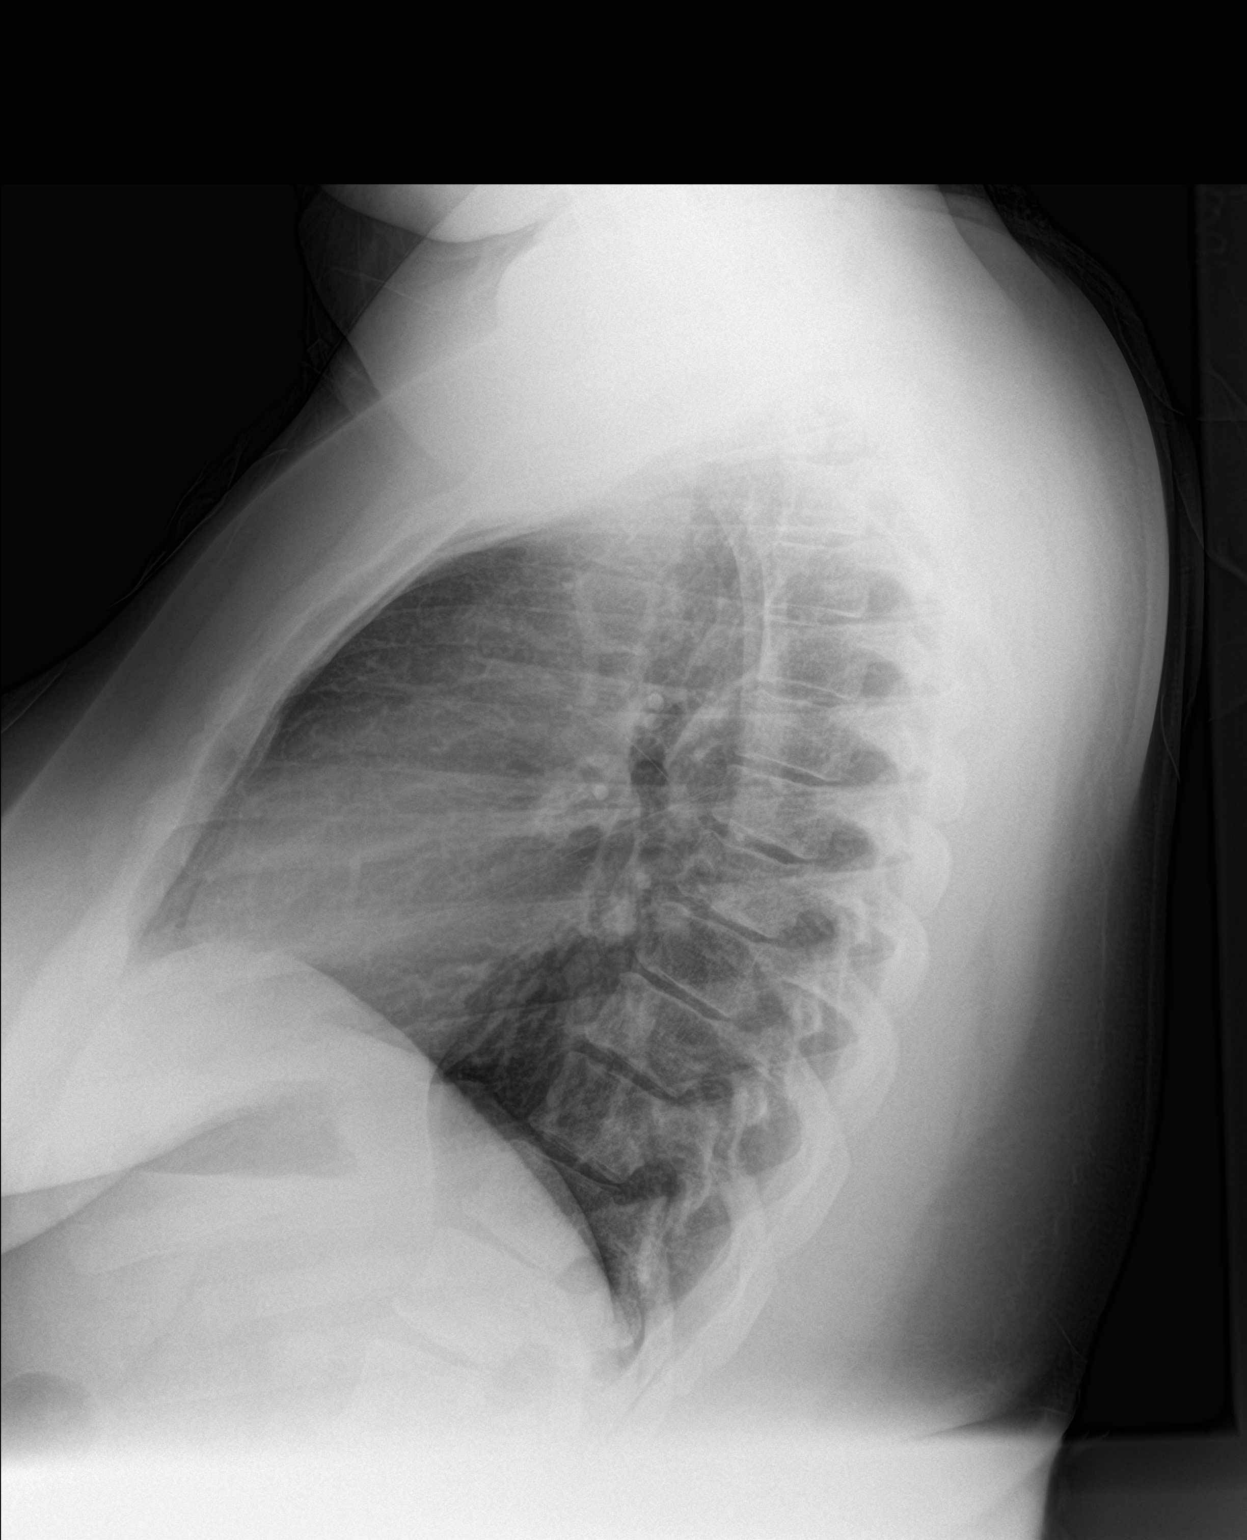

[2 of 2 positions shown; findings below may reference images not displayed]

FINDINGS: Lungs are clear. Heart size and pulmonary vascularity are normal. No
adenopathy. No bone lesions.
IMPRESSION: No abnormality noted.

## 2021-05-17 ENCOUNTER — Ambulatory Visit (INDEPENDENT_AMBULATORY_CARE_PROVIDER_SITE_OTHER): Payer: 59 | Admitting: Obstetrics & Gynecology

## 2021-05-17 ENCOUNTER — Other Ambulatory Visit: Payer: Self-pay

## 2021-05-17 ENCOUNTER — Encounter: Payer: Self-pay | Admitting: Obstetrics & Gynecology

## 2021-05-17 VITALS — BP 142/102 | HR 74 | Ht 63.0 in | Wt 234.2 lb

## 2021-05-17 DIAGNOSIS — Z4889 Encounter for other specified surgical aftercare: Secondary | ICD-10-CM

## 2021-05-17 DIAGNOSIS — O119 Pre-existing hypertension with pre-eclampsia, unspecified trimester: Secondary | ICD-10-CM

## 2021-05-17 NOTE — Progress Notes (Signed)
    Post Partum Visit Note  Bonnie Long is a 25 y.o. G67P1001 female who presents for a postoperative visit. She is 1 week postop following a C-section due to  arrest of dilation completed on 05/06/21- complicated by uterine atony, cHTN with superimposed preeclampsia.   Today she notes that she is doing well.  No headache, no blurry vision.  Taking BP medication.  At home, BPs mostly 130/80s. Denies fever or chills.  Tolerating gen diet.  +Flatus, Regular BMs.  Pain is well controlled. Overall doing well and reports no acute complaints   The pregnancy intention screening data noted above was reviewed. Potential methods of contraception were discussed. The patient elected to proceed with deciding between either Nexplanon or POPs  Review of Systems Pertinent items are noted in HPI.    Objective:  BP (!) 142/102 (BP Location: Left Arm, Patient Position: Sitting, Cuff Size: Normal)   Pulse 74   Ht 5\' 3"  (1.6 m)   Wt 234 lb 3.2 oz (106.2 kg)   Breastfeeding Yes   BMI 41.49 kg/m    Physical Examination:  GENERAL ASSESSMENT: well developed and well nourished SKIN: warm and dry CHEST: normal air exchange, respiratory effort normal with no retractions HEART: regular rate and rhythm ABDOMEN: soft, non-distended INCISION: C/D/I EXTREMITY: no calf tenderness, no edema bilaterally PSYCH: mood appropriate, normal affect       Assessment:    Postop visit   Plan:   -Meeting postop milestones appropriately, incision well healed -cHTN with superimposed preeclampsia  Continue ProcardiaXL 30mg , f/u in 1wk for BP check  Currently asymptomatic  , DO Attending Obstetrician & Gynecologist, Faculty Practice Center for , [facility]

## 2021-05-18 ENCOUNTER — Telehealth (HOSPITAL_COMMUNITY): Payer: Self-pay | Admitting: *Deleted

## 2021-05-18 NOTE — Telephone Encounter (Signed)
Mom reports feeling good. Incision is healing well. OB appt yesterday with good report. No concerns about herself. EPDS = 2 (Hospital score =2) Mom reports baby is well. Feeding, peeing, and pooping without difficulty. No concerns about baby.  Duffy Rhody, RN 05-18-2021 at 10:35am

## 2021-05-23 ENCOUNTER — Ambulatory Visit (INDEPENDENT_AMBULATORY_CARE_PROVIDER_SITE_OTHER): Payer: 59 | Admitting: *Deleted

## 2021-05-23 ENCOUNTER — Other Ambulatory Visit: Payer: Self-pay

## 2021-05-23 VITALS — BP 121/85 | HR 87

## 2021-05-23 DIAGNOSIS — Z0130 Encounter for examination of blood pressure without abnormal findings: Secondary | ICD-10-CM

## 2021-05-23 DIAGNOSIS — O119 Pre-existing hypertension with pre-eclampsia, unspecified trimester: Secondary | ICD-10-CM

## 2021-05-23 NOTE — Progress Notes (Addendum)
   NURSE VISIT- BLOOD PRESSURE CHECK  SUBJECTIVE:  Bonnie Long is a 25 y.o. G49P1001 female here for BP check. She is postpartum, delivery date 05/06/21     HYPERTENSION ROS:  Postpartum:  Severe headaches that don't go away with tylenol/other medicines: No  Visual changes (seeing spots/double/blurred vision) No  Severe pain under right breast breast or in center of upper chest No  Severe nausea/vomiting No  Taking medicines as instructed yes   OBJECTIVE:  BP 121/85 (BP Location: Left Arm, Patient Position: Sitting, Cuff Size: Large)   Pulse 87   Appearance alert, well appearing, and in no distress and oriented to person, place, and time.  ASSESSMENT: Postpartum  blood pressure check  PLAN: Discussed with Cathie Beams, CNM   Recommendations: no changes needed   Follow-up: as scheduled   Jobe Marker  05/23/2021 2:56 PM Chart reviewed for nurse visit. Agree with plan of care.  Jacklyn Shell, [place] 05/23/2021 3:56 PM

## 2021-06-12 ENCOUNTER — Ambulatory Visit: Payer: 59 | Admitting: Women's Health

## 2021-06-21 ENCOUNTER — Encounter: Payer: Self-pay | Admitting: Advanced Practice Midwife

## 2021-06-21 ENCOUNTER — Ambulatory Visit (INDEPENDENT_AMBULATORY_CARE_PROVIDER_SITE_OTHER): Payer: 59 | Admitting: Advanced Practice Midwife

## 2021-06-21 ENCOUNTER — Other Ambulatory Visit: Payer: Self-pay

## 2021-06-21 NOTE — Progress Notes (Signed)
Post Partum Visit Note   Chief Complaint:   Postpartum Care (Talk birth control)  History of Present Illness:   Bonnie Long is a 25 y.o. [id number] African American female being seen today for a postpartum visit. She is 6 weeks postpartum following a primary cesarean section, low transverse incision at 37.3 gestational weeks. IOL: Yes, for  [facility] w/SIPE . Anesthesia: epidural.  Laceration: n/a.  Complications: arrest of dilation _0 -7cms Pregnancy complicated by Severe Preeclampsia . Tobacco use: no. Substance use disorder: no. Last pap smear: 12-06-20 and results were NILM w/ HRHPV negative. Next pap smear due: 2025 No LMP recorded.  Postpartum course has been uncomplicated. Bleeding no bleeding. Bowel function is normal. Bladder function is normal. Urinary incontinence? No, fecal incontinence? No Patient is not sexually active. Last sexual activity: prior to birth.    Upstream - 06/21/21 1432       Pregnancy Intention Screening   Does the patient want to become pregnant in the next year? Yes    Does the patient's partner want to become pregnant in the next year? Yes    Would the patient like to discuss contraceptive options today? Yes      Contraception Wrap Up   Current Method Abstinence    End Method Abstinence    Contraception Counseling Provided Yes            The pregnancy intention screening data noted above was reviewed. Potential methods of contraception were discussed. The patient elected to proceed with Abstinence.  Edinburgh Postpartum Depression Screening: Negative  Edinburgh Postnatal Depression Scale - 06/21/21 1434       Edinburgh Postnatal Depression Scale:  In the Past 7 Days   I have been able to laugh and see the funny side of things. 0    I have looked forward with enjoyment to things. 0    I have blamed myself unnecessarily when things went wrong. 0    I have been anxious or worried for no good reason. 1    I have felt scared or panicky for no good  reason. 1    Things have been getting on top of me. 1    I have been so unhappy that I have had difficulty sleeping. 0    I have felt sad or miserable. 0    I have been so unhappy that I have been crying. 0    The thought of harming myself has occurred to me. 0    Edinburgh Postnatal Depression Scale Total 3            Baby's course has been uncomplicated. Baby is feeding by breast and bottle: milk supply adequate. Infant has a pediatrician/family doctor? Yes.  Childcare strategy if returning to work/school: yes.  Pt has material needs met for her and baby: Yes.   Review of Systems:   Pertinent items are noted in HPI Denies Abnormal vaginal discharge w/ itching/odor/irritation, headaches, visual changes, shortness of breath, chest pain, abdominal pain, severe nausea/vomiting, or problems with urination or bowel movements. Pertinent History Reviewed:  Reviewed past medical,surgical, obstetrical and family history.  Reviewed problem list, medications and allergies. OB History  Gravida Para Term Preterm AB Living  _1 0 0 1  SAB IAB Ectopic Multiple Live Births  0 0 0 0 1    # Outcome Date GA Lbr Len/2nd Weight Sex Delivery Anes PTL Lv  1 Term 05/06/21 [redacted]w[redacted]d 6 lb 7.2 oz (2.925 kg) M CS-LTranv EPI  LIV  Physical Assessment:   Vitals:   06/21/21 1427  BP: (!) 129/91  Pulse: 70  Weight: 238 lb (108 kg)  Height: _0  (1.6 m)  Body mass index is 42.16 kg/m.  Objective:  Blood pressure (!) 129/91, pulse 70, height _1  (1.6 m), weight 238 lb (108 kg), currently breastfeeding.  General:  alert, cooperative, and no distress   Breasts:  negative  Lungs: Normal respiratory effort  Heart:  regular rate and rhythm  Abdomen: soft, non-tender, incision well healed   Vulva:  not evaluated  Vagina: not evaluated  Cervix:  normal  Corpus: Well involuted  Adnexa:  not evaluated  Rectal Exam: no hemorrhoids          No results found for this or any previous visit (from the  past 24 hour(s)).  Assessment & Plan:  1) Postpartum exam 2) 6 wks s/p primary cesarean section, low transverse incision 3) breast & bottle feeding 4) Depression screening 5) Contraception counseling  Essential components of care per ACOG recommendations:  1.  Mood and well being:  If positive depression screen, discussed and plan developed.  If using tobacco we discussed reduction/cessation and risk of relapse If current substance abuse, we discussed and referral to local resources was offered.   2. Infant care and feeding:  If breastfeeding, discussed returning to work, pumping, breastfeeding-associated pain, guidance regarding return to fertility while lactating if not using another method. If needed, patient was provided with a letter to be allowed to pump q 2-3hrs to support lactation in a private location with access to a refrigerator to store breastmilk.   Recommended that all caregivers be immunized for flu, pertussis and other preventable communicable diseases If pt does not have material needs met for her/baby, referred to local resources for help obtaining these.  3. Sexuality, contraception and birth spacing Provided guidance regarding sexuality, management of dyspareunia, and resumption of intercourse.  Will probably get Nexplanon, but declined today Discussed avoiding interpregnancy interval <60mhs and recommended birth spacing of 18 months  4. Sleep and fatigue Discussed coping options for fatigue and sleep disruption Encouraged family/partner/community support of 4 hrs of uninterrupted sleep to help with mood and fatigue  5. Physical recovery  If pt had a C/S, assessed incisional pain and providing guidance on normal vs prolonged recovery If pt had a laceration, perineal healing and pain reviewed.  If urinary or fecal incontinence, discussed management and referred to PT or uro/gyn if indicated  Patient is safe to resume physical activity. Discussed attainment of  healthy weight.  6.  Chronic disease management Discussed pregnancy complications if any, and their implications for future childbearing and long-term maternal health. Review recommendations for prevention of recurrent pregnancy complications, such as 17 hydroxyprogesterone caproate to reduce risk for recurrent PTB no, or aspirin to reduce risk of preeclampsia yes. Pt had GDM: No. If yes, 2hr GTT scheduled: not applicable. Reviewed medications and non-pregnant dosing including consideration of whether pt is breastfeeding using a reliable resource such as LactMed: yes Referred for f/u w/ PCP or subspecialist providers as indicated: not applicable  7. Health maintenance Mammogram at [phone] earlier if indicated Pap smears as indicated  Meds: No orders of the defined types were placed in this encounter.   Follow-up: No follow-ups on file.   No orders of the defined types were placed in this encounter.      [employer] 06/21/2021 3:27 PM

## 2021-08-03 DIAGNOSIS — I10 Essential (primary) hypertension: Secondary | ICD-10-CM | POA: Diagnosis not present

## 2021-08-03 DIAGNOSIS — N3000 Acute cystitis without hematuria: Secondary | ICD-10-CM | POA: Diagnosis not present

## 2021-10-12 ENCOUNTER — Ambulatory Visit: Payer: 59 | Admitting: Internal Medicine

## 2022-01-24 ENCOUNTER — Encounter: Payer: Self-pay | Admitting: Internal Medicine

## 2022-01-24 NOTE — Progress Notes (Signed)
? ? ?Subjective:  ? ? Patient ID: Bonnie Long, female    DOB: Dec 29, 1995, 26 y.o.   MRN: [id number] ? ?This visit occurred during the SARS-CoV-2 public health emergency.  Safety protocols were in place, including screening questions prior to the visit, additional usage of staff PPE, and extensive cleaning of exam room while observing appropriate contact time as indicated for disinfecting solutions. ? ? ? ?HPI ?Bonnie Long is here for  ?Chief Complaint  ?Patient presents with  ? Fatigue  ?  Fatigue and light headed; Abnormal bleeding has stopped  ? ? ? ?Lightheaded, fatigue, bleeding abnormally - she is having intermittent lightheadedness.  She had a period for one month last month.  She has never been that long.  She states her.  Bonnie Long regular and sometimes are lighter than heavy, but typically never last more than 1 week.   She has increased fatigue.   ? ?She denies any other concerning symptoms. ? ?She did not take her blood pressure medication this morning and she thinks that is why her blood pressure is elevated. ? ? ?Medications and allergies reviewed with patient and updated if appropriate. ? ?Current Outpatient Medications on File Prior to Visit  ?Medication Sig Dispense Refill  ? NIFEdipine (ADALAT CC) 30 MG 24 hr tablet Take 1 tablet (30 mg total) by mouth daily. 30 tablet 1  ? acetaminophen (TYLENOL) 500 MG tablet Take 2 tablets (1,000 mg total) by mouth every 6 (six) hours as needed for mild pain. (Patient not taking: Reported on 01/25/2022) 60 tablet 0  ? ferrous sulfate (FERROUSUL) 325 (65 FE) MG tablet Take 1 tablet (325 mg total) by mouth every other day. (Patient not taking: Reported on 01/25/2022) 30 tablet 3  ? oxyCODONE (OXY IR/ROXICODONE) 5 MG immediate release tablet Take 1-2 tablets (5-10 mg total) by mouth every 4 (four) hours as needed for severe pain. (Patient not taking: Reported on 01/25/2022) 30 tablet 0  ? Prenatal Vit-Fe Fumarate-FA (PRENATAL VITAMIN PO) Take by mouth. (Patient not taking:  Reported on 01/25/2022)    ? senna-docusate (SENOKOT-S) 8.6-50 MG tablet Take 2 tablets by mouth at bedtime as needed for mild constipation or moderate constipation. (Patient not taking: Reported on 01/25/2022) 30 tablet 2  ? ?No current facility-administered medications on file prior to visit.  ? ? ?Review of Systems  ?Constitutional:  Positive for fatigue. Negative for fever.  ?Respiratory:  Negative for cough, shortness of breath and wheezing.   ?Cardiovascular:  Negative for chest pain, palpitations and leg swelling.  ?Gastrointestinal:  Negative for abdominal pain, constipation, diarrhea and nausea.  ?Neurological:  Positive for light-headedness. Negative for dizziness and headaches.  ? ?   ?Objective:  ? ?Vitals:  ? 01/25/22 0907  ?BP: (!) 148/90  ?Pulse: 96  ?Temp: 98.6 ?F (37 ?C)  ?SpO2: 98%  ? ?BP Readings from Last 3 Encounters:  ?01/25/22 (!) 148/90  ?06/21/21 (!) 129/91  ?05/23/21 121/85  ? ?Wt Readings from Last 3 Encounters:  ?01/25/22 262 lb (118.8 kg)  ?06/21/21 238 lb (108 kg)  ?05/17/21 234 lb 3.2 oz (106.2 kg)  ? ?Body mass index is 46.41 kg/m?. ? ?  ?Physical Exam ?Constitutional:   ?   General: She is not in acute distress. ?   Appearance: Normal appearance.  ?HENT:  ?   Head: Normocephalic and atraumatic.  ?Eyes:  ?   Conjunctiva/sclera: Conjunctivae normal.  ?Cardiovascular:  ?   Rate and Rhythm: Normal rate and regular rhythm.  ?   Heart  sounds: Normal heart sounds. No murmur heard. ?Pulmonary:  ?   Effort: Pulmonary effort is normal. No respiratory distress.  ?   Breath sounds: Normal breath sounds. No wheezing.  ?Musculoskeletal:  ?   Cervical back: Neck supple.  ?   Right lower leg: No edema.  ?   Left lower leg: No edema.  ?Lymphadenopathy:  ?   Cervical: No cervical adenopathy.  ?Skin: ?   General: Skin is warm and dry.  ?   Findings: No rash.  ?Neurological:  ?   Mental Status: She is alert. Mental status is at baseline.  ?Psychiatric:     ?   Mood and Affect: Mood normal.     ?    Behavior: Behavior normal.  ? ?   ? ?Lab Results  ?Component Value Date  ? WBC 21.7 (H) 05/08/2021  ? HGB 8.6 (L) 05/08/2021  ? HCT 26.2 (L) 05/08/2021  ? PLT 181 05/08/2021  ? GLUCOSE 78 05/09/2021  ? CHOL 144 04/15/2019  ? TRIG 138.0 04/15/2019  ? HDL 45.30 04/15/2019  ? LDLCALC 71 04/15/2019  ? ALT 18 05/09/2021  ? AST 27 05/09/2021  ? NA 134 (L) 05/09/2021  ? K 4.0 05/09/2021  ? CL 100 05/09/2021  ? CREATININE 1.41 (H) 05/09/2021  ? BUN 17 05/09/2021  ? CO2 26 05/09/2021  ? TSH 3.10 04/15/2019  ? HGBA1C 5.5 11/08/2020  ? ? ? ? ? ?Assessment & Plan:  ? ? ?Hypertension: ?Chronic ?Blood pressure not well controlled today, but she states she did not take her medications today and usually does ?Stressed compliance with medication ?She does not check her blood pressure regularly at home and stressed that she does check it regularly ?Discussed that she has decreased kidney function and the most important thing for preventing further problems with her kidney is keeping her blood pressure well controlled ?Continue nifedipine 30 mg daily ? ?Fatigue, lightheadedness: ?Acute ?She has had a heavy period last month-bled for 1 month and there is a good chance she is anemic which would be the reason for both of the symptoms ?Check CBC, iron panel ?We will also check CMP, A1c, TSH to rule out other causes ?Start iron daily-depending on blood work results may need to increase that to twice daily ?Further evaluation depending on above results ? ?Heavy menses: ?Last month bled for 1 month, which is very unusual for her.  Typically her periods are regular and very how long they last but have never lasted this long ?Advised that if abnormal.'s persist she should see her gynecologist ? ?Hyperglycemia: ?Has had elevated sugars in the past ?We will check A1c to make sure that is not contributing to some of her symptoms ? ? ? ? ?

## 2022-01-25 ENCOUNTER — Ambulatory Visit (INDEPENDENT_AMBULATORY_CARE_PROVIDER_SITE_OTHER): Payer: Medicaid Other | Admitting: Internal Medicine

## 2022-01-25 VITALS — BP 148/90 | HR 96 | Temp 98.6°F | Ht 63.0 in | Wt 262.0 lb

## 2022-01-25 DIAGNOSIS — R739 Hyperglycemia, unspecified: Secondary | ICD-10-CM | POA: Diagnosis not present

## 2022-01-25 DIAGNOSIS — I10 Essential (primary) hypertension: Secondary | ICD-10-CM

## 2022-01-25 DIAGNOSIS — R42 Dizziness and giddiness: Secondary | ICD-10-CM | POA: Diagnosis not present

## 2022-01-25 DIAGNOSIS — R5383 Other fatigue: Secondary | ICD-10-CM | POA: Diagnosis not present

## 2022-01-25 DIAGNOSIS — N920 Excessive and frequent menstruation with regular cycle: Secondary | ICD-10-CM | POA: Diagnosis not present

## 2022-01-25 LAB — CBC WITH DIFFERENTIAL/PLATELET
Basophils Absolute: 0 10*3/uL (ref 0.0–0.1)
Basophils Relative: 0.5 % (ref 0.0–3.0)
Eosinophils Absolute: 0.1 10*3/uL (ref 0.0–0.7)
Eosinophils Relative: 1.7 % (ref 0.0–5.0)
HCT: 42.7 % (ref 36.0–46.0)
Hemoglobin: 13.7 g/dL (ref 12.0–15.0)
Lymphocytes Relative: 30.4 % (ref 12.0–46.0)
Lymphs Abs: 2.2 10*3/uL (ref 0.7–4.0)
MCHC: 32.2 g/dL (ref 30.0–36.0)
MCV: 80.9 fl (ref 78.0–100.0)
Monocytes Absolute: 0.6 10*3/uL (ref 0.1–1.0)
Monocytes Relative: 7.9 % (ref 3.0–12.0)
Neutro Abs: 4.3 10*3/uL (ref 1.4–7.7)
Neutrophils Relative %: 59.5 % (ref 43.0–77.0)
Platelets: 302 10*3/uL (ref 150.0–400.0)
RBC: 5.28 Mil/uL — ABNORMAL HIGH (ref 3.87–5.11)
RDW: 14.3 % (ref 11.5–15.5)
WBC: 7.2 10*3/uL (ref 4.0–10.5)

## 2022-01-25 LAB — COMPREHENSIVE METABOLIC PANEL
ALT: 17 U/L (ref 0–35)
AST: 18 U/L (ref 0–37)
Albumin: 4.2 g/dL (ref 3.5–5.2)
Alkaline Phosphatase: 83 U/L (ref 39–117)
BUN: 15 mg/dL (ref 6–23)
CO2: 31 mEq/L (ref 19–32)
Calcium: 9.4 mg/dL (ref 8.4–10.5)
Chloride: 102 mEq/L (ref 96–112)
Creatinine, Ser: 1.03 mg/dL (ref 0.40–1.20)
GFR: 76.21 mL/min (ref >60.00)
Glucose, Bld: 92 mg/dL (ref 70–99)
Potassium: 4 mEq/L (ref 3.5–5.1)
Sodium: 139 mEq/L (ref 135–145)
Total Bilirubin: 0.7 mg/dL (ref 0.2–1.2)
Total Protein: 7.9 g/dL (ref 6.0–8.3)

## 2022-01-25 LAB — HEMOGLOBIN A1C: Hgb A1c MFr Bld: 5.8 % (ref 4.6–6.5)

## 2022-01-25 LAB — IBC PANEL
Iron: 61 ug/dL (ref 42–145)
Saturation Ratios: 15.1 % — ABNORMAL LOW (ref 20.0–50.0)
TIBC: 404.6 ug/dL (ref 250.0–450.0)
Transferrin: 289 mg/dL (ref 212.0–360.0)

## 2022-01-25 LAB — FERRITIN: Ferritin: 35.3 ng/mL (ref 10.0–291.0)

## 2022-01-25 LAB — TSH: TSH: 2.23 u[IU]/mL (ref 0.35–5.50)

## 2022-01-25 NOTE — Patient Instructions (Signed)
? ? ? ?  Blood work was ordered.   ? ? ?Medications changes include :   start taking iron daily ? ? ? ?

## 2022-02-21 ENCOUNTER — Encounter: Payer: Self-pay | Admitting: Internal Medicine

## 2022-02-21 ENCOUNTER — Other Ambulatory Visit (HOSPITAL_COMMUNITY): Payer: Self-pay

## 2022-02-21 DIAGNOSIS — R7303 Prediabetes: Secondary | ICD-10-CM

## 2022-03-11 ENCOUNTER — Ambulatory Visit
Admission: EM | Admit: 2022-03-11 | Discharge: 2022-03-11 | Disposition: A | Discharge status: Home / Self Care | Payer: Medicaid Other | Attending: Emergency Medicine | Admitting: Emergency Medicine

## 2022-03-11 DIAGNOSIS — R1011 Right upper quadrant pain: Secondary | ICD-10-CM | POA: Diagnosis not present

## 2022-03-11 DIAGNOSIS — R509 Fever, unspecified: Secondary | ICD-10-CM | POA: Diagnosis not present

## 2022-03-11 LAB — WET PREP, GENITAL
Clue Cells Wet Prep HPF POC: NONE SEEN
Sperm: NONE SEEN
Trich, Wet Prep: NONE SEEN
WBC, Wet Prep HPF POC: <10 — AB (ref <10)

## 2022-03-11 LAB — URINALYSIS, ROUTINE W REFLEX MICROSCOPIC
Glucose, UA: NEGATIVE mg/dL
Leukocytes,Ua: NEGATIVE
Nitrite: NEGATIVE
Protein, ur: >300 mg/dL — AB
Specific Gravity, Urine: 1.015 (ref 1.005–1.030)
pH: 5.5 (ref 5.0–8.0)

## 2022-03-11 LAB — URINALYSIS, MICROSCOPIC (REFLEX): RBC / HPF: >50 RBC/hpf (ref 0–5)

## 2022-03-11 LAB — PREGNANCY, URINE: Preg Test, Ur: NEGATIVE

## 2022-03-11 MED ORDER — ACETAMINOPHEN 325 MG PO TABS
650.0000 mg | ORAL_TABLET | Freq: Once | ORAL | Status: AC
  Administered 2022-03-11: 650 mg via ORAL

## 2022-03-11 NOTE — ED Provider Notes (Signed)
[facility]    CSN: [id number] Arrival date & time: 03/11/22  1917      History   Chief Complaint Chief Complaint  Patient presents with   Abdominal Pain    HPI Bonnie Long is a 26 y.o. female.   HPI  25 year old female here for evaluation of abdominal pain and back pain.  Patient reports that her pain started around 10 this morning.  She states that her pain is in her upper abdomen and radiates around to her back.  She is also complained of pain in her low back.  She denies any nausea or vomiting.  She also denies any pain with urination or urinary urgency or frequency.  No vaginal discharge.  She is currently on her menstrual cycle.  She does endorse a decreased appetite however  Past Medical History:  Diagnosis Date   Hypertension     Patient Active Problem List   Diagnosis Date Noted   Acute blood loss as cause of postoperative anemia 05/09/2021   Chorioamnionitis 05/06/2021   Severe pre-eclampsia 05/06/2021   Encounter for supervision of high risk pregnancy in third trimester, antepartum 05/06/2021   Chronic hypertension with superimposed preeclampsia 05/03/2021   Polyhydramnios in third trimester 03/01/2021   Acute cyclitis 08/23/2020   Vitamin D deficiency 04/10/2020   Migraine 04/10/2020   Hypertension 02/11/2018   Obesity (BMI 30-39.9) 01/21/2018    Past Surgical History:  Procedure Laterality Date   CESAREAN SECTION N/A 05/06/2021   Procedure: CESAREAN SECTION;  Surgeon: Aletha Halim, MD;  Location: [facility] LD ORS;  Service: Obstetrics;  Laterality: N/A;   NO PAST SURGERIES      OB History     Gravida  1   Para  1   Term  1   Preterm  0   AB  0   Living  1      SAB  0   IAB  0   Ectopic  0   Multiple  0   Live Births  1            Home Medications    Prior to Admission medications   Medication Sig Start Date End Date Taking? Authorizing Provider  ferrous sulfate (FERROUSUL) 325 (65 FE) MG tablet Take 1  tablet (325 mg total) by mouth every other day. Patient not taking: Reported on 01/25/2022 05/09/21   Osborne Oman, MD  NIFEdipine (ADALAT CC) 30 MG 24 hr tablet Take 1 tablet by mouth daily. 05/09/21   Osborne Oman, MD    Family History Family History  Problem Relation Age of Onset   Pulmonary embolism Mother    Hypertension Maternal Grandfather     Social History Social History   Tobacco Use   Smoking status: Never   Smokeless tobacco: Never  Vaping Use   Vaping Use: Never used  Substance Use Topics   Alcohol use: No   Drug use: No     Allergies   Patient has no known allergies.   Review of Systems Review of Systems  Constitutional:  Positive for appetite change.  Gastrointestinal:  Positive for abdominal pain. Negative for nausea and vomiting.  Genitourinary:  Negative for dysuria, frequency, urgency, vaginal discharge and vaginal pain.  Musculoskeletal:  Positive for back pain.  Hematological: Negative.   Psychiatric/Behavioral: Negative.       Physical Exam Triage Vital Signs ED Triage Vitals  Enc Vitals Group     BP      Pulse  Resp      Temp      Temp src      SpO2      Weight      Height      Head Circumference      Peak Flow      Pain Score      Pain Loc      Pain Edu?      Excl. in [place]?    No data found.  Updated Vital Signs BP (!) 135/92 (BP Location: Left Arm)   Pulse 97   Temp (!) 103 F (39.4 C) (Oral)   Resp 16   LMP 03/11/2022   SpO2 97%   Visual Acuity Right Eye Distance:   Left Eye Distance:   Bilateral Distance:    Right Eye Near:   Left Eye Near:    Bilateral Near:     Physical Exam Vitals and nursing note reviewed.  Constitutional:      Appearance: Normal appearance. She is ill-appearing.  HENT:     Head: Normocephalic and atraumatic.  Cardiovascular:     Rate and Rhythm: Normal rate and regular rhythm.     Pulses: Normal pulses.     Heart sounds: Normal heart sounds. No murmur heard.    No  friction rub. No gallop.  Pulmonary:     Effort: Pulmonary effort is normal.     Breath sounds: Normal breath sounds.  Abdominal:     General: Abdomen is flat.     Palpations: Abdomen is soft.     Tenderness: There is abdominal tenderness. There is no right CVA tenderness, left CVA tenderness, guarding or rebound.  Skin:    General: Skin is warm and dry.     Capillary Refill: Capillary refill takes less than 2 seconds.     Findings: No erythema or rash.  Neurological:     General: No focal deficit present.     Mental Status: She is alert and oriented to person, place, and time.  Psychiatric:        Mood and Affect: Mood normal.        Behavior: Behavior normal.        Thought Content: Thought content normal.        Judgment: Judgment normal.      UC Treatments / Results  Labs (all labs ordered are listed, but only abnormal results are displayed) Labs Reviewed  WET PREP, GENITAL - Abnormal; Notable for the following components:      Result Value   Yeast Wet Prep HPF POC PRESENT (*)    WBC, Wet Prep HPF POC <10 (*)    All other components within normal limits  URINALYSIS, ROUTINE W REFLEX MICROSCOPIC - Abnormal; Notable for the following components:   Color, Urine ORANGE (*)    APPearance HAZY (*)    Hgb urine dipstick LARGE (*)    Bilirubin Urine SMALL (*)    Ketones, ur TRACE (*)    Protein, ur >300 (*)    All other components within normal limits  URINALYSIS, MICROSCOPIC (REFLEX) - Abnormal; Notable for the following components:   Bacteria, UA FEW (*)    All other components within normal limits  PREGNANCY, URINE    EKG   Radiology No results found.  Procedures Procedures (including critical care time)  Medications Ordered in UC Medications  acetaminophen (TYLENOL) tablet 650 mg (650 mg Oral Given 03/11/22 1937)    Initial Impression / Assessment and Plan / UC Course  I have reviewed the triage vital signs and the nursing notes.  Pertinent labs &  imaging results that were available during my care of the patient were reviewed by me and considered in my medical decision making (see chart for details).  Patient is a pleasant though ill-appearing 26 year old female here for evaluation of upper abdominal pain and low back pain that started around 10 AM this morning.  This is not associated with nausea or vomiting.  Also no dysuria, urgency, or frequency of urination.  She is on her menstrual cycle now and denies any vaginal discharge or itching.  On exam patient is a benign cardiopulmonary exam with S1-S2 heart sounds with regular rate and rhythm and lung sounds that are clear to auscultation in all fields.  Patient has no CVA tenderness on exam.  Abdomen is protuberant but tender in her epigastrium and right upper quadrant.  No guarding or rebound.  The remainder of her abdomen is benign.  Patient does have her gallbladder.  Will check UA, U preg, and wet prep.  If those are negative patient will be sent to the emergency department for evaluation of her gallbladder.  Urinalysis shows orange color with hazy appearance, large hemoglobin, small bilirubin, trace ketones, greater than 300 protein.  Leukocyte esterase and nitrites are negative.  Reflex micro shows >50 RBCs, 0-5 WBCs, few bacteria, 11-20 squamous epithelials.  Urine pregnancy test is negative.  Vaginal wet prep is positive for yeast.  Given patient's lack of urinary tract infection, high fever, and upper abdominal pain I will refer her to the emergency department for evaluation of her gallbladder.  Patient has elected to go to [facility].   Final Clinical Impressions(s) / UC Diagnoses   Final diagnoses:  Right upper quadrant abdominal pain  Fever, unspecified     Discharge Instructions      Go [facility] your fever and abdominal pain.  Please go to     ED Prescriptions   None    PDMP not reviewed this encounter.   Margarette Canada,  NP 03/11/22 2008

## 2022-03-11 NOTE — Discharge Instructions (Addendum)
Go to [facility] for evaluation of your fever and abdominal pain.  Please go to

## 2022-03-11 NOTE — ED Triage Notes (Signed)
Pt reports lower back pain and abdominal pain started this morning around 10:00 am.

## 2022-03-14 ENCOUNTER — Encounter: Payer: Medicaid Other | Admitting: Nurse Practitioner

## 2022-03-18 ENCOUNTER — Telehealth: Payer: Medicaid Other | Admitting: Internal Medicine

## 2022-04-06 ENCOUNTER — Other Ambulatory Visit: Payer: Self-pay | Admitting: Obstetrics & Gynecology

## 2022-04-06 ENCOUNTER — Encounter: Payer: Self-pay | Admitting: Internal Medicine

## 2022-04-08 MED ORDER — AMLODIPINE BESYLATE 5 MG PO TABS: 5.0000 mg | ORAL_TABLET | Freq: Every day | ORAL | 3 refills | Status: DC

## 2022-04-10 ENCOUNTER — Other Ambulatory Visit (HOSPITAL_COMMUNITY): Payer: Self-pay

## 2022-05-21 ENCOUNTER — Encounter: Payer: Self-pay | Admitting: Skilled Nursing Facility1

## 2022-05-21 ENCOUNTER — Encounter: Payer: Medicaid Other | Attending: Internal Medicine | Admitting: Skilled Nursing Facility1

## 2022-05-21 DIAGNOSIS — E669 Obesity, unspecified: Secondary | ICD-10-CM | POA: Insufficient documentation

## 2022-05-21 NOTE — Progress Notes (Signed)
Medical Nutrition Therapy  Appointment Start time:  9:30  Appointment End time:  10:30  Primary concerns today: to lose weight  Referral diagnosis: e66.01 Preferred learning style: auditory, visual Learning readiness: contemplating   NUTRITION ASSESSMENT    Clinical Medical Hx: HTN  Medications: amlodipine  Labs: RBC 5.28 Notable Signs/Symptoms: feeling overall tired   Lifestyle & Dietary Hx   Body Composition Scale 05/21/2022  Current Body Weight 262.2  Total Body Fat % 45.6  Visceral Fat 12  Fat-Free Mass % 54.3   Total Body Water % 41.6  Muscle-Mass lbs 33  BMI 46  Body Fat Displacement          Torso  lbs 74.1         Left Leg  lbs 14.8         Right Leg  lbs 14.8         Left Arm  lbs 7.4         Right Arm   lbs 7.4   Pt states she gained a lot of weight after having her baby.  Pt states she would like to be 200 pounds.  Pt states she does have some indigestion. Pt states she has a daily bowel movement.  Pt states she is ready to change. Pt states she does have a personal trainer she will be working with.  Pt states she recently started eating breakfast.   Pt seems very motivated.   Estimated daily fluid intake: unknown oz Supplements: N/A Sleep: 7 hours feeling rested  Stress / self-care: 4 on a scale of 1-10 so well managed  Current average weekly physical activity: ADL's  24-Hr Dietary Recall First Meal: fast food biscuit or eggs + frozen cinn french toast + smoked sausage  Snack: chips Second Meal: fast food Snack:  Third Meal: broccoli or green beans + chicken and brown rice Snack: sometimes dessert Beverages: water, juice, sweet tea    NUTRITION INTERVENTION  Nutrition education (E-1) on the following topics:  Macronutrient distribution Creating balanced meals Physical activity recommendations     Handouts Provided Include  Handout created for pt  Learning Style & Readiness for Change Teaching method utilized: Visual & Auditory   Demonstrated degree of understanding via: Teach Back  Barriers to learning/adherence to lifestyle change: none identified   Goals Established by Pt Breakfast ideas: Complex carb + lean protein Ideas:  Bagel thin + [place] yogurt  English muffin + egg  Fiberone cereal  Yogurt + fruit + granola  Lunch Ideas: Complex carb + lean protein + non starchy vegetable (just like your dinner) Ideas:  Fresh wings in the airfryer with sauce + spinach + brown rice or diced red potato  Pizza: premade dough + pepperoni + cheese + steamed broccoli  Try Arnolds wheat/seeded bread + rotisserie chicken + mayo with a couple chips and cauliflower    MONITORING & EVALUATION Dietary intake, weekly physical activity  Next Steps  Patient is to follow up in 6 weeks.

## 2022-06-19 ENCOUNTER — Encounter: Payer: Self-pay | Admitting: Advanced Practice Midwife

## 2022-07-02 ENCOUNTER — Ambulatory Visit: Payer: Medicaid Other | Admitting: Skilled Nursing Facility1

## 2022-08-15 ENCOUNTER — Ambulatory Visit: Payer: Medicaid Other | Admitting: Internal Medicine

## 2022-09-12 ENCOUNTER — Ambulatory Visit: Payer: BC Managed Care – PPO | Admitting: Adult Health

## 2022-09-12 ENCOUNTER — Encounter: Payer: Self-pay | Admitting: Adult Health

## 2022-09-12 VITALS — BP 138/89 | HR 69 | Ht 63.0 in | Wt 264.0 lb

## 2022-09-12 DIAGNOSIS — N938 Other specified abnormal uterine and vaginal bleeding: Secondary | ICD-10-CM | POA: Diagnosis not present

## 2022-09-12 DIAGNOSIS — Z3202 Encounter for pregnancy test, result negative: Secondary | ICD-10-CM | POA: Insufficient documentation

## 2022-09-12 LAB — POCT URINE PREGNANCY: Preg Test, Ur: NEGATIVE

## 2022-09-12 MED ORDER — MEGESTROL ACETATE 40 MG PO TABS: ORAL_TABLET | ORAL | 0 refills | Status: DC

## 2022-09-12 NOTE — Progress Notes (Signed)
  Subjective:     Patient ID: Bonnie Long, female   DOB: 06-06-1996, 26 y.o.   MRN: [id number]  HPI Bonnie Long is a 26 year old black female, married, G1P1001, in complaining of bleeding for 1 month. Periods regular before this, started spotting 08/03/22 then light bleeding since.   Last pap was NILM 12/06/20  PCP is Dr Jonny Ruiz  Review of Systems Bleeding x 1 month Denies any pain, problems with urination or bowel movement or sex or discharge  Reviewed past medical,surgical, social and family history. Reviewed medications and allergies.     Objective:   Physical Exam BP 138/89 (BP Location: Left Arm, Patient Position: Sitting, Cuff Size: Normal)   Pulse 69   Ht 5\' 3"  (1.6 m)   Wt 264 lb (119.7 kg)   LMP 08/03/2022 (Approximate) Comment: spotted from 11/4 for 6 days then bleeding since  Breastfeeding No   BMI 46.77 kg/m  UPT is negative    Skin warm and dry.Pelvic: external genitalia is normal in appearance no lesions, vagina: + blood,urethra has no lesions or masses noted, cervix:smooth and bulbous, uterus: normal size, shape and contour, non tender, no masses felt, adnexa: no masses or tenderness noted. Bladder is non tender and no masses felt.  Fall risk is low  Upstream - 09/12/22 1431       Pregnancy Intention Screening   Does the patient want to become pregnant in the next year? Yes    Does the patient's partner want to become pregnant in the next year? Yes    Would the patient like to discuss contraceptive options today? No      Contraception Wrap Up   Current Method Pregnant/Seeking Pregnancy    End Method Pregnant/Seeking Pregnancy    Contraception Counseling Provided No            Examination chaperoned by 09/14/22 RN  Assessment:     1. Negative pregnancy test  2. DUB (dysfunctional uterine bleeding) Bleeding for 1 month Will rx megace, use condoms if has sex Meds ordered this encounter  Medications   megestrol (MEGACE) 40 MG tablet    Sig: Take 3 x 5 days  then 2 x 5 days then 1 daily til bleeding stops    Dispense:  45 tablet    Refill:  0    Order Specific Question:   Supervising Provider    Answer:   Dorthula Perfect [[id number]]       Plan:      Follow up in 3 weeks for ROS

## 2022-10-03 ENCOUNTER — Ambulatory Visit: Payer: BC Managed Care – PPO | Admitting: Adult Health

## 2022-10-05 ENCOUNTER — Other Ambulatory Visit: Payer: Self-pay | Admitting: Adult Health

## 2023-01-28 ENCOUNTER — Encounter: Payer: Self-pay | Admitting: Internal Medicine

## 2023-01-28 ENCOUNTER — Ambulatory Visit: Payer: 59 | Admitting: Internal Medicine

## 2023-01-28 VITALS — BP 132/78 | HR 69 | Temp 98.2°F | Ht 63.0 in | Wt 257.0 lb

## 2023-01-28 DIAGNOSIS — I10 Essential (primary) hypertension: Secondary | ICD-10-CM

## 2023-01-28 DIAGNOSIS — Z0001 Encounter for general adult medical examination with abnormal findings: Secondary | ICD-10-CM | POA: Diagnosis not present

## 2023-01-28 DIAGNOSIS — R300 Dysuria: Secondary | ICD-10-CM | POA: Insufficient documentation

## 2023-01-28 DIAGNOSIS — R7303 Prediabetes: Secondary | ICD-10-CM

## 2023-01-28 DIAGNOSIS — E538 Deficiency of other specified B group vitamins: Secondary | ICD-10-CM

## 2023-01-28 DIAGNOSIS — E559 Vitamin D deficiency, unspecified: Secondary | ICD-10-CM

## 2023-01-28 DIAGNOSIS — R739 Hyperglycemia, unspecified: Secondary | ICD-10-CM

## 2023-01-28 LAB — LIPID PANEL
Cholesterol: 157 mg/dL (ref 0–200)
HDL: 39.3 mg/dL (ref >39.00)
LDL Cholesterol: 93 mg/dL (ref 0–99)
NonHDL: 117.34
Total CHOL/HDL Ratio: 4
Triglycerides: 121 mg/dL (ref 0.0–149.0)
VLDL: 24.2 mg/dL (ref 0.0–40.0)

## 2023-01-28 LAB — HEPATIC FUNCTION PANEL
ALT: 14 U/L (ref 0–35)
AST: 18 U/L (ref 0–37)
Albumin: 4.2 g/dL (ref 3.5–5.2)
Alkaline Phosphatase: 58 U/L (ref 39–117)
Bilirubin, Direct: 0.1 mg/dL (ref 0.0–0.3)
Total Bilirubin: 0.6 mg/dL (ref 0.2–1.2)
Total Protein: 7.8 g/dL (ref 6.0–8.3)

## 2023-01-28 LAB — BASIC METABOLIC PANEL
BUN: 16 mg/dL (ref 6–23)
CO2: 30 mEq/L (ref 19–32)
Calcium: 9.4 mg/dL (ref 8.4–10.5)
Chloride: 101 mEq/L (ref 96–112)
Creatinine, Ser: 1.19 mg/dL (ref 0.40–1.20)
GFR: 63.63 mL/min (ref >60.00)
Glucose, Bld: 88 mg/dL (ref 70–99)
Potassium: 4.4 mEq/L (ref 3.5–5.1)
Sodium: 138 mEq/L (ref 135–145)

## 2023-01-28 LAB — CBC WITH DIFFERENTIAL/PLATELET
Basophils Absolute: 0 10*3/uL (ref 0.0–0.1)
Basophils Relative: 0.5 % (ref 0.0–3.0)
Eosinophils Absolute: 0.2 10*3/uL (ref 0.0–0.7)
Eosinophils Relative: 2.8 % (ref 0.0–5.0)
HCT: 40.2 % (ref 36.0–46.0)
Hemoglobin: 13.2 g/dL (ref 12.0–15.0)
Lymphocytes Relative: 38.5 % (ref 12.0–46.0)
Lymphs Abs: 2.5 10*3/uL (ref 0.7–4.0)
MCHC: 32.9 g/dL (ref 30.0–36.0)
MCV: 75.9 fl — ABNORMAL LOW (ref 78.0–100.0)
Monocytes Absolute: 0.5 10*3/uL (ref 0.1–1.0)
Monocytes Relative: 7.2 % (ref 3.0–12.0)
Neutro Abs: 3.3 10*3/uL (ref 1.4–7.7)
Neutrophils Relative %: 51 % (ref 43.0–77.0)
Platelets: 374 10*3/uL (ref 150.0–400.0)
RBC: 5.29 Mil/uL — ABNORMAL HIGH (ref 3.87–5.11)
RDW: 16.3 % — ABNORMAL HIGH (ref 11.5–15.5)
WBC: 6.4 10*3/uL (ref 4.0–10.5)

## 2023-01-28 LAB — URINALYSIS, ROUTINE W REFLEX MICROSCOPIC
Bilirubin Urine: NEGATIVE
Hgb urine dipstick: NEGATIVE
Ketones, ur: NEGATIVE
Leukocytes,Ua: NEGATIVE
Nitrite: NEGATIVE
Specific Gravity, Urine: 1.02 (ref 1.000–1.030)
Total Protein, Urine: NEGATIVE
Urine Glucose: NEGATIVE
Urobilinogen, UA: 0.2 (ref 0.0–1.0)
pH: 6 (ref 5.0–8.0)

## 2023-01-28 LAB — VITAMIN B12: Vitamin B-12: 548 pg/mL (ref 211–911)

## 2023-01-28 LAB — HEMOGLOBIN A1C: Hgb A1c MFr Bld: 6 % (ref 4.6–6.5)

## 2023-01-28 LAB — TSH: TSH: 2.27 u[IU]/mL (ref 0.35–5.50)

## 2023-01-28 LAB — VITAMIN D 25 HYDROXY (VIT D DEFICIENCY, FRACTURES): VITD: 24.04 ng/mL — ABNORMAL LOW (ref 30.00–100.00)

## 2023-01-28 MED ORDER — AMLODIPINE BESYLATE 10 MG PO TABS: 10.0000 mg | ORAL_TABLET | Freq: Every day | ORAL | 3 refills | Status: DC

## 2023-01-28 NOTE — Progress Notes (Unsigned)
Patient ID: Bonnie Long, female   DOB: 05/04/1996, 27 y.o.   MRN: [id number]         Chief Complaint:: wellness exam and htn, dysuria, low vit d       HPI:  Bonnie Long is a 27 y.o. female here for wellness exam; declines hpv vax, covid booster, o/w up to date                        Also Pt denies chest pain, increased sob or doe, wheezing, orthopnea, PND, increased LE swelling, palpitations, dizziness or syncope.   Pt denies polydipsia, polyuria, or new focal neuro s/s.    Pt denies fever, wt loss, night sweats, loss of appetite, or other constitutional symptoms.  Denies urinary symptoms such as dysuria, frequency, urgency, flank pain, hematuria or n/v, fever, chills, except for mild dysuria x 3 days.  BP has been mild eleavted at home as well.  No plans to be pregnant again soon.    Wt Readings from Last 3 Encounters:  01/28/23 257 lb (116.6 kg)  09/12/22 264 lb (119.7 kg)  05/21/22 262 lb 3.2 oz (118.9 kg)   BP Readings from Last 3 Encounters:  01/28/23 132/78  09/12/22 138/89  03/11/22 (!) 135/92   Immunization History  Administered Date(s) Administered   DTaP 11/24/1997, 01/23/1998, 03/27/1998, 12/11/1998   HIB (PRP-OMP) 11/24/1997, 01/23/1998, 03/27/1998, 10/03/1998   HPV Quadrivalent 12/28/2015   Hep B, Unspecified March 30, 1998, 11/24/1997, 03/27/1998   Hepatitis A, Ped/Adol-2 Dose 11/10/2015   IPV 11/24/1997, 01/23/1998, 03/27/1998, 11/10/2015   Influenza Nasal 07/18/2008   Influenza,inj,Quad PF,6+ Mos 08/13/2022   Influenza,inj,quad, With Preservative 11/10/2015   Influenza-Unspecified 10/31/2020   MMR 10/03/1998, 11/11/2001   Meningococcal Conjugate 01/18/2016   PFIZER(Purple Top)SARS-COV-2 Vaccination 03/30/2020, 04/27/2020   PPD Test 10/02/2021   Tdap 06/13/2009, 03/01/2021   Varicella 10/03/1998, 11/10/2015   Health Maintenance Due  Topic Date Due   HPV VACCINES (2 - 3-dose series) 01/25/2016      Past Medical History:  Diagnosis Date   Hypertension     Past Surgical History:  Procedure Laterality Date   CESAREAN SECTION N/A 05/06/2021   Procedure: CESAREAN SECTION;  Surgeon: [place] Bing, MD;  Location: [facility];  Service: Obstetrics;  Laterality: N/A;   NO PAST SURGERIES      reports that she has never smoked. She has never used smokeless tobacco. She reports that she does not drink alcohol and does not use drugs. family history includes Hypertension in her maternal grandfather; Pulmonary embolism in her mother. No Known Allergies Current Outpatient Medications on File Prior to Visit  Medication Sig Dispense Refill   ferrous sulfate (FERROUSUL) 325 (65 FE) MG tablet Take 1 tablet (325 mg total) by mouth every other day. (Patient not taking: Reported on 01/25/2022) 30 tablet 3   megestrol (MEGACE) 40 MG tablet Take 3 x 5 days then 2 x 5 days then 1 daily til bleeding stops (Patient not taking: Reported on 01/28/2023) 45 tablet 0   No current facility-administered medications on file prior to visit.        ROS:  All others reviewed and negative.  Objective        PE:  BP 132/78 (BP Location: Right Arm, Patient Position: Sitting, Cuff Size: Normal)   Pulse 69   Temp 98.2 F (36.8 C) (Oral)   Ht 5\' 3"  (1.6 m)   Wt 257 lb (116.6 kg)   LMP 01/09/2023 (Exact Date)  SpO2 98%   BMI 45.53 kg/m                 Constitutional: Pt appears in NAD               HENT: Head: NCAT.                Right Ear: External ear normal.                 Left Ear: External ear normal.                Eyes: . Pupils are equal, round, and reactive to light. Conjunctivae and EOM are normal               Nose: without d/c or deformity               Neck: Neck supple. Gross normal ROM               Cardiovascular: Normal rate and regular rhythm.                 Pulmonary/Chest: Effort normal and breath sounds without rales or wheezing.                Abd:  Soft, NT, ND, + BS, no organomegaly               Neurological: Pt is alert. At baseline  orientation, motor grossly intact               Skin: Skin is warm. No rashes, no other new lesions, LE edema - none               Psychiatric: Pt behavior is normal without agitation   Micro: none  Cardiac tracings I have personally interpreted today:  none  Pertinent Radiological findings (summarize): none   Lab Results  Component Value Date   WBC 6.4 01/28/2023   HGB 13.2 01/28/2023   HCT 40.2 01/28/2023   PLT 374.0 01/28/2023   GLUCOSE 88 01/28/2023   CHOL 157 01/28/2023   TRIG 121.0 01/28/2023   HDL 39.30 01/28/2023   LDLCALC 93 01/28/2023   ALT 14 01/28/2023   AST 18 01/28/2023   NA 138 01/28/2023   K 4.4 01/28/2023   CL 101 01/28/2023   CREATININE 1.19 01/28/2023   BUN 16 01/28/2023   CO2 30 01/28/2023   TSH 2.27 01/28/2023   HGBA1C 6.0 01/28/2023   Assessment/Plan:  Bonnie Long is a 27 y.o. Black or African American [2] female with  has a past medical history of Hypertension.  Encounter for well adult exam with abnormal findings Age and sex appropriate education and counseling updated with regular exercise and diet Referrals for preventative services - none needed Immunizations addressed - declines hpv vax and covid booster Smoking counseling  - none needed Evidence for depression or other mood disorder - none significant Most recent labs reviewed. I have personally reviewed and have noted: 1) the patient's medical and social history 2) The patient's current medications and supplements 3) The patient's height, weight, and BMI have been recorded in the chart   Hypertension BP Readings from Last 3 Encounters:  01/28/23 132/78  09/12/22 138/89  03/11/22 (!) 135/92   Uncontrolled, goal < 130/80, pt to increase medical treatment amlodipine 10 mg  Vitamin D deficiency Last vitamin D Lab Results  Component Value Date   VD25OH 24.04 (L) 01/28/2023   Low, to start oral replacement  Dysuria Exam benign, but can't r/o uti - for urine and  culture  Followup: Return in about 1 year (around 01/28/2024).  Bonnie Barre, MD 01/30/2023 9:11 PM [facility]

## 2023-01-28 NOTE — Patient Instructions (Signed)
Ok to increase the amlodipine to 10 mg per day  Please continue all other medications as before, and refills have been done if requested.  Please have the pharmacy call with any other refills you may need.  Please continue your efforts at being more active, low cholesterol diet, and weight control.  You are otherwise up to date with prevention measures today.  Please keep your appointments with your specialists as you may have planned  Please go to the LAB at the blood drawing area for the tests to be done  You will be contacted by phone if any changes need to be made immediately.  Otherwise, you will receive a letter about your results with an explanation, but please check with MyChart first.  Please remember to sign up for MyChart if you have not done so, as this will be important to you in the future with finding out test results, communicating by private email, and scheduling acute appointments online when needed.  Please make an Appointment to return for your 1 year visit, or sooner if needed

## 2023-01-30 ENCOUNTER — Encounter: Payer: Self-pay | Admitting: Internal Medicine

## 2023-01-30 ENCOUNTER — Other Ambulatory Visit: Payer: Self-pay | Admitting: Internal Medicine

## 2023-01-30 LAB — URINE CULTURE

## 2023-01-30 MED ORDER — CEPHALEXIN 500 MG PO CAPS: 500.0000 mg | ORAL_CAPSULE | Freq: Three times a day (TID) | ORAL | 0 refills | Status: DC

## 2023-01-30 NOTE — Assessment & Plan Note (Signed)
Exam benign, but can't r/o uti - for urine and culture

## 2023-01-30 NOTE — Assessment & Plan Note (Signed)
Age and sex appropriate education and counseling updated with regular exercise and diet Referrals for preventative services - none needed Immunizations addressed - declines hpv vax and covid booster Smoking counseling  - none needed Evidence for depression or other mood disorder - none significant Most recent labs reviewed. I have personally reviewed and have noted: 1) the patient's medical and social history 2) The patient's current medications and supplements 3) The patient's height, weight, and BMI have been recorded in the chart

## 2023-01-30 NOTE — Assessment & Plan Note (Signed)
BP Readings from Last 3 Encounters:  01/28/23 132/78  09/12/22 138/89  03/11/22 (!) 135/92   Uncontrolled, goal < 130/80, pt to increase medical treatment amlodipine 10 mg

## 2023-01-30 NOTE — Assessment & Plan Note (Signed)
Last vitamin D Lab Results  Component Value Date   VD25OH 24.04 (L) 01/28/2023   Low, to start oral replacement

## 2023-02-25 ENCOUNTER — Encounter: Payer: Self-pay | Admitting: Internal Medicine

## 2023-02-25 ENCOUNTER — Telehealth: Payer: 59 | Admitting: Internal Medicine

## 2023-02-25 DIAGNOSIS — F419 Anxiety disorder, unspecified: Secondary | ICD-10-CM | POA: Insufficient documentation

## 2023-02-25 DIAGNOSIS — R1011 Right upper quadrant pain: Secondary | ICD-10-CM | POA: Insufficient documentation

## 2023-02-25 DIAGNOSIS — E559 Vitamin D deficiency, unspecified: Secondary | ICD-10-CM | POA: Diagnosis not present

## 2023-02-25 HISTORY — DX: Anxiety disorder, unspecified: F41.9

## 2023-02-25 MED ORDER — CITALOPRAM HYDROBROMIDE 20 MG PO TABS: 20.0000 mg | ORAL_TABLET | Freq: Every day | ORAL | 3 refills | Status: DC

## 2023-02-25 NOTE — Assessment & Plan Note (Signed)
Last vitamin D Lab Results  Component Value Date   VD25OH 24.04 (L) 01/28/2023   Low, to start oral replacement  

## 2023-02-25 NOTE — Patient Instructions (Signed)
Please take all new medication as prescribed - the celexa 20 mg per day  Please continue all other medications as before, and refills have been done if requested.  Please have the pharmacy call with any other refills you may need.  Please keep your appointments with your specialists as you may have planned  You will be contacted regarding the referral for: Abdomen ultrasound, and counseling referral

## 2023-02-25 NOTE — Assessment & Plan Note (Signed)
Mild to mod worsening unclear reason, for celexa 20 qd, and refer counseling

## 2023-02-25 NOTE — Progress Notes (Signed)
Patient ID: Bonnie Long, female   DOB: [birth year], 27 y.o.   MRN: [id number]  Virtual Visit via Video Note  I connected with Bonnie Long on 02/25/23 at  2:40 PM EDT by a video enabled telemedicine application and verified that I am speaking with the correct person using two identifiers.  Location of all participants today Patient: at home Provider: at office   I discussed the limitations of evaluation and management by telemedicine and the availability of in person appointments. The patient expressed understanding and agreed to proceed.  History of Present Illness: Here with 2 wks onset ruq pain dull mild to mod intermittent with radiation to the shoulders, with mild nausea, but Denies worsening reflux, other abd pain, dysphagia, vomiting, bowel change or blood.  Pt denies chest pain, increased sob or doe, wheezing, orthopnea, PND, increased LE swelling, palpitations, dizziness or syncope.   Pt denies polydipsia, polyuria, or new focal neuro s/s.   Denies worsening depressive symptoms, suicidal ideation, or panic; has ongoing anxiety, worsening recently for unclear reason.  Pt is amenable to new celexa and counseling referral.  Not pregnancy currently. Past Medical History:  Diagnosis Date   Hypertension    Past Surgical History:  Procedure Laterality Date   CESAREAN SECTION N/A 05/06/2021   Procedure: CESAREAN SECTION;  Surgeon: Gove Bing, MD;  Location: [facility] LD ORS;  Service: Obstetrics;  Laterality: N/A;   NO PAST SURGERIES      reports that she has never smoked. She has never used smokeless tobacco. She reports that she does not drink alcohol and does not use drugs. family history includes Hypertension in her maternal grandfather; Pulmonary embolism in her mother. No Known Allergies Current Outpatient Medications on File Prior to Visit  Medication Sig Dispense Refill   amLODipine (NORVASC) 10 MG tablet Take 1 tablet (10 mg total) by mouth daily. 90 tablet 3   cephALEXin  (KEFLEX) 500 MG capsule Take 1 capsule (500 mg total) by mouth 3 (three) times daily. 30 capsule 0   No current facility-administered medications on file prior to visit.    Observations/Objective: Alert, NAD, appropriate mood and affect, resps normal, cn 2-12 intact, moves all 4s, no visible rash or swelling Lab Results  Component Value Date   WBC 6.4 01/28/2023   HGB 13.2 01/28/2023   HCT 40.2 01/28/2023   PLT 374.0 01/28/2023   GLUCOSE 88 01/28/2023   CHOL 157 01/28/2023   TRIG 121.0 01/28/2023   HDL 39.30 01/28/2023   LDLCALC 93 01/28/2023   ALT 14 01/28/2023   AST 18 01/28/2023   NA 138 01/28/2023   K 4.4 01/28/2023   CL 101 01/28/2023   CREATININE 1.19 01/28/2023   BUN 16 01/28/2023   CO2 30 01/28/2023   TSH 2.27 01/28/2023   HGBA1C 6.0 01/28/2023   Assessment and Plan: See notes  Follow Up Instructions: See notes   I discussed the assessment and treatment plan with the patient. The patient was provided an opportunity to ask questions and all were answered. The patient agreed with the plan and demonstrated an understanding of the instructions.   The patient was advised to call back or seek an in-person evaluation if the symptoms worsen or if the condition fails to improve as anticipated.   Oliver Barre, MD

## 2023-02-25 NOTE — Assessment & Plan Note (Signed)
Etiology unclear, recent labs with normal lfts and cbc,declines further lab, but for abd u/s

## 2023-02-27 ENCOUNTER — Encounter: Payer: Self-pay | Admitting: Internal Medicine

## 2023-02-27 ENCOUNTER — Ambulatory Visit
Admission: RE | Admit: 2023-02-27 | Discharge: 2023-02-27 | Disposition: A | Discharge status: Home / Self Care | Payer: 59 | Source: Ambulatory Visit | Attending: Internal Medicine | Admitting: Internal Medicine

## 2023-02-27 DIAGNOSIS — R1011 Right upper quadrant pain: Secondary | ICD-10-CM

## 2023-03-06 ENCOUNTER — Ambulatory Visit
Admission: RE | Admit: 2023-03-06 | Discharge: 2023-03-06 | Disposition: A | Discharge status: Home / Self Care | Payer: 59 | Source: Ambulatory Visit | Attending: Nurse Practitioner | Admitting: Nurse Practitioner

## 2023-03-06 DIAGNOSIS — S80211A Abrasion, right knee, initial encounter: Secondary | ICD-10-CM | POA: Diagnosis not present

## 2023-03-06 DIAGNOSIS — S20212A Contusion of left front wall of thorax, initial encounter: Secondary | ICD-10-CM

## 2023-03-06 MED ORDER — CYCLOBENZAPRINE HCL 10 MG PO TABS: 10.0000 mg | ORAL_TABLET | Freq: Two times a day (BID) | ORAL | 0 refills | Status: DC | PRN

## 2023-03-06 MED ORDER — NAPROXEN 375 MG PO TABS: 375.0000 mg | ORAL_TABLET | Freq: Two times a day (BID) | ORAL | 0 refills | Status: AC

## 2023-03-06 NOTE — Discharge Instructions (Signed)
Start naproxen twice daily as needed for 5 days. Flexeril as needed.  Please note this medication can make you drowsy.  Do not drink alcohol or drive on this medication Heat to the chest as needed Rest Please follow-up with your PCP if your symptoms do not improve Please go to the emergency room if you develop any worsening symptoms
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
## 2023-08-17 ENCOUNTER — Other Ambulatory Visit: Payer: Self-pay

## 2023-08-17 ENCOUNTER — Inpatient Hospital Stay (HOSPITAL_COMMUNITY)
Admission: AD | Admit: 2023-08-17 | Discharge: 2023-08-17 | Disposition: A | Discharge status: Home / Self Care | Payer: BLUE CROSS/BLUE SHIELD | Attending: Obstetrics & Gynecology | Admitting: Obstetrics & Gynecology

## 2023-08-17 ENCOUNTER — Inpatient Hospital Stay (HOSPITAL_COMMUNITY): Payer: BLUE CROSS/BLUE SHIELD

## 2023-08-17 ENCOUNTER — Inpatient Hospital Stay (HOSPITAL_COMMUNITY): Payer: BLUE CROSS/BLUE SHIELD | Admitting: Certified Registered Nurse Anesthetist

## 2023-08-17 ENCOUNTER — Encounter (HOSPITAL_COMMUNITY): Payer: Self-pay | Admitting: Obstetrics & Gynecology

## 2023-08-17 ENCOUNTER — Encounter (HOSPITAL_COMMUNITY)
Admission: AD | Disposition: A | Discharge status: Home / Self Care | Payer: Self-pay | Source: Home / Self Care | Attending: Obstetrics & Gynecology

## 2023-08-17 DIAGNOSIS — O021 Missed abortion: Secondary | ICD-10-CM | POA: Insufficient documentation

## 2023-08-17 DIAGNOSIS — Z79899 Other long term (current) drug therapy: Secondary | ICD-10-CM | POA: Diagnosis not present

## 2023-08-17 DIAGNOSIS — O034 Incomplete spontaneous abortion without complication: Secondary | ICD-10-CM | POA: Diagnosis present

## 2023-08-17 DIAGNOSIS — Z3A12 12 weeks gestation of pregnancy: Secondary | ICD-10-CM | POA: Diagnosis not present

## 2023-08-17 DIAGNOSIS — I10 Essential (primary) hypertension: Secondary | ICD-10-CM | POA: Diagnosis not present

## 2023-08-17 HISTORY — DX: Encounter for other specified aftercare: Z51.89

## 2023-08-17 HISTORY — PX: DILATION AND EVACUATION: SHX1459

## 2023-08-17 HISTORY — DX: Gestational (pregnancy-induced) hypertension without significant proteinuria, unspecified trimester: O13.9

## 2023-08-17 HISTORY — DX: Urinary tract infection, site not specified: N39.0

## 2023-08-17 LAB — POCT I-STAT EG7
Acid-base deficit: 3 mmol/L — ABNORMAL HIGH (ref 0.0–2.0)
Bicarbonate: 23 mmol/L (ref 20.0–28.0)
Calcium, Ion: 1.16 mmol/L (ref 1.15–1.40)
HCT: 33 % — ABNORMAL LOW (ref 36.0–46.0)
Hemoglobin: 11.2 g/dL — ABNORMAL LOW (ref 12.0–15.0)
O2 Saturation: 49 %
Potassium: 4 mmol/L (ref 3.5–5.1)
Sodium: 135 mmol/L (ref 135–145)
TCO2: 24 mmol/L (ref 22–32)
pCO2, Ven: 45.4 mm[Hg] (ref 44–60)
pH, Ven: 7.312 (ref 7.25–7.43)
pO2, Ven: 29 mm[Hg] — CL (ref 32–45)

## 2023-08-17 LAB — CBC WITH DIFFERENTIAL/PLATELET
Abs Immature Granulocytes: 0.06 10*3/uL (ref 0.00–0.07)
Basophils Absolute: 0 10*3/uL (ref 0.0–0.1)
Basophils Relative: 0 %
Eosinophils Absolute: 0 10*3/uL (ref 0.0–0.5)
Eosinophils Relative: 0 %
HCT: 33 % — ABNORMAL LOW (ref 36.0–46.0)
Hemoglobin: 10.6 g/dL — ABNORMAL LOW (ref 12.0–15.0)
Immature Granulocytes: 0 %
Lymphocytes Relative: 9 %
Lymphs Abs: 1.5 10*3/uL (ref 0.7–4.0)
MCH: 25.4 pg — ABNORMAL LOW (ref 26.0–34.0)
MCHC: 32.1 g/dL (ref 30.0–36.0)
MCV: 78.9 fL — ABNORMAL LOW (ref 80.0–100.0)
Monocytes Absolute: 0.7 10*3/uL (ref 0.1–1.0)
Monocytes Relative: 5 %
Neutro Abs: 14.2 10*3/uL — ABNORMAL HIGH (ref 1.7–7.7)
Neutrophils Relative %: 86 %
Platelets: 309 10*3/uL (ref 150–400)
RBC: 4.18 MIL/uL (ref 3.87–5.11)
RDW: 14.2 % (ref 11.5–15.5)
WBC: 16.5 10*3/uL — ABNORMAL HIGH (ref 4.0–10.5)
nRBC: 0 % (ref 0.0–0.2)

## 2023-08-17 LAB — TYPE AND SCREEN
ABO/RH(D): A POS
Antibody Screen: NEGATIVE

## 2023-08-17 SURGERY — DILATION AND EVACUATION, UTERUS
Anesthesia: General

## 2023-08-17 MED ORDER — LACTATED RINGERS IV SOLN: INTRAVENOUS | Status: DC

## 2023-08-17 MED ORDER — DOXYCYCLINE HYCLATE 100 MG IV SOLR
200.0000 mg | INTRAVENOUS | Status: AC
  Administered 2023-08-17: 200 mg via INTRAVENOUS
  Filled 2023-08-17: qty 200

## 2023-08-17 MED ORDER — FENTANYL CITRATE (PF) 250 MCG/5ML IJ SOLN
INTRAMUSCULAR | Status: DC | PRN
  Administered 2023-08-17 (×2): 50 ug via INTRAVENOUS

## 2023-08-17 MED ORDER — PROPOFOL 10 MG/ML IV BOLUS
INTRAVENOUS | Status: DC | PRN
  Administered 2023-08-17: 200 mg via INTRAVENOUS

## 2023-08-17 MED ORDER — OXYCODONE HCL 5 MG PO TABS: 5.0000 mg | ORAL_TABLET | Freq: Once | ORAL | Status: DC | PRN

## 2023-08-17 MED ORDER — KETOROLAC TROMETHAMINE 30 MG/ML IJ SOLN
30.0000 mg | Freq: Once | INTRAMUSCULAR | Status: AC
  Administered 2023-08-17: 30 mg via INTRAVENOUS
  Filled 2023-08-17: qty 1

## 2023-08-17 MED ORDER — 0.9 % SODIUM CHLORIDE (POUR BTL) OPTIME
TOPICAL | Status: DC | PRN
  Administered 2023-08-17: 1000 mL

## 2023-08-17 MED ORDER — BUPIVACAINE HCL (PF) 0.5 % IJ SOLN
INTRAMUSCULAR | Status: DC | PRN
  Administered 2023-08-17: 10 mL

## 2023-08-17 MED ORDER — OXYCODONE HCL 5 MG/5ML PO SOLN: 5.0000 mg | Freq: Once | ORAL | Status: DC | PRN

## 2023-08-17 MED ORDER — MIDAZOLAM HCL 2 MG/2ML IJ SOLN
INTRAMUSCULAR | Status: AC
Start: 2023-08-17 — End: ?
  Filled 2023-08-17: qty 2

## 2023-08-17 MED ORDER — OXYCODONE-ACETAMINOPHEN 5-325 MG PO TABS: 1.0000 | ORAL_TABLET | ORAL | 0 refills | Status: DC | PRN

## 2023-08-17 MED ORDER — FENTANYL CITRATE (PF) 100 MCG/2ML IJ SOLN
25.0000 ug | INTRAMUSCULAR | Status: DC | PRN
Start: 2023-08-17 — End: 2023-08-17

## 2023-08-17 MED ORDER — ONDANSETRON HCL 4 MG/2ML IJ SOLN: 4.0000 mg | Freq: Four times a day (QID) | INTRAMUSCULAR | Status: DC | PRN

## 2023-08-17 MED ORDER — ORAL CARE MOUTH RINSE: 15.0000 mL | Freq: Once | OROMUCOSAL | Status: AC

## 2023-08-17 MED ORDER — ONDANSETRON HCL 4 MG/2ML IJ SOLN
INTRAMUSCULAR | Status: DC | PRN
  Administered 2023-08-17: 4 mg via INTRAVENOUS

## 2023-08-17 MED ORDER — PHENYLEPHRINE 80 MCG/ML (10ML) SYRINGE FOR IV PUSH (FOR BLOOD PRESSURE SUPPORT)
PREFILLED_SYRINGE | INTRAVENOUS | Status: DC | PRN
  Administered 2023-08-17 (×2): 80 ug via INTRAVENOUS

## 2023-08-17 MED ORDER — LIDOCAINE 2% (20 MG/ML) 5 ML SYRINGE
INTRAMUSCULAR | Status: DC | PRN
  Administered 2023-08-17: 60 mg via INTRAVENOUS

## 2023-08-17 MED ORDER — CHLORHEXIDINE GLUCONATE 0.12 % MT SOLN
OROMUCOSAL | Status: AC
  Filled 2023-08-17: qty 15

## 2023-08-17 MED ORDER — CHLORHEXIDINE GLUCONATE 0.12 % MT SOLN
15.0000 mL | Freq: Once | OROMUCOSAL | Status: AC
Start: 2023-08-17 — End: 2023-08-17
  Administered 2023-08-17: 15 mL via OROMUCOSAL
  Filled 2023-08-17: qty 15

## 2023-08-17 MED ORDER — FENTANYL CITRATE (PF) 250 MCG/5ML IJ SOLN
INTRAMUSCULAR | Status: AC
  Filled 2023-08-17: qty 5

## 2023-08-17 MED ORDER — MIDAZOLAM HCL 2 MG/2ML IJ SOLN
INTRAMUSCULAR | Status: DC | PRN
  Administered 2023-08-17: 2 mg via INTRAVENOUS

## 2023-08-17 MED ORDER — PROPOFOL 500 MG/50ML IV EMUL
INTRAVENOUS | Status: DC | PRN
  Administered 2023-08-17: 200 ug/kg/min via INTRAVENOUS

## 2023-08-17 MED ORDER — BUPIVACAINE HCL (PF) 0.5 % IJ SOLN
INTRAMUSCULAR | Status: AC
Start: 2023-08-17 — End: ?
  Filled 2023-08-17: qty 30

## 2023-08-17 MED ORDER — LACTATED RINGERS IV BOLUS
1000.0000 mL | Freq: Once | INTRAVENOUS | Status: AC
  Administered 2023-08-17: 1000 mL via INTRAVENOUS

## 2023-08-17 MED ORDER — DEXAMETHASONE SODIUM PHOSPHATE 10 MG/ML IJ SOLN
INTRAMUSCULAR | Status: DC | PRN
  Administered 2023-08-17: 10 mg via INTRAVENOUS

## 2023-08-17 SURGICAL SUPPLY — 23 items
CATH ROBINSON RED A/P 16FR (CATHETERS) ×4 IMPLANT
CNTNR URN SCR LID CUP LEK RST (MISCELLANEOUS) ×2 IMPLANT
CONT SPEC 4OZ STRL OR WHT (MISCELLANEOUS) ×1
FILTER UTR ASPR ASSEMBLY (MISCELLANEOUS) ×2 IMPLANT
GLOVE BIO SURGEON STRL SZ 6.5 (GLOVE) ×4 IMPLANT
GLOVE BIOGEL PI IND STRL 7.0 (GLOVE) ×8 IMPLANT
GOWN STRL REUS W/ TWL LRG LVL3 (GOWN DISPOSABLE) ×8 IMPLANT
GOWN STRL REUS W/TWL LRG LVL3 (GOWN DISPOSABLE) ×4
HOSE CONNECTING 18IN BERKELEY (TUBING) ×2 IMPLANT
KIT BERKELEY 1ST TRI 3/8 NO TR (MISCELLANEOUS) ×2 IMPLANT
KIT BERKELEY 1ST TRIMESTER 3/8 (MISCELLANEOUS) ×2 IMPLANT
KIT TURNOVER KIT B (KITS) ×2 IMPLANT
NS IRRIG 1000ML POUR BTL (IV SOLUTION) ×4 IMPLANT
PACK VAGINAL MINOR WOMEN LF (CUSTOM PROCEDURE TRAY) ×4 IMPLANT
PAD OB MATERNITY 4.3X12.25 (PERSONAL CARE ITEMS) ×4 IMPLANT
SET BERKELEY SUCTION TUBING (SUCTIONS) IMPLANT
SPIKE FLUID TRANSFER (MISCELLANEOUS) IMPLANT
TOWEL GREEN STERILE FF (TOWEL DISPOSABLE) ×8 IMPLANT
UNDERPAD 30X36 HEAVY ABSORB (UNDERPADS AND DIAPERS) ×4 IMPLANT
VACURETTE 10 RIGID CVD (CANNULA) IMPLANT
VACURETTE 7MM CVD STRL WRAP (CANNULA) IMPLANT
VACURETTE 8 RIGID CVD (CANNULA) IMPLANT
VACURETTE 9 RIGID CVD (CANNULA) IMPLANT

## 2023-08-17 NOTE — Transfer of Care (Signed)
Immediate Anesthesia Transfer of Care Note  Patient: Bonnie Long  Procedure(s) Performed: DILATATION AND EVACUATION  Patient Location: PACU  Anesthesia Type:General  Level of Consciousness: drowsy  Airway & Oxygen Therapy: Patient Spontanous Breathing and Patient connected to face mask oxygen  Post-op Assessment: Report given to RN and Post -op Vital signs reviewed and stable  Post vital signs: Reviewed and stable  Last Vitals:  Vitals Value Taken Time  BP 75/46 08/17/23 1638  Temp 36.1 C 08/17/23 1638  Pulse 106 08/17/23 1642  Resp 16 08/17/23 1642  SpO2 99 % 08/17/23 1642  Vitals shown include unfiled device data.  Last Pain:  Vitals:   08/17/23 1500  TempSrc: Oral  PainSc:          Complications: No notable events documented.

## 2023-08-17 NOTE — MAU Note (Signed)
At bedside with CNM after husband called out stating pt had just passed "a lot of blood".  CNM assisted pt with changing pad, RN weighed pads, EBL currently about 383.

## 2023-08-17 NOTE — MAU Note (Signed)
Back from [place],  reports feeling hot and dizzy. . Pain now a 5/10, will notify provider. BP dropped to 110/67

## 2023-08-17 NOTE — H&P (Addendum)
Chief Complaint:  Vaginal Bleeding  HPI  Event Date/Time  First Provider Initiated Contact with Patient 08/17/23 1430     Bonnie Long is a 27 y.o. G2P1001 at [redacted]w[redacted]d who presents to maternity admissions reporting severe cramping with heavy vaginal bleeding. Woke up to severe cramps this morning that got progressively worse and then noted bright red bleeding with large clots. Went to the bathroom on arrival to MAU and subsequently passed the baby. Continues to have episodes of worsened cramping followed by large clots. Denies nausea/vomiting, headache, or any other physical complaints.   Pregnancy Course: Has chronic hypertension on 60mg  daily nifedipine. So far uncomplicated, has begun care in [place]. Initial prenatal records reviewed. Starting hemoglobin 13.6 on 07/16/23.  Past Medical History:  Diagnosis Date   Blood transfusion without reported diagnosis    Hypertension    Pregnancy induced hypertension    UTI (urinary tract infection)    OB History  Gravida Para Term Preterm AB Living  2 1 1  0 0 1  SAB IAB Ectopic Multiple Live Births  0 0 0 0 1    # Outcome Date GA Lbr Len/2nd Weight Sex Type Anes PTL Lv  2 Current           1 Term 05/06/21 [redacted]w[redacted]d  6 lb 7.2 oz (2.925 kg) M CS-LTranv EPI  LIV    Obstetric Comments  Pre-e, failed induction with first, PPH   Past Surgical History:  Procedure Laterality Date   CESAREAN SECTION N/A 05/06/2021   Procedure: CESAREAN SECTION;  Surgeon: Newcastle Bing, MD;  Location: [facility] LD ORS;  Service: Obstetrics;  Laterality: N/A;   Family History  Problem Relation Age of Onset   Pulmonary embolism Mother    Hypertension Father    Hypertension Maternal Grandfather    Social History   Tobacco Use   Smoking status: Never   Smokeless tobacco: Never  Vaping Use   Vaping status: Never Used  Substance Use Topics   Alcohol use: No   Drug use: No   No Known Allergies Medications Prior to Admission  Medication Sig Dispense Refill Last  Dose   aspirin 81 MG chewable tablet Chew 162 mg by mouth daily.   08/16/2023   NIFEdipine (ADALAT CC) 60 MG 24 hr tablet Take 60 mg by mouth daily.   08/16/2023   Prenatal Vit-Fe Fumarate-FA (PRENATAL MULTIVITAMIN) TABS tablet Take 1 tablet by mouth daily at 12 noon.   08/16/2023   amLODipine (NORVASC) 10 MG tablet Take 1 tablet (10 mg total) by mouth daily. 90 tablet 3    cephALEXin (KEFLEX) 500 MG capsule Take 1 capsule (500 mg total) by mouth 3 (three) times daily. 30 capsule 0    citalopram (CELEXA) 20 MG tablet Take 1 tablet (20 mg total) by mouth daily. 90 tablet 3    cyclobenzaprine (FLEXERIL) 10 MG tablet Take 1 tablet (10 mg total) by mouth 2 (two) times daily as needed for muscle spasms. 10 tablet 0    I have reviewed patient's Past Medical Hx, Surgical Hx, Family Hx, Social Hx, medications and allergies.   ROS  Pertinent items noted in HPI and remainder of comprehensive ROS otherwise negative.   PHYSICAL EXAM  Patient Vitals for the past 24 hrs:  BP Temp Temp src Pulse Resp SpO2 Height  08/17/23 1320 131/82 -- -- 83 -- 100 % --  08/17/23 1300 130/82 -- -- 86 -- 100 % --  08/17/23 1243 118/80 -- -- 83 -- -- --  08/17/23 1225 105/64 -- -- 85 -- 99 % --  08/17/23 1207 110/67 -- -- (!) 101 -- -- --  08/17/23 1057 (!) 150/108 99.3 F (37.4 C) Oral 97 18 99 % 5\' 3"  (1.6 m)   Constitutional: Well-developed, well-nourished female in no acute distress.  Cardiovascular: normal rate & rhythm, warm and well-perfused Respiratory: normal effort, no problems with respiration noted GI: Abd soft, non-tender, non-distended MS: Extremities nontender, no edema, normal ROM Neurologic: Alert and oriented x 4.  GU: no CVA tenderness Pelvic: NEFG, large red clots noted, weighed to   Labs: Results for orders placed or performed during the hospital encounter of 08/17/23 (from the past 24 hour(s))  CBC with Differential/Platelet     Status: Abnormal   Collection Time: 08/17/23  2:06 PM   Result Value Ref Range   WBC 16.5 (H) 4.0 - 10.5 K/uL   RBC 4.18 3.87 - 5.11 MIL/uL   Hemoglobin 10.6 (L) 12.0 - 15.0 g/dL   HCT 62.9 (L) 52.8 - 41.3 %   MCV 78.9 (L) 80.0 - 100.0 fL   MCH 25.4 (L) 26.0 - 34.0 pg   MCHC 32.1 30.0 - 36.0 g/dL   RDW 24.4 01.0 - 27.2 %   Platelets 309 150 - 400 K/uL   nRBC 0.0 0.0 - 0.2 %   Neutrophils Relative % 86 %   Neutro Abs 14.2 (H) 1.7 - 7.7 K/uL   Lymphocytes Relative 9 %   Lymphs Abs 1.5 0.7 - 4.0 K/uL   Monocytes Relative 5 %   Monocytes Absolute 0.7 0.1 - 1.0 K/uL   Eosinophils Relative 0 %   Eosinophils Absolute 0.0 0.0 - 0.5 K/uL   Basophils Relative 0 %   Basophils Absolute 0.0 0.0 - 0.1 K/uL   Immature Granulocytes 0 %   Abs Immature Granulocytes 0.06 0.00 - 0.07 K/uL   Imaging:  US OB LESS THAN 14 WEEKS WITH OB TRANSVAGINAL  Result Date: 08/17/2023 CLINICAL DATA:  Spontaneous abortion today. Patient passed pregnancy in bathroom. Vaginal bleeding. EXAM: OBSTETRIC <14 WK [place] AND TRANSVAGINAL OB US TECHNIQUE: Both transabdominal and transvaginal ultrasound examinations were performed for complete evaluation of the gestation as well as the maternal uterus, adnexal regions, and pelvic cul-de-sac. Transvaginal technique was performed to assess early pregnancy. COMPARISON:  None Available. FINDINGS: Intrauterine gestational sac: None Maternal uterus/adnexae: Heterogeneous endometrial thickness measures 26 mm. Tiny amount of fluid is seen in the endometrial cavity. Both ovaries are normal in appearance. No mass or abnormal free fluid identified. IMPRESSION: No IUP visualized. Heterogeneous endometrial thickening measuring 26 mm, with tiny amount of fluid seen in endometrial cavity. No evidence of adnexal mass or free fluid. Electronically Signed   By: Danae Orleans M.D.   On: 08/17/2023 12:30    MDM & MAU COURSE  MAU Course: Orders Placed This Encounter  Procedures   US OB LESS THAN 14 WEEKS WITH OB TRANSVAGINAL   CBC with  Differential/Platelet   Meds ordered this encounter  Medications   lactated ringers bolus 1,000 mL   ketorolac (TORADOL) 30 MG/ML injection 30 mg   Stable on arrival to MAU, standing and walking around the room. Sent to ultrasound to assess for retained POCs. On return from U/S, pt reported feeling hot and dizzy, failed orthostatic BP series. Stat CBC ordered but lost in the tube system so redrawn. Hemoglobin down from 13.6>10.6. Remains symptomatic and has passed another of clots.  Called report to Dr. Debroah Loop who came down to  assess the patient and recommeded D&C.   While counseling patient about admission, pt's father in room and divulged that her mother died of a PE after years of repeated DVTs due to Leiden V Factor. While this is her first loss, the close family history warrants investigation prior to 2 additional losses. Will add labs to her admission labs so she can review with her in-network doctor during follow-up.  ASSESSMENT  Incomplete miscarriage  PLAN  Admit for D&C Care turned over to Scheryl Darter, MD.  Edd Arbour, CNM, MSN, [facility] Certified Nurse Midwife, [facility]

## 2023-08-17 NOTE — Progress Notes (Signed)
Dr. Debroah Loop notified Hgb 11.2 post procedure. Patient ok for discharge.

## 2023-08-17 NOTE — Anesthesia Procedure Notes (Signed)
Procedure Name: LMA Insertion Date/Time: 08/17/2023 4:10 PM  Performed by: Darryl Nestle, CRNAPre-anesthesia Checklist: Patient identified, Emergency Drugs available, Suction available and Patient being monitored Patient Re-evaluated:Patient Re-evaluated prior to induction Oxygen Delivery Method: Circle system utilized Preoxygenation: Pre-oxygenation with 100% oxygen Induction Type: IV induction LMA: LMA inserted LMA Size: 3.0 Tube type: Oral Number of attempts: 1 Placement Confirmation: positive ETCO2 and breath sounds checked- equal and bilateral Tube secured with: Tape Dental Injury: Teeth and Oropharynx as per pre-operative assessment

## 2023-08-17 NOTE — MAU Note (Addendum)
Bonnie Long is a 27 y.o. at [redacted]w[redacted]d here in MAU reporting: pt was called back and asked to use the restroom.   Miscarried in restroom,pt taken to rm. Has been getting care in [place]. Just had [place] on 11/12, every thing was ok. No prior bleeding with preg.  Woke up with cramping this morning, started bleeding a little after 10, on the way here.   Onset of complaint: this morning Pain score: was a 9 when she got here, no longer having pain. Vitals:   08/17/23 1057  BP: (!) 150/108  Pulse: 97  Resp: 18  Temp: 99.3 F (37.4 C)  SpO2: 99%     Lab orders placed from triage:

## 2023-08-17 NOTE — MAU Note (Signed)
Attempted orthostatic vitals. Significant drop from lying to sitting. Patient felt too dizzy to stand. I changed saturated pad w/ large clots.

## 2023-08-17 NOTE — Anesthesia Preprocedure Evaluation (Signed)
Anesthesia Evaluation  Patient identified by MRN, date of birth, ID band Patient awake    Reviewed: Allergy & Precautions, H&P , NPO status , Patient's Chart, lab work & pertinent test results  History of Anesthesia Complications (+) PONV and history of anesthetic complications  Airway Mallampati: II   Neck ROM: full    Dental   Pulmonary Patient abstained from smoking.   breath sounds clear to auscultation       Cardiovascular hypertension,  Rhythm:regular Rate:Normal     Neuro/Psych  Headaches  Anxiety        GI/Hepatic   Endo/Other    Class 4 obesity  Renal/GU      Musculoskeletal   Abdominal   Peds  Hematology  (+) Blood dyscrasia, anemia   Anesthesia Other Findings   Reproductive/Obstetrics                             Anesthesia Physical Anesthesia Plan  ASA: 2  Anesthesia Plan: General   Post-op Pain Management:    Induction: Intravenous  PONV Risk Score and Plan: 4 or greater and Propofol infusion, Midazolam, Ondansetron and TIVA  Airway Management Planned: LMA  Additional Equipment:   Intra-op Plan:   Post-operative Plan: Extubation in OR  Informed Consent: I have reviewed the patients History and Physical, chart, labs and discussed the procedure including the risks, benefits and alternatives for the proposed anesthesia with the patient or authorized representative who has indicated his/her understanding and acceptance.     Dental advisory given  Plan Discussed with: CRNA, Anesthesiologist and Surgeon  Anesthesia Plan Comments:        Anesthesia Quick Evaluation

## 2023-08-17 NOTE — Op Note (Signed)
Bonnie Long PROCEDURE DATE: 08/17/2023  PREOPERATIVE DIAGNOSIS: [redacted]w[redacted]d  week missed abortion POSTOPERATIVE DIAGNOSIS: The same PROCEDURE:     Dilation and Evacuation SURGEON:  Scheryl Darter MD  INDICATIONS: 27 y.o. G2P1001 with MAB at [redacted]w[redacted]d weeks gestation, needing surgical completion.  Risks of surgery were discussed with the patient including but not limited to: bleeding which may require transfusion; infection which may require antibiotics; injury to uterus or surrounding organs; need for additional procedures including laparotomy or laparoscopy; possibility of intrauterine scarring which may impair future fertility; and other postoperative/anesthesia complications. Written informed consent was obtained.    FINDINGS:  A 9 week size uterus, moderate amounts of products of conception, specimen sent to pathology.  ANESTHESIA:    Monitored intravenous sedation, paracervical block. INTRAVENOUS FLUIDS:  1000 ml of LR ESTIMATED BLOOD LOSS:  Less than 20 ml. SPECIMENS:  Products of conception sent to pathology COMPLICATIONS:  None immediate.  PROCEDURE DETAILS:  The patient received intravenous Doxycycline while in the preoperative area.  She was then taken to the operating room where monitored intravenous sedation was administered and was found to be adequate.  After an adequate timeout was performed, she was placed in the dorsal lithotomy position and examined; then prepped and draped in the sterile manner.   Her bladder was catheterized for an unmeasured amount of clear, yellow urine. A vaginal speculum was then placed in the patient's vagina and a single tooth tenaculum was applied to the anterior lip of the cervix.  A paracervical block using 10 ml of 0.5% Marcaine was administered. The cervix accommodated a 10 mm suction curette that was gently advanced to the uterine fundus.  The suction device was then activated and curette slowly rotated to clear the uterus of products of conception There  was minimal bleeding noted and the tenaculum removed with good hemostasis noted.   All instruments were removed from the patient's vagina.  Sponge and instrument counts were correct times two  The patient tolerated the procedure well and was taken to the recovery area awake, and in stable condition.  Adam Phenix, MD 08/17/2023 4:39 PM

## 2023-08-18 ENCOUNTER — Encounter (HOSPITAL_COMMUNITY): Payer: Self-pay | Admitting: Obstetrics & Gynecology

## 2023-08-19 LAB — COAG STUDIES INTERP REPORT

## 2023-08-19 LAB — SURGICAL PATHOLOGY

## 2023-08-19 LAB — VON WILLEBRAND PANEL
Coagulation Factor VIII: 246 % — ABNORMAL HIGH (ref 56–140)
Ristocetin Co-factor, Plasma: 156 % (ref 50–200)
Von Willebrand Antigen, Plasma: 283 % — ABNORMAL HIGH (ref 50–200)

## 2023-08-19 NOTE — Anesthesia Postprocedure Evaluation (Signed)
Anesthesia Post Note  Patient: Bonnie Long  Procedure(s) Performed: DILATATION AND EVACUATION     Patient location during evaluation: PACU Anesthesia Type: General Level of consciousness: awake and alert Pain management: pain level controlled Vital Signs Assessment: post-procedure vital signs reviewed and stable Respiratory status: spontaneous breathing, nonlabored ventilation, respiratory function stable and patient connected to nasal cannula oxygen Cardiovascular status: blood pressure returned to baseline and stable Postop Assessment: no apparent nausea or vomiting Anesthetic complications: no   No notable events documented.  Last Vitals:  Vitals:   08/17/23 1745 08/17/23 1800  BP: 123/86 118/84  Pulse: 88 87  Resp: 14 18  Temp:  36.4 C  SpO2: 95% 95%    Last Pain:  Vitals:   08/17/23 1800  TempSrc:   PainSc: 0-No pain                 Reginae Wolfrey S

## 2023-08-20 LAB — ANTIPHOSPHOLIPID SYNDROME EVAL, BLD
Anticardiolipin IgA: <9 U/mL (ref 0–11)
Anticardiolipin IgG: <9 [GPL'U]/mL (ref 0–14)
Anticardiolipin IgM: 10 [MPL'U]/mL (ref 0–12)
DRVVT: 39.9 s (ref 0.0–47.0)
PTT Lupus Anticoagulant: 28.3 s (ref 0.0–43.5)
Phosphatydalserine, IgA: 1 {APS'U} (ref 0–19)
Phosphatydalserine, IgG: 9 U (ref 0–30)
Phosphatydalserine, IgM: <10 U (ref 0–30)

## 2023-08-27 ENCOUNTER — Ambulatory Visit: Payer: BLUE CROSS/BLUE SHIELD | Admitting: Obstetrics and Gynecology

## 2023-08-27 ENCOUNTER — Encounter: Payer: Self-pay | Admitting: Obstetrics and Gynecology

## 2023-08-27 VITALS — BP 125/89 | HR 87 | Wt 258.6 lb

## 2023-08-27 DIAGNOSIS — O039 Complete or unspecified spontaneous abortion without complication: Secondary | ICD-10-CM

## 2023-08-27 DIAGNOSIS — D6859 Other primary thrombophilia: Secondary | ICD-10-CM

## 2023-08-27 DIAGNOSIS — Z5189 Encounter for other specified aftercare: Secondary | ICD-10-CM

## 2023-08-27 NOTE — Progress Notes (Signed)
Obstetrics and Gynecology Visit Return Patient Evaluation  Appointment Date: 08/27/2023  Primary Care Provider: Corwin Levins  OBGYN Clinic: [facility]  Chief Complaint: follow up d&c for incomplete SAB on 11/17  History of Present Illness:  Bonnie Long is a 27 y.o. still having some spotting but no other issues. Final pathology negative (only CV seen )  Review of Systems: as noted in the History of Present Illness.  Patient Active Problem List   Diagnosis Date Noted   Vitamin D deficiency 04/10/2020   Hypertension 02/11/2018   Obesity (BMI 30-39.9) 01/21/2018   Medications:  [name] had no medications administered during this visit. Current Outpatient Medications  Medication Sig Dispense Refill   amLODipine (NORVASC) 10 MG tablet Take 1 tablet (10 mg total) by mouth daily. 90 tablet 3   Prenatal Vit-Fe Fumarate-FA (PRENATAL MULTIVITAMIN) TABS tablet Take 1 tablet by mouth daily at 12 noon.     No current facility-administered medications for this visit.    Allergies: has No Known Allergies.  Physical Exam:  BP (!) 143/97   Pulse 91   Wt 258 lb 9.6 oz (117.3 kg)   LMP 03/03/2023 (Exact Date)   BMI 45.81 kg/m  Body mass index is 45.81 kg/m. 5 minute repeat 125/89 General appearance: Well nourished, well developed female in no acute distress.  Neuro/Psych:  Normal mood and affect.     Assessment: patient stable  Plan:  1. Follow-up visit after miscarriage Doing well post op. Recommend pelvic rest until December. Periods qmonth prior to most recent pregnancy and I told her if no period by late December to contact us. She's unsure if she wants to try again. Reassess for this and any contraceptive needs in 3 months when due for annual and pap smear  2. Elevated factor 8 Work up done in MAU. Slightly elevated and FVL panel pending. Recommend repeat factor 8 at 3 month visit as results may be skewed due to  pregnancy   Return in about 3 months (around 11/27/2023) for in person, md or app.  No future appointments.  Cornelia Copa MD Attending [facility]wife)

## 2023-08-27 NOTE — Progress Notes (Signed)
Follow up D & C  08/17/23

## 2023-09-01 LAB — FACTOR V LEIDEN

## 2023-09-04 ENCOUNTER — Telehealth: Payer: BLUE CROSS/BLUE SHIELD

## 2023-09-04 ENCOUNTER — Encounter (HOSPITAL_COMMUNITY): Payer: Self-pay | Admitting: Obstetrics & Gynecology

## 2023-09-04 ENCOUNTER — Other Ambulatory Visit: Payer: Self-pay

## 2023-09-04 ENCOUNTER — Ambulatory Visit (INDEPENDENT_AMBULATORY_CARE_PROVIDER_SITE_OTHER): Payer: Managed Care, Other (non HMO) | Admitting: Obstetrics and Gynecology

## 2023-09-04 ENCOUNTER — Inpatient Hospital Stay (HOSPITAL_COMMUNITY)
Admission: AD | Admit: 2023-09-04 | Discharge: 2023-09-04 | Disposition: A | Discharge status: Home / Self Care | Payer: Medicaid Other | Attending: Obstetrics & Gynecology | Admitting: Obstetrics & Gynecology

## 2023-09-04 ENCOUNTER — Encounter: Payer: Self-pay | Admitting: Obstetrics and Gynecology

## 2023-09-04 ENCOUNTER — Inpatient Hospital Stay (HOSPITAL_COMMUNITY): Payer: Self-pay

## 2023-09-04 VITALS — BP 158/86 | HR 97 | Wt 258.0 lb

## 2023-09-04 DIAGNOSIS — Z8249 Family history of ischemic heart disease and other diseases of the circulatory system: Secondary | ICD-10-CM

## 2023-09-04 DIAGNOSIS — I2699 Other pulmonary embolism without acute cor pulmonale: Secondary | ICD-10-CM | POA: Insufficient documentation

## 2023-09-04 DIAGNOSIS — O037 Embolism following complete or unspecified spontaneous abortion: Secondary | ICD-10-CM | POA: Diagnosis not present

## 2023-09-04 DIAGNOSIS — R7989 Other specified abnormal findings of blood chemistry: Secondary | ICD-10-CM

## 2023-09-04 DIAGNOSIS — R5383 Other fatigue: Secondary | ICD-10-CM | POA: Diagnosis not present

## 2023-09-04 DIAGNOSIS — Z3201 Encounter for pregnancy test, result positive: Secondary | ICD-10-CM | POA: Insufficient documentation

## 2023-09-04 DIAGNOSIS — D649 Anemia, unspecified: Secondary | ICD-10-CM | POA: Insufficient documentation

## 2023-09-04 LAB — COMPREHENSIVE METABOLIC PANEL
ALT: 16 U/L (ref 0–44)
AST: 17 U/L (ref 15–41)
Albumin: 3.6 g/dL (ref 3.5–5.0)
Alkaline Phosphatase: 54 U/L (ref 38–126)
Anion gap: 9 (ref 5–15)
BUN: 13 mg/dL (ref 6–20)
CO2: 24 mmol/L (ref 22–32)
Calcium: 8.8 mg/dL — ABNORMAL LOW (ref 8.9–10.3)
Chloride: 102 mmol/L (ref 98–111)
Creatinine, Ser: 1.14 mg/dL — ABNORMAL HIGH (ref 0.44–1.00)
GFR, Estimated: >60 mL/min (ref >60)
Glucose, Bld: 83 mg/dL (ref 70–99)
Potassium: 3.5 mmol/L (ref 3.5–5.1)
Sodium: 135 mmol/L (ref 135–145)
Total Bilirubin: 0.5 mg/dL (ref <1.2)
Total Protein: 7.1 g/dL (ref 6.5–8.1)

## 2023-09-04 LAB — CBC WITH DIFFERENTIAL/PLATELET
Abs Immature Granulocytes: 0.01 10*3/uL (ref 0.00–0.07)
Basophils Absolute: 0 10*3/uL (ref 0.0–0.1)
Basophils Relative: 0 %
Eosinophils Absolute: 0.1 10*3/uL (ref 0.0–0.5)
Eosinophils Relative: 1 %
HCT: 31.3 % — ABNORMAL LOW (ref 36.0–46.0)
Hemoglobin: 10 g/dL — ABNORMAL LOW (ref 12.0–15.0)
Immature Granulocytes: 0 %
Lymphocytes Relative: 35 %
Lymphs Abs: 2.3 10*3/uL (ref 0.7–4.0)
MCH: 25.6 pg — ABNORMAL LOW (ref 26.0–34.0)
MCHC: 31.9 g/dL (ref 30.0–36.0)
MCV: 80.1 fL (ref 80.0–100.0)
Monocytes Absolute: 0.6 10*3/uL (ref 0.1–1.0)
Monocytes Relative: 8 %
Neutro Abs: 3.7 10*3/uL (ref 1.7–7.7)
Neutrophils Relative %: 56 %
Platelets: 402 10*3/uL — ABNORMAL HIGH (ref 150–400)
RBC: 3.91 MIL/uL (ref 3.87–5.11)
RDW: 14.1 % (ref 11.5–15.5)
WBC: 6.7 10*3/uL (ref 4.0–10.5)
nRBC: 0 % (ref 0.0–0.2)

## 2023-09-04 LAB — D-DIMER, QUANTITATIVE: D-Dimer, Quant: <0.27 ug{FEU}/mL (ref 0.00–0.50)

## 2023-09-04 LAB — HCG, QUANTITATIVE, PREGNANCY: hCG, Beta Chain, Quant, S: 147 m[IU]/mL — ABNORMAL HIGH (ref <5)

## 2023-09-04 LAB — POCT URINE PREGNANCY: Preg Test, Ur: POSITIVE — AB

## 2023-09-04 MED ORDER — FERROUS SULFATE 325 (65 FE) MG PO TABS
325.0000 mg | ORAL_TABLET | ORAL | Status: DC
  Filled 2023-09-04: qty 1

## 2023-09-04 MED ORDER — IOHEXOL 350 MG/ML SOLN
65.0000 mL | Freq: Once | INTRAVENOUS | Status: AC | PRN
  Administered 2023-09-04: 65 mL via INTRAVENOUS

## 2023-09-04 NOTE — Progress Notes (Signed)
Pt here for problem visit today.  Consent to student in exam room  CC: Since D&C with activity pt reports she gets lightheaded /fatigue states episodes only last a few mins  Denies any bleeding or pain.   UPT: Positive.

## 2023-09-04 NOTE — MAU Provider Note (Addendum)
History of Present Illness The patient, who recently underwent a D&C following a miscarriage, presents with fatigue, lightheadedness, and shortness of breath. These symptoms are particularly noticeable when the patient is exercising or going up and down stairs. The patient reports that these symptoms have been ongoing since the [facility] procedure. The patient also mentions a persistent soreness in the arm where an IV was inserted during the D&C. The patient's mother had a history of blood clots, raising concerns about a possible hereditary predisposition. The patient denies any current pain or swelling in the legs.  Dr. Vergie Living saw the patient in the office this morning and was concerned about the possibility of PE due to shortness of breath and family history of mother dying of PE.  Patient also had borderline tachycardia in the 90s.  Physical Exam Patient Vitals for the past 24 hrs:  BP Temp Temp src Pulse Resp SpO2 Height Weight  09/04/23 1701 138/88 -- -- 89 -- -- -- --  09/04/23 1652 (!) 136/94 98.7 F (37.1 C) Oral 87 18 100 % -- --  09/04/23 1647 -- -- -- -- -- -- 5\' 3"  (1.6 m) 117.5 kg   General: No apparent distress. SKIN: No cyanosis, pallor or diaphoresis.  No redness or lumps palpated at the site of recent IV insertion. EXTREMITIES: No edema or tenderness in calves. No palpable cords. Heart: Regular rate and rhythm Lungs: Clear to auscultation bilaterally.  Normal rate and effort. Abdomen: Deferred Pelvic: Deferred  Results LABS Factor V Leiden: Negative von Willebrand factor: Elevated Pregnancy test: Positive Results for orders placed or performed during the hospital encounter of 09/04/23 (from the past 24 hour(s))  CBC with Differential/Platelet     Status: Abnormal   Collection Time: 09/04/23  5:26 PM  Result Value Ref Range   WBC 6.7 4.0 - 10.5 K/uL   RBC 3.91 3.87 - 5.11 MIL/uL   Hemoglobin 10.0 (L) 12.0 - 15.0 g/dL   HCT 40.9 (L) 81.1 - 91.4 %   MCV 80.1 80.0 - 100.0 fL    MCH 25.6 (L) 26.0 - 34.0 pg   MCHC 31.9 30.0 - 36.0 g/dL   RDW 78.2 95.6 - 21.3 %   Platelets 402 (H) 150 - 400 K/uL   nRBC 0.0 0.0 - 0.2 %   Neutrophils Relative % 56 %   Neutro Abs 3.7 1.7 - 7.7 K/uL   Lymphocytes Relative 35 %   Lymphs Abs 2.3 0.7 - 4.0 K/uL   Monocytes Relative 8 %   Monocytes Absolute 0.6 0.1 - 1.0 K/uL   Eosinophils Relative 1 %   Eosinophils Absolute 0.1 0.0 - 0.5 K/uL   Basophils Relative 0 %   Basophils Absolute 0.0 0.0 - 0.1 K/uL   Immature Granulocytes 0 %   Abs Immature Granulocytes 0.01 0.00 - 0.07 K/uL  D-dimer, quantitative     Status: None   Collection Time: 09/04/23  5:26 PM  Result Value Ref Range   D-Dimer, Quant <0.27 0.00 - 0.50 ug/mL-FEU  hCG, quantitative, pregnancy     Status: Abnormal   Collection Time: 09/04/23  5:26 PM  Result Value Ref Range   hCG, Beta Chain, Quant, S 147 (H) <5 mIU/mL  Comprehensive metabolic panel     Status: Abnormal   Collection Time: 09/04/23  5:26 PM  Result Value Ref Range   Sodium 135 135 - 145 mmol/L   Potassium 3.5 3.5 - 5.1 mmol/L   Chloride 102 98 - 111 mmol/L   CO2 24  22 - 32 mmol/L   Glucose, Bld 83 70 - 99 mg/dL   BUN 13 6 - 20 mg/dL   Creatinine, Ser 8.46 (H) 0.44 - 1.00 mg/dL   Calcium 8.8 (L) 8.9 - 10.3 mg/dL   Total Protein 7.1 6.5 - 8.1 g/dL   Albumin 3.6 3.5 - 5.0 g/dL   AST 17 15 - 41 U/L   ALT 16 0 - 44 U/L   Alkaline Phosphatase 54 38 - 126 U/L   Total Bilirubin 0.5 <1.2 mg/dL   GFR, Estimated >96 >29 mL/min   Anion gap 9 5 - 15     Assessment & Plan Post-Miscarriage Anemia Patient reports fatigue and lightheadedness, particularly with exertion, following a D&C for a miscarriage. No significant bleeding since the procedure. -Check for anemia today.  Possible Pulmonary Embolism Given the patient's symptoms and family history of pulmonary embolism, there is a concern for possible blood clot in the lungs. -Order a CT scan to check for pulmonary embolism. -Perform an EKG to  rule out cardiac causes of symptoms. -Order a D-dimer test, acknowledging its limitations in the context of recent pregnancy.  Possible New Pregnancy Patient has resumed intercourse following miscarriage and still has a positive pregnancy test, which could be residual from the miscarriage or indicative of a new pregnancy. -Check quantitative HCG levels to help differentiate between residual HCG from miscarriage and a new pregnancy.  Possible von Willebrand's Disease Patient has a history of heavy bleeding, including postpartum hemorrhage and heavy bleeding with miscarriage. One lab test was mildly abnormal but not indicative of a clotting disorder. -Plan to retest for von Willebrand's disease at least six weeks post-pregnancy.  Follow-up Depending on the results of the above tests, further follow-up may be needed to manage anemia, treat a possible pulmonary embolism, confirm a new pregnancy, or further evaluate for von Willebrand's disease.  Care of patient turned over to [facility] at 8:25 p.m.  Dorathy Kinsman, CNM 09/04/2023 8:01 PM  Assumed care from CNM Smith at 8:25pm.  - PE concerns = Ddimer negative less likely, CT pending - EKG NSR - Infectious = no leukocytosis, Hgb 10.0, plts 400s likely hemoconcentration given Cr 1.14 however baseline appears to be 1.19 - not uremic in s/o ?CKD given BUN 13 - Hcg plan to have OP Hcg plan to see if up trending  CTPE = no acute intrathoracic pathology, no PE.   Will start on PO iron supplementation. Spoke with Attending Dr. Macon Large who agrees with the plan as above. Appt made for Hcg repeat on 09/08/23 at 9am. Message sent to try and add patient to provider schedule as well. Stable for D/C.    Mittie Bodo, MD Family Medicine - Obstetrics Fellow

## 2023-09-04 NOTE — Progress Notes (Signed)
.   History:  [name] is a 27 y.o. G2P1011 who presents to clinic today for fatigue, weakness and shortness of breath following a D &C procedure secondary to a spontaneous abortion on 11/17. Patient seen on 11/27 for routine post op visit and was doing well w/o issue  Patient states she has been feeling continuously fatigued and weak and is unable to stand for long periods of time ever since the 11/17 surgery. She did not experience these symptoms prior to the procedure.  She has  a maternal history of multiple DVTs and died from a PE, so coagulation studies were conducted in the MAU when she was miscarrying and she had an elevated factor 8.   Patient has not had a frank period since procedure and only reports very light spotting. Has had intercourse (first time 2 weeks ago) since the procedure and is not currently on any birth control.  The following portions of the patient's history were reviewed and updated as appropriate: allergies, current medications, family history, past medical history, social history, past surgical history and problem list.  Review of Systems:  Review of Systems  Constitutional:  Positive for malaise/fatigue. Negative for chills and fever.  Eyes:  Negative for blurred vision.  Respiratory:  Positive for shortness of breath. Negative for cough and hemoptysis.   Cardiovascular:  Negative for chest pain, palpitations, orthopnea, claudication and leg swelling.  Gastrointestinal:  Negative for abdominal pain, blood in stool, heartburn, nausea and vomiting.  Genitourinary:  Negative for hematuria.  Neurological:  Positive for weakness.      Objective:  Physical Exam BP (!) 158/86   Pulse 97   Wt 258 lb (117 kg)   LMP 03/03/2023 (Exact Date)   BMI 45.70 kg/m  Physical Exam Constitutional:      Appearance: She is obese.  HENT:     Head: Normocephalic.  Cardiovascular:     Rate and Rhythm: Normal rate and regular rhythm.     Pulses: Normal pulses.   Pulmonary:     Effort: Pulmonary effort is normal.     Breath sounds: Normal breath sounds.  Abdominal:     Palpations: Abdomen is soft.  Neurological:     Mental Status: She is alert.   LE: no c/c/e  Labs and Imaging UPT: negative  Labs, imaging and previous visits in Epic and Care Everywhere reviewed  Assessment & Plan:  1. Weakness and Fatigue  Family history of pulmonary embolism In the context of significant family history and hypercoagulable state following pregnancy and surgical intervention, sent patient to MAU for further evaluation of symptoms and rule out of thromboembolism. Also recommend assessing for anemia and thyroid dysfunction and + UPT which could be down trending from d&c; unsure of how far along patient was when she miscarried and no hcg quants done.   MAU called   RTC: PRN  Grant-Valkaria Bing, MD 09/04/2023 10:24 AM

## 2023-09-04 NOTE — MAU Note (Signed)
Bonnie Long is a 27 y.o. at [redacted]w[redacted]d here in MAU reporting: since D&C 2 weeks ago she's  been getting light headed and fatigued with activity. S/P D&C 08/17/2023 LMP: NA Onset of complaint: 2 weeks  Pain score: 0 Vitals:   09/04/23 1652  BP: (!) 136/94  Pulse: 87  Resp: 18  Temp: 98.7 F (37.1 C)  SpO2: 100%     FHT:NA Lab orders placed from triage:   None

## 2023-09-05 ENCOUNTER — Encounter: Payer: Self-pay | Admitting: Obstetrics and Gynecology

## 2023-09-05 DIAGNOSIS — I10 Essential (primary) hypertension: Secondary | ICD-10-CM

## 2023-09-05 DIAGNOSIS — D508 Other iron deficiency anemias: Secondary | ICD-10-CM

## 2023-09-08 ENCOUNTER — Ambulatory Visit: Payer: BLUE CROSS/BLUE SHIELD

## 2023-09-08 ENCOUNTER — Other Ambulatory Visit: Payer: BLUE CROSS/BLUE SHIELD

## 2023-09-08 DIAGNOSIS — R7989 Other specified abnormal findings of blood chemistry: Secondary | ICD-10-CM

## 2023-09-08 MED ORDER — FERROUS SULFATE 324 (65 FE) MG PO TBEC: 1.0000 | DELAYED_RELEASE_TABLET | ORAL | 2 refills | Status: DC

## 2023-09-08 MED ORDER — AMLODIPINE BESYLATE 10 MG PO TABS: 10.0000 mg | ORAL_TABLET | Freq: Every day | ORAL | 0 refills | Status: DC

## 2023-09-09 LAB — BETA HCG QUANT (REF LAB): hCG Quant: 78 m[IU]/mL

## 2023-09-19 ENCOUNTER — Other Ambulatory Visit: Payer: Self-pay

## 2023-09-19 DIAGNOSIS — R7989 Other specified abnormal findings of blood chemistry: Secondary | ICD-10-CM

## 2023-09-20 LAB — BETA HCG QUANT (REF LAB): hCG Quant: 13 m[IU]/mL

## 2023-09-30 ENCOUNTER — Other Ambulatory Visit: Payer: Self-pay

## 2023-09-30 DIAGNOSIS — O039 Complete or unspecified spontaneous abortion without complication: Secondary | ICD-10-CM

## 2023-10-01 LAB — BETA HCG QUANT (REF LAB): hCG Quant: 9 m[IU]/mL

## 2023-10-07 ENCOUNTER — Encounter: Payer: Self-pay | Admitting: Internal Medicine

## 2023-10-07 ENCOUNTER — Ambulatory Visit (INDEPENDENT_AMBULATORY_CARE_PROVIDER_SITE_OTHER): Payer: Managed Care, Other (non HMO) | Admitting: Internal Medicine

## 2023-10-07 VITALS — BP 130/84 | HR 80 | Temp 98.6°F | Ht 63.0 in | Wt 257.0 lb

## 2023-10-07 DIAGNOSIS — D509 Iron deficiency anemia, unspecified: Secondary | ICD-10-CM | POA: Diagnosis not present

## 2023-10-07 DIAGNOSIS — E538 Deficiency of other specified B group vitamins: Secondary | ICD-10-CM

## 2023-10-07 DIAGNOSIS — Z0000 Encounter for general adult medical examination without abnormal findings: Secondary | ICD-10-CM | POA: Diagnosis not present

## 2023-10-07 DIAGNOSIS — I10 Essential (primary) hypertension: Secondary | ICD-10-CM | POA: Diagnosis not present

## 2023-10-07 DIAGNOSIS — Z0001 Encounter for general adult medical examination with abnormal findings: Secondary | ICD-10-CM | POA: Insufficient documentation

## 2023-10-07 DIAGNOSIS — E559 Vitamin D deficiency, unspecified: Secondary | ICD-10-CM | POA: Diagnosis not present

## 2023-10-07 DIAGNOSIS — Z862 Personal history of diseases of the blood and blood-forming organs and certain disorders involving the immune mechanism: Secondary | ICD-10-CM | POA: Insufficient documentation

## 2023-10-07 DIAGNOSIS — R7303 Prediabetes: Secondary | ICD-10-CM | POA: Diagnosis not present

## 2023-10-07 LAB — TSH: TSH: 2.34 u[IU]/mL (ref 0.35–5.50)

## 2023-10-07 LAB — CBC WITH DIFFERENTIAL/PLATELET
Basophils Absolute: 0 10*3/uL (ref 0.0–0.1)
Basophils Relative: 0.6 % (ref 0.0–3.0)
Eosinophils Absolute: 0.1 10*3/uL (ref 0.0–0.7)
Eosinophils Relative: 0.9 % (ref 0.0–5.0)
HCT: 35.8 % — ABNORMAL LOW (ref 36.0–46.0)
Hemoglobin: 11.3 g/dL — ABNORMAL LOW (ref 12.0–15.0)
Lymphocytes Relative: 26.1 % (ref 12.0–46.0)
Lymphs Abs: 2.1 10*3/uL (ref 0.7–4.0)
MCHC: 31.7 g/dL (ref 30.0–36.0)
MCV: 78.2 fL (ref 78.0–100.0)
Monocytes Absolute: 0.5 10*3/uL (ref 0.1–1.0)
Monocytes Relative: 6 % (ref 3.0–12.0)
Neutro Abs: 5.3 10*3/uL (ref 1.4–7.7)
Neutrophils Relative %: 66.4 % (ref 43.0–77.0)
Platelets: 475 10*3/uL — ABNORMAL HIGH (ref 150.0–400.0)
RBC: 4.58 Mil/uL (ref 3.87–5.11)
RDW: 13.9 % (ref 11.5–15.5)
WBC: 8 10*3/uL (ref 4.0–10.5)

## 2023-10-07 LAB — LIPID PANEL
Cholesterol: 166 mg/dL (ref 0–200)
HDL: 44.3 mg/dL (ref >39.00)
LDL Cholesterol: 100 mg/dL — ABNORMAL HIGH (ref 0–99)
NonHDL: 121.54
Total CHOL/HDL Ratio: 4
Triglycerides: 108 mg/dL (ref 0.0–149.0)
VLDL: 21.6 mg/dL (ref 0.0–40.0)

## 2023-10-07 LAB — BASIC METABOLIC PANEL
BUN: 15 mg/dL (ref 6–23)
CO2: 30 meq/L (ref 19–32)
Calcium: 9.2 mg/dL (ref 8.4–10.5)
Chloride: 101 meq/L (ref 96–112)
Creatinine, Ser: 1.05 mg/dL (ref 0.40–1.20)
GFR: 73.59 mL/min (ref >60.00)
Glucose, Bld: 91 mg/dL (ref 70–99)
Potassium: 4 meq/L (ref 3.5–5.1)
Sodium: 138 meq/L (ref 135–145)

## 2023-10-07 LAB — URINALYSIS, ROUTINE W REFLEX MICROSCOPIC
Bilirubin Urine: NEGATIVE
Hgb urine dipstick: NEGATIVE
Ketones, ur: NEGATIVE
Leukocytes,Ua: NEGATIVE
Nitrite: NEGATIVE
Specific Gravity, Urine: 1.02 (ref 1.000–1.030)
Total Protein, Urine: NEGATIVE
Urine Glucose: NEGATIVE
Urobilinogen, UA: 0.2 (ref 0.0–1.0)
pH: 6.5 (ref 5.0–8.0)

## 2023-10-07 LAB — HEPATIC FUNCTION PANEL
ALT: 12 U/L (ref 0–35)
AST: 17 U/L (ref 0–37)
Albumin: 4.3 g/dL (ref 3.5–5.2)
Alkaline Phosphatase: 66 U/L (ref 39–117)
Bilirubin, Direct: 0.1 mg/dL (ref 0.0–0.3)
Total Bilirubin: 0.3 mg/dL (ref 0.2–1.2)
Total Protein: 8 g/dL (ref 6.0–8.3)

## 2023-10-07 LAB — FERRITIN: Ferritin: 6.4 ng/mL — ABNORMAL LOW (ref 10.0–291.0)

## 2023-10-07 LAB — IBC PANEL
Iron: 25 ug/dL — ABNORMAL LOW (ref 42–145)
Saturation Ratios: 5.1 % — ABNORMAL LOW (ref 20.0–50.0)
TIBC: 490 ug/dL — ABNORMAL HIGH (ref 250.0–450.0)
Transferrin: 350 mg/dL (ref 212.0–360.0)

## 2023-10-07 LAB — HEMOGLOBIN A1C: Hgb A1c MFr Bld: 5.6 % (ref 4.6–6.5)

## 2023-10-07 LAB — VITAMIN B12: Vitamin B-12: 516 pg/mL (ref 211–911)

## 2023-10-07 LAB — VITAMIN D 25 HYDROXY (VIT D DEFICIENCY, FRACTURES): VITD: 30.2 ng/mL (ref 30.00–100.00)

## 2023-10-07 NOTE — Patient Instructions (Signed)

## 2023-10-07 NOTE — Progress Notes (Signed)
 Chief Complaint:: wellness exam and iron  deficiency anemia, ongoing vaginal bleeding post partum, htn, low vit d       HPI:  Bonnie Long is a 28 y.o. female here for wellness exam; declines flu and covid booster, o/w up to date                        Also has mild intermittent vaginal bleeding post partum x 4 mo , has seen GYN and following closely, now on iron  sulfate 1 every day and tolerating well.  Pt denies chest pain, increased sob or doe, wheezing, orthopnea, PND, increased LE swelling, palpitations, dizziness or syncope.   Pt denies polydipsia, polyuria, or new focal neuro s/s.    Pt denies fever, wt loss, night sweats, loss of appetite, or other constitutional symptoms  Has next f/u gyn jan 20.     Wt Readings from Last 3 Encounters:  10/07/23 257 lb (116.6 kg)  09/04/23 259 lb 1.6 oz (117.5 kg)  09/04/23 258 lb (117 kg)   BP Readings from Last 3 Encounters:  10/07/23 130/84  09/04/23 (!) 141/96  09/04/23 (!) 158/86   Immunization History  Administered Date(s) Administered   DTaP 11/24/1997, 01/23/1998, 03/27/1998, 12/11/1998   HIB (PRP-OMP) 11/24/1997, 01/23/1998, 03/27/1998, 10/03/1998   HPV Quadrivalent 12/28/2015   Hep B, Unspecified Aug 27, 1998, 11/24/1997, 03/27/1998   Hepatitis A, Ped/Adol-2 Dose 11/10/2015   IPV 11/24/1997, 01/23/1998, 03/27/1998, 11/10/2015   Influenza Nasal 07/18/2008   Influenza,inj,Quad PF,6+ Mos 08/13/2022   Influenza,inj,quad, With Preservative 11/10/2015   Influenza-Unspecified 10/31/2020   MMR 10/03/1998, 11/11/2001   Meningococcal Conjugate 01/18/2016   PFIZER(Purple Top)SARS-COV-2 Vaccination 03/30/2020, 04/27/2020   PPD Test 10/02/2021   Tdap 06/13/2009, 03/01/2021   Varicella 10/03/1998, 11/10/2015   Health Maintenance Due  Topic Date Due   HPV VACCINES (2 - 3-dose series) 01/25/2016      Past Medical History:  Diagnosis Date   Anxiety 02/25/2023   Blood transfusion without reported diagnosis    Chronic  hypertension with superimposed preeclampsia 05/03/2021   Hypertension    Migraine 04/10/2020   Pregnancy induced hypertension    Severe pre-eclampsia 05/06/2021   UTI (urinary tract infection)    Past Surgical History:  Procedure Laterality Date   CESAREAN SECTION N/A 05/06/2021   Procedure: CESAREAN SECTION;  Surgeon: Izell Harari, MD;  Location: [facility];  Service: Obstetrics;  Laterality: N/A;   DILATION AND EVACUATION N/A 08/17/2023   Procedure: DILATATION AND EVACUATION;  Surgeon: Eveline Lynwood MATSU, MD;  Location: [facility] OR;  Service: Gynecology;  Laterality: N/A;    reports that she has never smoked. She has never used smokeless tobacco. She reports that she does not drink alcohol and does not use drugs. family history includes Factor V Leiden deficiency in her mother; Hypertension in her father and maternal grandfather; Pulmonary embolism in her mother. No Known Allergies Current Outpatient Medications on File Prior to Visit  Medication Sig Dispense Refill   amLODipine  (NORVASC ) 10 MG tablet Take 1 tablet (10 mg total) by mouth daily. 90 tablet 0   ferrous sulfate  324 (65 Fe) MG TBEC Take 1 tablet (325 mg total) by mouth every other day. 30 tablet 2   Prenatal Vit-Fe Fumarate-FA (PRENATAL MULTIVITAMIN) TABS tablet Take 1 tablet by mouth daily at 12 noon.     No current facility-administered medications on file prior to visit.        ROS:  All others  reviewed and negative.  Objective        PE:  BP 130/84 (BP Location: Right Arm, Patient Position: Sitting, Cuff Size: Normal)   Pulse 80   Temp 98.6 F (37 C) (Oral)   Ht 5' 3 (1.6 m)   Wt 257 lb (116.6 kg)   LMP  (LMP Unknown)   SpO2 98%   Breastfeeding No   BMI 45.53 kg/m                 Constitutional: Pt appears in NAD               HENT: Head: NCAT.                Right Ear: External ear normal.                 Left Ear: External ear normal.                Eyes: . Pupils are equal, round, and reactive to light.  Conjunctivae and EOM are normal               Nose: without d/c or deformity               Neck: Neck supple. Gross normal ROM               Cardiovascular: Normal rate and regular rhythm.                 Pulmonary/Chest: Effort normal and breath sounds without rales or wheezing.                Abd:  Soft, NT, ND, + BS, no organomegaly               Neurological: Pt is alert. At baseline orientation, motor grossly intact               Skin: Skin is warm. No rashes, no other new lesions, LE edema - none               Psychiatric: Pt behavior is normal without agitation   Micro: none  Cardiac tracings I have personally interpreted today:  none  Pertinent Radiological findings (summarize): none   Lab Results  Component Value Date   WBC 8.0 10/07/2023   HGB 11.3 (L) 10/07/2023   HCT 35.8 (L) 10/07/2023   PLT 475.0 (H) 10/07/2023   GLUCOSE 91 10/07/2023   CHOL 166 10/07/2023   TRIG 108.0 10/07/2023   HDL 44.30 10/07/2023   LDLCALC 100 (H) 10/07/2023   ALT 12 10/07/2023   AST 17 10/07/2023   NA 138 10/07/2023   K 4.0 10/07/2023   CL 101 10/07/2023   CREATININE 1.05 10/07/2023   BUN 15 10/07/2023   CO2 30 10/07/2023   TSH 2.34 10/07/2023   HGBA1C 5.6 10/07/2023   Assessment/Plan:  Bonnie Long is a 28 y.o. Black or African American [2] female with  has a past medical history of Anxiety (02/25/2023), Blood transfusion without reported diagnosis, Chronic hypertension with superimposed preeclampsia (05/03/2021), Hypertension, Migraine (04/10/2020), Pregnancy induced hypertension, Severe pre-eclampsia (05/06/2021), and UTI (urinary tract infection).  Encounter for well adult exam with abnormal findings Age and sex appropriate education and counseling updated with regular exercise and diet Referrals for preventative services - none needed Immunizations addressed - declines flu and covid booster Smoking counseling  - none needed Evidence for depression or other mood disorder -  none significant Most recent labs reviewed.  I have personally reviewed and have noted: 1) the patient's medical and social history 2) The patient's current medications and supplements 3) The patient's height, weight, and BMI have been recorded in the chart   Hypertension BP Readings from Last 3 Encounters:  10/07/23 130/84  09/04/23 (!) 141/96  09/04/23 (!) 158/86   Stable, pt to continue medical treatment norvasc  10 d,    Vitamin D  deficiency Last vitamin D  Lab Results  Component Value Date   VD25OH 30.20 10/07/2023   Low to start oral replacement   Iron  deficiency anemia Also for f/u lab today, consider iron  infusion if worsening  Prediabetes Lab Results  Component Value Date   HGBA1C 5.6 10/07/2023   Stable, pt to continue current medical treatment  - diet,wt control   Followup: Return in about 6 months (around 04/05/2024).  Lynwood Rush, MD 10/08/2023 9:09 PM [facility]

## 2023-10-08 ENCOUNTER — Encounter: Payer: Self-pay | Admitting: Internal Medicine

## 2023-10-08 ENCOUNTER — Other Ambulatory Visit: Payer: Self-pay | Admitting: Internal Medicine

## 2023-10-08 ENCOUNTER — Telehealth: Payer: Self-pay | Admitting: Pharmacy Technician

## 2023-10-08 DIAGNOSIS — R7303 Prediabetes: Secondary | ICD-10-CM

## 2023-10-08 HISTORY — DX: Prediabetes: R73.03

## 2023-10-08 NOTE — Assessment & Plan Note (Signed)
 BP Readings from Last 3 Encounters:  10/07/23 130/84  09/04/23 (!) 141/96  09/04/23 (!) 158/86   Stable, pt to continue medical treatment norvasc 10 d,

## 2023-10-08 NOTE — Assessment & Plan Note (Signed)
 Also for f/u lab today, consider iron infusion if worsening

## 2023-10-08 NOTE — Assessment & Plan Note (Signed)
 Last vitamin D Lab Results  Component Value Date   VD25OH 30.20 10/07/2023   Low to start oral replacement

## 2023-10-08 NOTE — Assessment & Plan Note (Signed)
Age and sex appropriate education and counseling updated with regular exercise and diet Referrals for preventative services - none needed Immunizations addressed - declines flu and covid booster Smoking counseling  - none needed Evidence for depression or other mood disorder - none significant Most recent labs reviewed. I have personally reviewed and have noted: 1) the patient's medical and social history 2) The patient's current medications and supplements 3) The patient's height, weight, and BMI have been recorded in the chart

## 2023-10-08 NOTE — Telephone Encounter (Signed)
 Dr. Norleen,  Allegra is non preferred and will be denied if patient has not failed preferred medication. Preferred medication is Venofer . Would you like to try Venofer ?   Auth Submission: NO AUTH NEEDED Site of care: Site of care: [facility] Payer: [employer] Medication & CPT/J Code(s) submitted: Venofer  (Iron  Sucrose) J1756 Route of submission (phone, fax, portal):  Phone # Fax # Auth type: Buy/Bill PB Units/visits requested:  Reference number:  Approval from: 10/08/23 to 02/05/24

## 2023-10-08 NOTE — Assessment & Plan Note (Signed)
 Lab Results  Component Value Date   HGBA1C 5.6 10/07/2023   Stable, pt to continue current medical treatment  - diet,wt control

## 2023-10-09 ENCOUNTER — Encounter: Payer: Self-pay | Admitting: Internal Medicine

## 2023-10-09 NOTE — Addendum Note (Signed)
 Addended by: Corwin Levins on: 10/09/2023 08:16 PM   Modules accepted: Orders

## 2023-10-09 NOTE — Telephone Encounter (Signed)
Ok this was changed to [employer]

## 2023-10-10 ENCOUNTER — Telehealth: Payer: Self-pay

## 2023-10-10 NOTE — Telephone Encounter (Signed)
 Dr. Norleen, patient will be scheduled as soon as possible.  Auth Submission: NO AUTH NEEDED Site of care: Site of care: [facility] Payer: [employer] Medication & CPT/J Code(s) submitted: Venofer  (Iron  Sucrose) J1756 Route of submission (phone, fax, portal):  Phone # Fax # Auth type: Buy/Bill PB Units/visits requested: 200mg  x 5 doses Reference number:  Approval from: 10/10/23 to 03/29/24

## 2023-10-16 ENCOUNTER — Ambulatory Visit (INDEPENDENT_AMBULATORY_CARE_PROVIDER_SITE_OTHER): Payer: Managed Care, Other (non HMO)

## 2023-10-16 VITALS — BP 115/80 | HR 69 | Temp 98.3°F | Resp 16 | Ht 63.0 in | Wt 259.4 lb

## 2023-10-16 DIAGNOSIS — D509 Iron deficiency anemia, unspecified: Secondary | ICD-10-CM | POA: Diagnosis not present

## 2023-10-16 MED ORDER — DIPHENHYDRAMINE HCL 25 MG PO CAPS
25.0000 mg | ORAL_CAPSULE | Freq: Once | ORAL | Status: AC
Start: 2023-10-16 — End: 2023-10-16
  Administered 2023-10-16: 25 mg via ORAL
  Filled 2023-10-16: qty 1

## 2023-10-16 MED ORDER — ACETAMINOPHEN 325 MG PO TABS
650.0000 mg | ORAL_TABLET | Freq: Once | ORAL | Status: AC
  Administered 2023-10-16: 650 mg via ORAL
  Filled 2023-10-16: qty 2

## 2023-10-16 MED ORDER — IRON SUCROSE 20 MG/ML IV SOLN
200.0000 mg | Freq: Once | INTRAVENOUS | Status: AC
  Administered 2023-10-16: 200 mg via INTRAVENOUS
  Filled 2023-10-16: qty 10

## 2023-10-16 NOTE — Progress Notes (Signed)
Diagnosis: Iron Deficiency Anemia  Provider:  Chilton Greathouse MD  Procedure: IV Push  IV Type: Peripheral, IV Location: R Antecubital  Venofer (Iron Sucrose), Dose: 200 mg  Post Infusion IV Care: Observation period completed and Peripheral IV Discontinued  Discharge: Condition: Good, Destination: Home . AVS Declined  Performed by:  Rico Ala, LPN

## 2023-10-20 ENCOUNTER — Ambulatory Visit: Payer: Managed Care, Other (non HMO) | Admitting: Obstetrics and Gynecology

## 2023-10-20 ENCOUNTER — Other Ambulatory Visit (HOSPITAL_COMMUNITY)
Admission: RE | Admit: 2023-10-20 | Discharge: 2023-10-20 | Disposition: A | Discharge status: Home / Self Care | Payer: Managed Care, Other (non HMO) | Source: Ambulatory Visit | Attending: Obstetrics and Gynecology | Admitting: Obstetrics and Gynecology

## 2023-10-20 VITALS — BP 136/90 | HR 85 | Wt 263.6 lb

## 2023-10-20 DIAGNOSIS — N930 Postcoital and contact bleeding: Secondary | ICD-10-CM | POA: Diagnosis not present

## 2023-10-20 DIAGNOSIS — Z124 Encounter for screening for malignant neoplasm of cervix: Secondary | ICD-10-CM

## 2023-10-20 DIAGNOSIS — O039 Complete or unspecified spontaneous abortion without complication: Secondary | ICD-10-CM

## 2023-10-20 DIAGNOSIS — Z5189 Encounter for other specified aftercare: Secondary | ICD-10-CM

## 2023-10-20 DIAGNOSIS — D6859 Other primary thrombophilia: Secondary | ICD-10-CM

## 2023-10-20 NOTE — Progress Notes (Unsigned)
CC: Bleeding after intercourse following D & C  -Has stopped

## 2023-10-21 ENCOUNTER — Ambulatory Visit: Payer: Managed Care, Other (non HMO)

## 2023-10-21 MED ORDER — ACETAMINOPHEN 325 MG PO TABS: 650.0000 mg | ORAL_TABLET | Freq: Once | ORAL | Status: AC

## 2023-10-21 MED ORDER — IRON SUCROSE 20 MG/ML IV SOLN: 200.0000 mg | Freq: Once | INTRAVENOUS | Status: AC

## 2023-10-21 MED ORDER — DIPHENHYDRAMINE HCL 25 MG PO CAPS: 25.0000 mg | ORAL_CAPSULE | Freq: Once | ORAL | Status: AC

## 2023-10-22 LAB — CERVICOVAGINAL ANCILLARY ONLY
Bacterial Vaginitis (gardnerella): NEGATIVE
Candida Glabrata: NEGATIVE
Candida Vaginitis: NEGATIVE
Chlamydia: NEGATIVE
Comment: NEGATIVE
Comment: NEGATIVE
Comment: NEGATIVE
Comment: NEGATIVE
Comment: NEGATIVE
Comment: NORMAL
Neisseria Gonorrhea: NEGATIVE
Trichomonas: NEGATIVE

## 2023-10-22 NOTE — Progress Notes (Signed)
Obstetrics and Gynecology Visit Return Patient Evaluation  Appointment Date: 10/20/2023  Primary Care Provider: Corwin Levins  OBGYN Clinic: [facility]   Chief Complaint: h/o post coital bleeding  History of Present Illness:  Bonnie Long is a 28 y.o. [phone] with above Cc. Patient had an 11/17 suction d&c for an SAB by Dr. Debroah Loop.   Interval History: Since that time, she states that she had a few episodes of post coital bleeding but this went away and she's had intercourse a few times and no issues. Betas were being followed with last one was 9 on 12/31 and plan to repeat it today. She also had some labs done when she was having a miscarriage and her factor 8 level was slightly elevated.  Patient believes she has had a period recently  Review of Systems: or as noted in the History of Present Illness.   Patient Active Problem List   Diagnosis Date Noted   Prediabetes 10/08/2023   Encounter for well adult exam with abnormal findings 10/07/2023   Iron deficiency anemia 10/07/2023   Family history of pulmonary embolism 09/04/2023   Positive pregnancy test 09/04/2023   Elevated factor 8 08/27/2023   Vitamin D deficiency 04/10/2020   Hypertension 02/11/2018   Obesity (BMI 30-39.9) 01/21/2018   Medications:  [name] had no medications administered during this visit. Current Outpatient Medications  Medication Sig Dispense Refill   amLODipine (NORVASC) 10 MG tablet Take 1 tablet (10 mg total) by mouth daily. 90 tablet 0   ferrous sulfate 324 (65 Fe) MG TBEC Take 1 tablet (325 mg total) by mouth every other day. 30 tablet 2   Prenatal Vit-Fe Fumarate-FA (PRENATAL MULTIVITAMIN) TABS tablet Take 1 tablet by mouth daily at 12 noon.     No current facility-administered medications for this visit.   Facility-Administered Medications Ordered in Other Visits  Medication Dose Route Frequency Provider Last Rate Last Admin   acetaminophen (TYLENOL)  tablet 650 mg  650 mg Oral Once Corwin Levins, MD       diphenhydrAMINE (BENADRYL) capsule 25 mg  25 mg Oral Once Corwin Levins, MD       iron sucrose (VENOFER) injection 200 mg  200 mg Intravenous Once Corwin Levins, MD        Allergies: has no known allergies.  Physical Exam:  BP (!) 136/90   Pulse 85   Wt 263 lb 9.6 oz (119.6 kg)   LMP 09/14/2023 (Approximate)   BMI 46.69 kg/m  Body mass index is 46.69 kg/m. General appearance: Well nourished, well developed female in no acute distress.  Abdomen: diffusely non tender to palpation, non distended, and no masses, hernias Neuro/Psych:  Normal mood and affect.    Pelvic exam:  Cervical exam performed in the presence of a chaperone EGBUS: wnl Vaginal vault: wnl Cervix:  wnl Bimanual: negative  Assessment: patient doing well  Plan:  1. Follow-up visit after miscarriage (Primary) Follow up beta - Beta hCG quant (ref lab)  2. Elevated factor 8 D/w her that could've been due to evaluation at the time of bleeding and recommend repeat which she is amenable to - Von Willebrand panel  3. PCB (post coital bleeding) Negative exam today. Pap due in a few months so done today - Cervicovaginal ancillary only( [place]) - Cytology - PAP( [place])  4. Cervical cancer screening - Cytology - PAP( [place])   RTC: PRN  Return if symptoms worsen or  fail to improve.  Future Appointments  Date Time Provider Department Center  10/23/2023  8:30 AM [facility] None  10/27/2023  8:30 AM [facility] None  10/29/2023  8:15 AM [facility] None    Cornelia Copa MD Attending [facility])

## 2023-10-23 ENCOUNTER — Ambulatory Visit (INDEPENDENT_AMBULATORY_CARE_PROVIDER_SITE_OTHER): Payer: Managed Care, Other (non HMO)

## 2023-10-23 VITALS — BP 129/91 | HR 79 | Temp 98.4°F | Resp 16 | Ht 63.0 in | Wt 261.4 lb

## 2023-10-23 DIAGNOSIS — D509 Iron deficiency anemia, unspecified: Secondary | ICD-10-CM | POA: Diagnosis not present

## 2023-10-23 LAB — VON WILLEBRAND PANEL
Factor VIII Activity: 175 % — ABNORMAL HIGH (ref 56–140)
Von Willebrand Ag: 136 % (ref 50–200)
Von Willebrand Factor: 59 % (ref 50–200)

## 2023-10-23 LAB — COAG STUDIES INTERP REPORT

## 2023-10-23 LAB — BETA HCG QUANT (REF LAB): hCG Quant: 2 m[IU]/mL

## 2023-10-23 MED ORDER — DIPHENHYDRAMINE HCL 25 MG PO CAPS
25.0000 mg | ORAL_CAPSULE | Freq: Once | ORAL | Status: DC
Start: 2023-10-23 — End: 2023-10-23

## 2023-10-23 MED ORDER — ACETAMINOPHEN 325 MG PO TABS: 650.0000 mg | ORAL_TABLET | Freq: Once | ORAL | Status: DC

## 2023-10-23 MED ORDER — IRON SUCROSE 20 MG/ML IV SOLN
200.0000 mg | Freq: Once | INTRAVENOUS | Status: AC
  Administered 2023-10-23: 200 mg via INTRAVENOUS
  Filled 2023-10-23: qty 10

## 2023-10-23 NOTE — Progress Notes (Signed)
Diagnosis: Iron Deficiency Anemia  Provider:  Chilton Greathouse MD  Procedure: IV Push  IV Type: Peripheral, IV Location: L Antecubital  Venofer (Iron Sucrose), Dose: 200 mg  Patient refused pre-medications. Nurse educated patient and stressed the importance of taking pre-medications as a precaution in the event of a medication reaction. Patient verbalized understanding.   Post Infusion IV Care: Observation period completed  Discharge: Condition: Good, Destination: Home . AVS Declined  Performed by:  Wyvonne Lenz, RN

## 2023-10-24 ENCOUNTER — Encounter: Payer: Self-pay | Admitting: Obstetrics and Gynecology

## 2023-10-24 NOTE — Addendum Note (Signed)
Addended by: Milford Bing on: 10/24/2023 11:25 AM   Modules accepted: Orders

## 2023-10-27 ENCOUNTER — Ambulatory Visit (INDEPENDENT_AMBULATORY_CARE_PROVIDER_SITE_OTHER): Payer: Managed Care, Other (non HMO)

## 2023-10-27 VITALS — BP 125/86 | HR 76 | Temp 98.8°F | Resp 18 | Ht 63.0 in | Wt 259.6 lb

## 2023-10-27 DIAGNOSIS — D509 Iron deficiency anemia, unspecified: Secondary | ICD-10-CM | POA: Diagnosis not present

## 2023-10-27 MED ORDER — DIPHENHYDRAMINE HCL 25 MG PO CAPS: 25.0000 mg | ORAL_CAPSULE | Freq: Once | ORAL | Status: DC

## 2023-10-27 MED ORDER — ACETAMINOPHEN 325 MG PO TABS: 650.0000 mg | ORAL_TABLET | Freq: Once | ORAL | Status: DC

## 2023-10-27 MED ORDER — IRON SUCROSE 20 MG/ML IV SOLN
200.0000 mg | Freq: Once | INTRAVENOUS | Status: AC
  Administered 2023-10-27: 200 mg via INTRAVENOUS
  Filled 2023-10-27: qty 10

## 2023-10-27 NOTE — Progress Notes (Signed)
Diagnosis: Iron Deficiency Anemia  Provider:  Chilton Greathouse MD  Procedure: IV Push  IV Type: Peripheral, IV Location: R Antecubital  Venofer (Iron Sucrose), Dose: 200 mg  Post Infusion IV Care: Observation period completed and Peripheral IV Discontinued  Discharge: Condition: Good, Destination: Home . AVS Declined  Performed by:  Loney Hering, LPN

## 2023-10-28 LAB — CYTOLOGY - PAP: Diagnosis: NEGATIVE

## 2023-10-29 ENCOUNTER — Ambulatory Visit (INDEPENDENT_AMBULATORY_CARE_PROVIDER_SITE_OTHER): Payer: Managed Care, Other (non HMO) | Admitting: *Deleted

## 2023-10-29 ENCOUNTER — Encounter: Payer: Self-pay | Admitting: Oncology

## 2023-10-29 ENCOUNTER — Inpatient Hospital Stay: Payer: Managed Care, Other (non HMO) | Attending: Oncology | Admitting: Oncology

## 2023-10-29 ENCOUNTER — Inpatient Hospital Stay: Payer: Managed Care, Other (non HMO)

## 2023-10-29 VITALS — BP 122/80 | HR 73 | Temp 98.6°F | Resp 16 | Ht 63.0 in | Wt 260.8 lb

## 2023-10-29 VITALS — BP 138/96 | HR 89 | Temp 99.3°F | Resp 18 | Wt 259.3 lb

## 2023-10-29 DIAGNOSIS — Z8249 Family history of ischemic heart disease and other diseases of the circulatory system: Secondary | ICD-10-CM

## 2023-10-29 DIAGNOSIS — O039 Complete or unspecified spontaneous abortion without complication: Secondary | ICD-10-CM | POA: Insufficient documentation

## 2023-10-29 DIAGNOSIS — D509 Iron deficiency anemia, unspecified: Secondary | ICD-10-CM

## 2023-10-29 DIAGNOSIS — R791 Abnormal coagulation profile: Secondary | ICD-10-CM | POA: Insufficient documentation

## 2023-10-29 MED ORDER — DIPHENHYDRAMINE HCL 25 MG PO CAPS: 25.0000 mg | ORAL_CAPSULE | Freq: Once | ORAL | Status: DC

## 2023-10-29 MED ORDER — IRON SUCROSE 20 MG/ML IV SOLN
200.0000 mg | Freq: Once | INTRAVENOUS | Status: AC
  Administered 2023-10-29: 200 mg via INTRAVENOUS
  Filled 2023-10-29: qty 10

## 2023-10-29 MED ORDER — ACETAMINOPHEN 325 MG PO TABS: 650.0000 mg | ORAL_TABLET | Freq: Once | ORAL | Status: DC

## 2023-10-29 NOTE — Assessment & Plan Note (Signed)
Currently managed by primary care provider.  I will defer to PCP

## 2023-10-29 NOTE — Progress Notes (Signed)
Diagnosis: Iron Deficiency Anemia  Provider:  Chilton Greathouse MD  Procedure: IV Push  IV Type: Peripheral, IV Location: L Forearm  Venofer (Iron Sucrose), Dose: 200 mg  Patient was advised of benefits of Tylenol and Benadryl, patient refused to take at this time  Post Infusion IV Care: Observation period completed and Peripheral IV Discontinued  Discharge: Condition: Good, Destination: Home . AVS Declined  Performed by:  Forrest Moron, RN

## 2023-10-29 NOTE — Assessment & Plan Note (Signed)
Elevated factor VIII level activity could be secondary to multiple reactive process.  She has no personal thrombosis history I recommend observation and trend level in 3 months.

## 2023-10-29 NOTE — Progress Notes (Signed)
Hematology/Oncology Consult note [phone] [phone]        REFERRING PROVIDER: [place] Bing, MD   CHIEF COMPLAINTS/REASON FOR VISIT:  Evaluation of hypercoagulable state-elevated factor VIII level   ASSESSMENT & PLAN:   Iron deficiency anemia Currently managed by primary care provider.  I will defer to PCP  Elevated factor VIII level Elevated factor VIII level activity could be secondary to multiple reactive process.  She has no personal thrombosis history I recommend observation and trend level in 3 months.  Miscarriage Follow-up with OB/GYN. Antiphospholipid syndrome antibodies negative.  Check beta glycoprotein antibodies.  Family history of thrombosis I will check prothrombin gene mutation, Antithrombin III, protein S and C. She has been tested negative for factor V Leiden mutation   Orders Placed This Encounter  Procedures   Antithrombin III    Standing Status:   Future    Number of Occurrences:   1    Expected Date:   10/29/2023    Expiration Date:   10/28/2024   Prothrombin gene mutation    Standing Status:   Future    Number of Occurrences:   1    Expected Date:   10/29/2023    Expiration Date:   10/28/2024   Protein S, total and free    Standing Status:   Future    Number of Occurrences:   1    Expected Date:   10/29/2023    Expiration Date:   10/28/2024   Protein C activity    Standing Status:   Future    Number of Occurrences:   1    Expected Date:   10/29/2023    Expiration Date:   10/28/2024   Beta-2-glycoprotein i abs, IgG/M/A    Standing Status:   Future    Number of Occurrences:   1    Expected Date:   10/29/2023    Expiration Date:   10/28/2024   Follow-up in a few weeks to go over results. All questions were answered. The patient knows to call the clinic with any problems, questions or concerns.  Rickard Patience, MD, PhD [facility] Hematology Oncology 10/29/2023   HISTORY OF PRESENTING ILLNESS:   Bonnie Long is a   28 y.o.  female with PMH listed below was seen in consultation at the request of  [place] Bing, MD  for evaluation of elevated factor VIII level   The patient presents with a history of miscarriage and elevated factor VIII levels.  She experienced a miscarriage in November 2024, which led to further investigation. There have been no other miscarriages, and she has no current vaginal bleeding or bleeding post-intercourse.   Testing revealed elevated factor VIII activity levels, initially in the 250s on 08/17/2023 right after miscarriage, also elevated von Willebrand antigen at 283., factor VIII activity level has decreased to 175 on 10/20/2023 and normalized von Willebrand antigen level.. She has not experienced any episodes of blood clots and has no history of thrombosis.  Her family history is significant for her mother having a factor V Leiden mutation and passing away from a pulmonary embolism. Her mother also had multiple deep vein thromboses and was treated with Lovenox and Coumadin.   Patient was tested negative for factor V Leiden mutation.  Negative antiphospholipid syndrome antibodies,  She is currently receiving iron infusions for low iron and ferritin levels, managed by her primary doctor. She denies heavy menstrual bleeding and reports no blood in her stool.    MEDICAL HISTORY:  Past  Medical History:  Diagnosis Date   Anxiety 02/25/2023   Blood transfusion without reported diagnosis    Chronic hypertension with superimposed preeclampsia 05/03/2021   Hypertension    Migraine 04/10/2020   Pregnancy induced hypertension    Severe pre-eclampsia 05/06/2021   UTI (urinary tract infection)     SURGICAL HISTORY: Past Surgical History:  Procedure Laterality Date   CESAREAN SECTION N/A 05/06/2021   Procedure: CESAREAN SECTION;  Surgeon: [place] Bing, MD;  Location: [facility];  Service: Obstetrics;  Laterality: N/A;   DILATION AND EVACUATION N/A 08/17/2023   Procedure:  DILATATION AND EVACUATION;  Surgeon: Adam Phenix, MD;  Location: [facility] OR;  Service: Gynecology;  Laterality: N/A;    SOCIAL HISTORY: Social History   Socioeconomic History   Marital status: Married    Spouse name: Not on file   Number of children: Not on file   Years of education: Not on file   Highest education level: Some college, no degree  Occupational History   Not on file  Tobacco Use   Smoking status: Never   Smokeless tobacco: Never  Vaping Use   Vaping status: Never Used  Substance and Sexual Activity   Alcohol use: No   Drug use: No   Sexual activity: Not Currently    Partners: Male    Birth control/protection: None  Other Topics Concern   Not on file  Social History Narrative   Not on file   Social Drivers of Health   Financial Resource Strain: Low Risk  (10/03/2023)   Overall Financial Resource Strain (CARDIA)    Difficulty of Paying Living Expenses: Not very hard  Food Insecurity: No Food Insecurity (10/03/2023)   Hunger Vital Sign    Worried About Running Out of Food in the Last Year: Never true    Ran Out of Food in the Last Year: Never true  Transportation Needs: No Transportation Needs (10/03/2023)   PRAPARE - Administrator, Civil Service (Medical): No    Lack of Transportation (Non-Medical): No  Physical Activity: Insufficiently Active (10/03/2023)   Exercise Vital Sign    Days of Exercise per Week: 3 days    Minutes of Exercise per Session: 20 min  Stress: Stress Concern Present (10/03/2023)   [employer] of Occupational Health - Occupational Stress Questionnaire    Feeling of Stress : To some extent  Social Connections: Moderately Integrated (10/03/2023)   Social Connection and Isolation Panel [NHANES]    Frequency of Communication with Friends and Family: More than three times a week    Frequency of Social Gatherings with Friends and Family: Twice a week    Attends Religious Services: More than 4 times per year    Active Member of  [employer] or Organizations: No    Attends Engineer, structural: Not on file    Marital Status: Married  Catering manager Violence: Not At Risk (06/21/2021)   Humiliation, Afraid, Rape, and Kick questionnaire    Fear of Current or Ex-Partner: No    Emotionally Abused: No    Physically Abused: No    Sexually Abused: No    FAMILY HISTORY: Family History  Problem Relation Age of Onset   Pulmonary embolism Mother    Factor V Leiden deficiency Mother    Hypertension Father    Hypertension Maternal Grandfather     ALLERGIES:  has no known allergies.  MEDICATIONS:  Current Outpatient Medications  Medication Sig Dispense Refill   amLODipine (NORVASC) 10 MG tablet  Take 1 tablet (10 mg total) by mouth daily. 90 tablet 0   ferrous sulfate 324 (65 Fe) MG TBEC Take 1 tablet (325 mg total) by mouth every other day. 30 tablet 2   Prenatal Vit-Fe Fumarate-FA (PRENATAL MULTIVITAMIN) TABS tablet Take 1 tablet by mouth daily at 12 noon.     No current facility-administered medications for this visit.   Facility-Administered Medications Ordered in Other Visits  Medication Dose Route Frequency Provider Last Rate Last Admin   acetaminophen (TYLENOL) tablet 650 mg  650 mg Oral Once Corwin Levins, MD       diphenhydrAMINE (BENADRYL) capsule 25 mg  25 mg Oral Once Corwin Levins, MD       iron sucrose (VENOFER) injection 200 mg  200 mg Intravenous Once Corwin Levins, MD        Review of Systems  Constitutional:  Positive for fatigue. Negative for appetite change, chills and fever.  HENT:   Negative for hearing loss and voice change.   Eyes:  Negative for eye problems.  Respiratory:  Negative for chest tightness and cough.   Cardiovascular:  Negative for chest pain.  Gastrointestinal:  Negative for abdominal distention, abdominal pain and blood in stool.  Endocrine: Negative for hot flashes.  Genitourinary:  Negative for difficulty urinating and frequency.   Musculoskeletal:  Negative for  arthralgias.  Skin:  Negative for itching and rash.  Neurological:  Negative for extremity weakness.  Hematological:  Negative for adenopathy.  Psychiatric/Behavioral:  Negative for confusion.    PHYSICAL EXAMINATION:  Vitals:   10/29/23 1542  BP: (!) 138/96  Pulse: 89  Resp: 18  Temp: 99.3 F (37.4 C)  SpO2: 100%   Filed Weights   10/29/23 1542  Weight: 259 lb 4.8 oz (117.6 kg)    Physical Exam Constitutional:      General: She is not in acute distress. HENT:     Head: Normocephalic and atraumatic.  Eyes:     General: No scleral icterus. Cardiovascular:     Rate and Rhythm: Normal rate and regular rhythm.     Heart sounds: Normal heart sounds.  Pulmonary:     Effort: Pulmonary effort is normal. No respiratory distress.     Breath sounds: No wheezing.  Abdominal:     General: Bowel sounds are normal. There is no distension.     Palpations: Abdomen is soft.  Musculoskeletal:        General: No deformity. Normal range of motion.     Cervical back: Normal range of motion and neck supple.  Skin:    General: Skin is warm and dry.     Findings: No erythema or rash.  Neurological:     Mental Status: She is alert and oriented to person, place, and time. Mental status is at baseline.     Cranial Nerves: No cranial nerve deficit.     Coordination: Coordination normal.  Psychiatric:        Mood and Affect: Mood normal.     LABORATORY DATA:  I have reviewed the data as listed    Latest Ref Rng & Units 10/07/2023    9:27 AM 09/04/2023    5:26 PM 08/17/2023    5:53 PM  CBC  WBC 4.0 - 10.5 K/uL 8.0  6.7    Hemoglobin 12.0 - 15.0 g/dL 16.1  09.6  04.5   Hematocrit 36.0 - 46.0 % 35.8  31.3  33.0   Platelets 150.0 - 400.0 K/uL 475.0  402  Latest Ref Rng & Units 10/07/2023    9:27 AM 09/04/2023    5:26 PM 08/17/2023    5:53 PM  CMP  Glucose 70 - 99 mg/dL 91  83    BUN 6 - 23 mg/dL 15  13    Creatinine 5.78 - 1.20 mg/dL 4.69  6.29    Sodium 528 - 145 mEq/L 138   135  135   Potassium 3.5 - 5.1 mEq/L 4.0  3.5  4.0   Chloride 96 - 112 mEq/L 101  102    CO2 19 - 32 mEq/L 30  24    Calcium 8.4 - 10.5 mg/dL 9.2  8.8    Total Protein 6.0 - 8.3 g/dL 8.0  7.1    Total Bilirubin 0.2 - 1.2 mg/dL 0.3  0.5    Alkaline Phos 39 - 117 U/L 66  54    AST 0 - 37 U/L 17  17    ALT 0 - 35 U/L 12  16        RADIOGRAPHIC STUDIES: I have personally reviewed the radiological images as listed and agreed with the findings in the report. CT Angio Chest PE W and/or Wo Contrast Result Date: 09/04/2023 CLINICAL DATA:  Concern for pulmonary vein. EXAM: CT ANGIOGRAPHY CHEST WITH CONTRAST TECHNIQUE: Multidetector CT imaging of the chest was performed using the standard protocol during bolus administration of intravenous contrast. Multiplanar CT image reconstructions and MIPs were obtained to evaluate the vascular anatomy. RADIATION DOSE REDUCTION: This exam was performed according to the departmental dose-optimization program which includes automated exposure control, adjustment of the mA and/or kV according to patient size and/or use of iterative reconstruction technique. CONTRAST:  65mL OMNIPAQUE IOHEXOL 350 MG/ML SOLN COMPARISON:  None Available. FINDINGS: Cardiovascular: There is no cardiomegaly or pericardial effusion. The thoracic aorta is unremarkable. The origins of the great vessels of the aortic arch are patent. No pulmonary artery embolus identified. Mediastinum/Nodes: No hilar or mediastinal adenopathy. The esophagus is grossly unremarkable. No mediastinal fluid collection. Lungs/Pleura: The lungs are clear. There is no pleural effusion or pneumothorax. The central airways are patent. Upper Abdomen: No acute abnormality. Musculoskeletal: No acute osseous pathology. Review of the MIP images confirms the above findings. IMPRESSION: No acute intrathoracic pathology. No CT evidence of pulmonary artery embolus. Electronically Signed   By: Elgie Collard M.D.   On: 09/04/2023 21:16    US OB LESS THAN 14 WEEKS WITH OB TRANSVAGINAL Result Date: 08/17/2023 CLINICAL DATA:  Spontaneous abortion today. Patient passed pregnancy in bathroom. Vaginal bleeding. EXAM: OBSTETRIC <14 WK [place] AND TRANSVAGINAL OB US TECHNIQUE: Both transabdominal and transvaginal ultrasound examinations were performed for complete evaluation of the gestation as well as the maternal uterus, adnexal regions, and pelvic cul-de-sac. Transvaginal technique was performed to assess early pregnancy. COMPARISON:  None Available. FINDINGS: Intrauterine gestational sac: None Maternal uterus/adnexae: Heterogeneous endometrial thickness measures 26 mm. Tiny amount of fluid is seen in the endometrial cavity. Both ovaries are normal in appearance. No mass or abnormal free fluid identified. IMPRESSION: No IUP visualized. Heterogeneous endometrial thickening measuring 26 mm, with tiny amount of fluid seen in endometrial cavity. No evidence of adnexal mass or free fluid. Electronically Signed   By: Danae Orleans M.D.   On: 08/17/2023 12:30

## 2023-10-29 NOTE — Assessment & Plan Note (Signed)
Follow-up with OB/GYN. Antiphospholipid syndrome antibodies negative.  Check beta glycoprotein antibodies.

## 2023-10-29 NOTE — Assessment & Plan Note (Signed)
I will check prothrombin gene mutation, Antithrombin III, protein S and C. She has been tested negative for factor V Leiden mutation

## 2023-10-30 ENCOUNTER — Encounter: Payer: Self-pay | Admitting: Obstetrics and Gynecology

## 2023-10-30 LAB — PROTEIN S, TOTAL AND FREE
Protein S Ag, Free: 116 % (ref 61–136)
Protein S Ag, Total: 82 % (ref 60–150)

## 2023-10-30 LAB — ANTITHROMBIN III: AntiThromb III Func: 102 % (ref 75–120)

## 2023-10-30 LAB — BETA-2-GLYCOPROTEIN I ABS, IGG/M/A
Beta-2 Glyco I IgG: <9 GPI IgG units (ref 0–20)
Beta-2-Glycoprotein I IgA: <9 GPI IgA units (ref 0–25)
Beta-2-Glycoprotein I IgM: <9 GPI IgM units (ref 0–32)

## 2023-10-30 LAB — PROTEIN C ACTIVITY: Protein C Activity: 110 % (ref 73–180)

## 2023-11-03 LAB — PROTHROMBIN GENE MUTATION

## 2023-11-04 ENCOUNTER — Ambulatory Visit (INDEPENDENT_AMBULATORY_CARE_PROVIDER_SITE_OTHER): Payer: Managed Care, Other (non HMO) | Admitting: *Deleted

## 2023-11-04 ENCOUNTER — Encounter: Payer: Self-pay | Admitting: Internal Medicine

## 2023-11-04 VITALS — BP 122/80 | HR 85 | Temp 99.3°F | Resp 16 | Ht 63.0 in | Wt 260.8 lb

## 2023-11-04 DIAGNOSIS — D509 Iron deficiency anemia, unspecified: Secondary | ICD-10-CM | POA: Diagnosis not present

## 2023-11-04 MED ORDER — ACETAMINOPHEN 325 MG PO TABS
650.0000 mg | ORAL_TABLET | Freq: Once | ORAL | Status: DC
Start: 2023-11-04 — End: 2023-11-04

## 2023-11-04 MED ORDER — DIPHENHYDRAMINE HCL 25 MG PO CAPS
25.0000 mg | ORAL_CAPSULE | Freq: Once | ORAL | Status: DC
Start: 2023-11-04 — End: 2023-11-04

## 2023-11-04 MED ORDER — SODIUM CHLORIDE 0.9 % IV BOLUS
250.0000 mL | Freq: Once | INTRAVENOUS | Status: DC
  Filled 2023-11-04: qty 250

## 2023-11-04 MED ORDER — IRON SUCROSE 20 MG/ML IV SOLN
200.0000 mg | Freq: Once | INTRAVENOUS | Status: AC
  Administered 2023-11-04: 200 mg via INTRAVENOUS
  Filled 2023-11-04: qty 10

## 2023-11-04 NOTE — Progress Notes (Signed)
 Diagnosis: Iron Deficiency Anemia  Provider:  Chilton Greathouse MD  Procedure: IV Push  IV Type: Peripheral, IV Location: L Antecubital  Venofer (Iron Sucrose), Dose: 200 mg  Post Infusion IV Care: Observation period completed and Peripheral IV Discontinued  Discharge: Condition: Good, Destination: Home . AVS Declined  Performed by:  Forrest Moron, RN

## 2023-11-26 ENCOUNTER — Inpatient Hospital Stay: Payer: Managed Care, Other (non HMO) | Attending: Oncology | Admitting: Oncology

## 2023-12-03 ENCOUNTER — Other Ambulatory Visit: Payer: Self-pay | Admitting: Internal Medicine

## 2023-12-03 DIAGNOSIS — I10 Essential (primary) hypertension: Secondary | ICD-10-CM

## 2023-12-04 ENCOUNTER — Other Ambulatory Visit: Payer: Self-pay

## 2023-12-04 MED ORDER — AMLODIPINE BESYLATE 10 MG PO TABS: 10.0000 mg | ORAL_TABLET | Freq: Every day | ORAL | 0 refills | Status: DC

## 2023-12-11 ENCOUNTER — Ambulatory Visit: Admitting: Internal Medicine

## 2023-12-11 ENCOUNTER — Ambulatory Visit (INDEPENDENT_AMBULATORY_CARE_PROVIDER_SITE_OTHER)

## 2023-12-11 VITALS — BP 128/96 | HR 77 | Temp 98.7°F | Ht 63.0 in | Wt 265.0 lb

## 2023-12-11 DIAGNOSIS — D509 Iron deficiency anemia, unspecified: Secondary | ICD-10-CM | POA: Diagnosis not present

## 2023-12-11 DIAGNOSIS — M545 Low back pain, unspecified: Secondary | ICD-10-CM

## 2023-12-11 DIAGNOSIS — R7303 Prediabetes: Secondary | ICD-10-CM | POA: Diagnosis not present

## 2023-12-11 DIAGNOSIS — Z0000 Encounter for general adult medical examination without abnormal findings: Secondary | ICD-10-CM

## 2023-12-11 DIAGNOSIS — E538 Deficiency of other specified B group vitamins: Secondary | ICD-10-CM | POA: Diagnosis not present

## 2023-12-11 DIAGNOSIS — E559 Vitamin D deficiency, unspecified: Secondary | ICD-10-CM | POA: Diagnosis not present

## 2023-12-11 DIAGNOSIS — I10 Essential (primary) hypertension: Secondary | ICD-10-CM

## 2023-12-11 DIAGNOSIS — G8929 Other chronic pain: Secondary | ICD-10-CM

## 2023-12-11 DIAGNOSIS — Z3201 Encounter for pregnancy test, result positive: Secondary | ICD-10-CM

## 2023-12-11 DIAGNOSIS — Z0001 Encounter for general adult medical examination with abnormal findings: Secondary | ICD-10-CM

## 2023-12-11 DIAGNOSIS — Z1159 Encounter for screening for other viral diseases: Secondary | ICD-10-CM

## 2023-12-11 LAB — CBC WITH DIFFERENTIAL/PLATELET
Basophils Absolute: 0.1 10*3/uL (ref 0.0–0.1)
Basophils Relative: 0.7 % (ref 0.0–3.0)
Eosinophils Absolute: 0.1 10*3/uL (ref 0.0–0.7)
Eosinophils Relative: 1.1 % (ref 0.0–5.0)
HCT: 41.1 % (ref 36.0–46.0)
Hemoglobin: 13.2 g/dL (ref 12.0–15.0)
Lymphocytes Relative: 37.4 % (ref 12.0–46.0)
Lymphs Abs: 2.7 10*3/uL (ref 0.7–4.0)
MCHC: 32.2 g/dL (ref 30.0–36.0)
MCV: 77.9 fl — ABNORMAL LOW (ref 78.0–100.0)
Monocytes Absolute: 0.5 10*3/uL (ref 0.1–1.0)
Monocytes Relative: 7.7 % (ref 3.0–12.0)
Neutro Abs: 3.8 10*3/uL (ref 1.4–7.7)
Neutrophils Relative %: 53.1 % (ref 43.0–77.0)
Platelets: 345 10*3/uL (ref 150.0–400.0)
RBC: 5.27 Mil/uL — ABNORMAL HIGH (ref 3.87–5.11)
RDW: 18.2 % — ABNORMAL HIGH (ref 11.5–15.5)
WBC: 7.1 10*3/uL (ref 4.0–10.5)

## 2023-12-11 LAB — IBC PANEL
Iron: 52 ug/dL (ref 42–145)
Saturation Ratios: 14.1 % — ABNORMAL LOW (ref 20.0–50.0)
TIBC: 369.6 ug/dL (ref 250.0–450.0)
Transferrin: 264 mg/dL (ref 212.0–360.0)

## 2023-12-11 LAB — HEPATIC FUNCTION PANEL
ALT: 15 U/L (ref 0–35)
AST: 16 U/L (ref 0–37)
Albumin: 4.4 g/dL (ref 3.5–5.2)
Alkaline Phosphatase: 61 U/L (ref 39–117)
Bilirubin, Direct: 0.1 mg/dL (ref 0.0–0.3)
Total Bilirubin: 0.5 mg/dL (ref 0.2–1.2)
Total Protein: 7.7 g/dL (ref 6.0–8.3)

## 2023-12-11 LAB — URINALYSIS, ROUTINE W REFLEX MICROSCOPIC
Bilirubin Urine: NEGATIVE
Hgb urine dipstick: NEGATIVE
Ketones, ur: NEGATIVE
Leukocytes,Ua: NEGATIVE
Nitrite: NEGATIVE
Specific Gravity, Urine: 1.02 (ref 1.000–1.030)
Total Protein, Urine: NEGATIVE
Urine Glucose: NEGATIVE
Urobilinogen, UA: 0.2 (ref 0.0–1.0)
pH: 6 (ref 5.0–8.0)

## 2023-12-11 LAB — BASIC METABOLIC PANEL
BUN: 13 mg/dL (ref 6–23)
CO2: 29 meq/L (ref 19–32)
Calcium: 9.4 mg/dL (ref 8.4–10.5)
Chloride: 100 meq/L (ref 96–112)
Creatinine, Ser: 1.11 mg/dL (ref 0.40–1.20)
GFR: 68.75 mL/min (ref >60.00)
Glucose, Bld: 83 mg/dL (ref 70–99)
Potassium: 3.8 meq/L (ref 3.5–5.1)
Sodium: 137 meq/L (ref 135–145)

## 2023-12-11 LAB — FERRITIN: Ferritin: 81.6 ng/mL (ref 10.0–291.0)

## 2023-12-11 LAB — LIPID PANEL
Cholesterol: 158 mg/dL (ref 0–200)
HDL: 36.4 mg/dL — ABNORMAL LOW (ref >39.00)
LDL Cholesterol: 77 mg/dL (ref 0–99)
NonHDL: 121.1
Total CHOL/HDL Ratio: 4
Triglycerides: 222 mg/dL — ABNORMAL HIGH (ref 0.0–149.0)
VLDL: 44.4 mg/dL — ABNORMAL HIGH (ref 0.0–40.0)

## 2023-12-11 LAB — TSH: TSH: 2.45 u[IU]/mL (ref 0.35–5.50)

## 2023-12-11 LAB — VITAMIN D 25 HYDROXY (VIT D DEFICIENCY, FRACTURES): VITD: 30.59 ng/mL (ref 30.00–100.00)

## 2023-12-11 LAB — HCG, QUANTITATIVE, PREGNANCY: Quantitative HCG: 1.15 m[IU]/mL

## 2023-12-11 LAB — HEMOGLOBIN A1C: Hgb A1c MFr Bld: 5.6 % (ref 4.6–6.5)

## 2023-12-11 LAB — VITAMIN B12: Vitamin B-12: 398 pg/mL (ref 211–911)

## 2023-12-11 NOTE — Patient Instructions (Signed)
 Please continue all other medications as before, and refills have been done if requested.  Please have the pharmacy call with any other refills you may need.  Please continue your efforts at being more active, low cholesterol diet, and weight control.  You are otherwise up to date with prevention measures today.  Please keep your appointments with your specialists as you may have planned  Please go to the XRAY Department in the first floor for the x-ray testing  Please go to the LAB at the blood drawing area for the tests to be done  You will be contacted by phone if any changes need to be made immediately.  Otherwise, you will receive a letter about your results with an explanation, but please check with MyChart first.  Please make an Appointment to return in 6 months, or sooner if needed

## 2023-12-11 NOTE — Progress Notes (Signed)
 Patient ID: Bonnie Long, female   DOB: 1996-01-19, 28 y.o.   MRN: [id number]         Chief Complaint:: wellness exam and Back Pain (Patient has been having lower back pain and left side pelvic pain. Patient wants to know if her iron levels need to be rechecked)  , for pregnancy check, htn, iron deficiency, LBP chronic       HPI:  Bonnie Long is a 28 y.o. female here for wellness exam; declines flu and covid boosters, o/w up to date                Also has question of pregnancy today, BP has been controlled at home, needs to f/u iron lab, Pt continues to have recurring LBP without change in severity, bowel or bladder change, fever, wt loss,  worsening LE pain/numbness/weakness, gait change or falls, but is asking for walking desk for work exception     [employer] Readings from Last 3 Encounters:  12/11/23 265 lb (120.2 kg)  11/04/23 260 lb 12.8 oz (118.3 kg)  10/29/23 259 lb 4.8 oz (117.6 kg)   BP Readings from Last 3 Encounters:  12/11/23 (!) 128/96  11/04/23 122/80  10/29/23 (!) 138/96   Immunization History  Administered Date(s) Administered   DTaP 11/24/1997, 01/23/1998, 03/27/1998, 12/11/1998   HIB (PRP-OMP) 11/24/1997, 01/23/1998, 03/27/1998, 10/03/1998   HPV Quadrivalent 12/28/2015   Hep B, Unspecified 04/11/98, 11/24/1997, 03/27/1998   Hepatitis A, Ped/Adol-2 Dose 11/10/2015   IPV 11/24/1997, 01/23/1998, 03/27/1998, 11/10/2015   Influenza Nasal 07/18/2008   Influenza,inj,Quad PF,6+ Mos 08/13/2022   Influenza,inj,quad, With Preservative 11/10/2015   Influenza-Unspecified 10/31/2020   MMR 10/03/1998, 11/11/2001   Meningococcal Conjugate 01/18/2016   PFIZER(Purple Top)SARS-COV-2 Vaccination 03/30/2020, 04/27/2020   PPD Test 10/02/2021   Tdap 06/13/2009, 03/01/2021   Varicella 10/03/1998, 11/10/2015   Health Maintenance Due  Topic Date Due   HPV VACCINES (2 - 3-dose series) 01/25/2016      Past Medical History:  Diagnosis Date   Anxiety 02/25/2023   Blood  transfusion without reported diagnosis    Chronic hypertension with superimposed preeclampsia 05/03/2021   Hypertension    Migraine 04/10/2020   Pregnancy induced hypertension    Severe pre-eclampsia 05/06/2021   UTI (urinary tract infection)    Past Surgical History:  Procedure Laterality Date   CESAREAN SECTION N/A 05/06/2021   Procedure: CESAREAN SECTION;  Surgeon: Faxon Bing, MD;  Location: [facility] OR[facility];  Service: Obstetrics;  Laterality: N/A;   DILATION AND EVACUATION N/A 08/17/2023   Procedure: DILATATION AND EVACUATION;  Surgeon: Adam Phenix, MD;  Location: [facility] OR;  Service: Gynecology;  Laterality: N/A;    reports that she has never smoked. She has never used smokeless tobacco. She reports that she does not drink alcohol and does not use drugs. family history includes Factor V Leiden deficiency in her mother; Hypertension in her father and maternal grandfather; Pulmonary embolism in her mother. No Known Allergies Current Outpatient Medications on File Prior to Visit  Medication Sig Dispense Refill   amLODipine (NORVASC) 10 MG tablet Take 1 tablet (10 mg total) by mouth daily. 90 tablet 0   ferrous sulfate 324 (65 Fe) MG TBEC Take 1 tablet (325 mg total) by mouth every other day. 30 tablet 2   Prenatal Vit-Fe Fumarate-FA (PRENATAL MULTIVITAMIN) TABS tablet Take 1 tablet by mouth daily at 12 noon.     Current Facility-Administered Medications on File Prior to Visit  Medication Dose Route Frequency Provider Last  Rate Last Admin   acetaminophen (TYLENOL) tablet 650 mg  650 mg Oral Once Corwin Levins, MD       diphenhydrAMINE (BENADRYL) capsule 25 mg  25 mg Oral Once Corwin Levins, MD       iron sucrose (VENOFER) injection 200 mg  200 mg Intravenous Once Corwin Levins, MD            ROS:  All others reviewed and negative.  Objective        PE:  BP (!) 128/96 (BP Location: Left Arm, Patient Position: Sitting, Cuff Size: Normal)   Pulse 77   Temp 98.7 F (37.1 C) (Oral)    Ht 5\' 3"  (1.6 m)   Wt 265 lb (120.2 kg)   SpO2 98%   BMI 46.94 kg/m                 Constitutional: Pt appears in NAD               HENT: Head: NCAT.                Right Ear: External ear normal.                 Left Ear: External ear normal.                Eyes: . Pupils are equal, round, and reactive to light. Conjunctivae and EOM are normal               Nose: without d/c or deformity               Neck: Neck supple. Gross normal ROM               Cardiovascular: Normal rate and regular rhythm.                 Pulmonary/Chest: Effort normal and breath sounds without rales or wheezing.                Abd:  Soft, NT, ND, + BS, no organomegaly               Neurological: Pt is alert. At baseline orientation, motor grossly intact               Skin: Skin is warm. No rashes, no other new lesions, LE edema - none               Psychiatric: Pt behavior is normal without agitation   Micro: none  Cardiac tracings I have personally interpreted today:  none  Pertinent Radiological findings (summarize): none   Lab Results  Component Value Date   WBC 7.1 12/11/2023   HGB 13.2 12/11/2023   HCT 41.1 12/11/2023   PLT 345.0 12/11/2023   GLUCOSE 83 12/11/2023   CHOL 158 12/11/2023   TRIG 222.0 (H) 12/11/2023   HDL 36.40 (L) 12/11/2023   LDLCALC 77 12/11/2023   ALT 15 12/11/2023   AST 16 12/11/2023   NA 137 12/11/2023   K 3.8 12/11/2023   CL 100 12/11/2023   CREATININE 1.11 12/11/2023   BUN 13 12/11/2023   CO2 29 12/11/2023   TSH 2.45 12/11/2023   HGBA1C 5.6 12/11/2023   Assessment/Plan:  Bonnie Long is a 28 y.o. Black or African American [2] female with  has a past medical history of Anxiety (02/25/2023), Blood transfusion without reported diagnosis, Chronic hypertension with superimposed preeclampsia (05/03/2021), Hypertension, Migraine (04/10/2020), Pregnancy induced hypertension, Severe pre-eclampsia (  05/06/2021), and UTI (urinary tract infection).  Encounter for well  adult exam with abnormal findings Age and sex appropriate education and counseling updated with regular exercise and diet Referrals for preventative services - none needed Immunizations addressed - declines flu and covid booster Smoking counseling  - none needed Evidence for depression or other mood disorder - none significant Most recent labs reviewed. I have personally reviewed and have noted: 1) the patient's medical and social history 2) The patient's current medications and supplements 3) The patient's height, weight, and BMI have been recorded in the chart   Hypertension BP Readings from Last 3 Encounters:  12/11/23 (!) 128/96  11/04/23 122/80  10/29/23 (!) 138/96   Uncontrolled, pt to cont norvasc 10 every day as pt states controlled at home   Iron deficiency anemia Also for f/u iron lab  Prediabetes Lab Results  Component Value Date   HGBA1C 5.6 12/11/2023   Stable, pt to continue current medical treatment  - diet,wt control   Vitamin D deficiency Last vitamin D Lab Results  Component Value Date   VD25OH 30.59 12/11/2023   Low, to start oral replacement   Positive pregnancy test For quant B hcg today  Chronic bilateral low back pain without sciatica Ok for ls spine xrays,  to f/u any worsening symptoms or concerns  Followup: Return in about 6 months (around 06/12/2024).  Oliver Barre, MD 12/13/2023 10:27 PM [facility]

## 2023-12-12 ENCOUNTER — Telehealth: Payer: Self-pay | Admitting: Internal Medicine

## 2023-12-12 DIAGNOSIS — Z3201 Encounter for pregnancy test, result positive: Secondary | ICD-10-CM

## 2023-12-12 LAB — HEPATITIS C ANTIBODY: Hepatitis C Ab: NONREACTIVE

## 2023-12-12 NOTE — Telephone Encounter (Signed)
 D/w pt lab results and slight elevated B HCG -   Ok to repeat the test in 1 wk to reassess and confirm possible pregnancy  Pending results, pt may need f/u with OB GYN

## 2023-12-13 ENCOUNTER — Encounter: Payer: Self-pay | Admitting: Internal Medicine

## 2023-12-13 DIAGNOSIS — M545 Low back pain, unspecified: Secondary | ICD-10-CM | POA: Insufficient documentation

## 2023-12-13 NOTE — Assessment & Plan Note (Signed)
 Lab Results  Component Value Date   HGBA1C 5.6 12/11/2023   Stable, pt to continue current medical treatment  - diet, wt control

## 2023-12-13 NOTE — Assessment & Plan Note (Signed)
 Last vitamin D Lab Results  Component Value Date   VD25OH 30.59 12/11/2023   Low, to start oral replacement

## 2023-12-13 NOTE — Assessment & Plan Note (Signed)
 Ok for ls spine xrays,  to f/u any worsening symptoms or concerns

## 2023-12-13 NOTE — Assessment & Plan Note (Signed)
Also for f/u iron lab ?

## 2023-12-13 NOTE — Assessment & Plan Note (Signed)
 BP Readings from Last 3 Encounters:  12/11/23 (!) 128/96  11/04/23 122/80  10/29/23 (!) 138/96   Uncontrolled, pt to cont norvasc 10 every day as pt states controlled at home

## 2023-12-13 NOTE — Assessment & Plan Note (Signed)
Age and sex appropriate education and counseling updated with regular exercise and diet Referrals for preventative services - none needed Immunizations addressed - declines flu and covid booster Smoking counseling  - none needed Evidence for depression or other mood disorder - none significant Most recent labs reviewed. I have personally reviewed and have noted: 1) the patient's medical and social history 2) The patient's current medications and supplements 3) The patient's height, weight, and BMI have been recorded in the chart

## 2023-12-13 NOTE — Assessment & Plan Note (Signed)
 For quant B hcg today

## 2023-12-16 ENCOUNTER — Encounter: Payer: Self-pay | Admitting: Internal Medicine

## 2023-12-24 ENCOUNTER — Other Ambulatory Visit (HOSPITAL_COMMUNITY)
Admission: RE | Admit: 2023-12-24 | Discharge: 2023-12-24 | Disposition: A | Discharge status: Home / Self Care | Source: Ambulatory Visit | Attending: Obstetrics & Gynecology | Admitting: Obstetrics & Gynecology

## 2023-12-24 ENCOUNTER — Ambulatory Visit: Admitting: Obstetrics & Gynecology

## 2023-12-24 VITALS — BP 149/107 | HR 73 | Wt 267.0 lb

## 2023-12-24 DIAGNOSIS — N926 Irregular menstruation, unspecified: Secondary | ICD-10-CM | POA: Diagnosis present

## 2023-12-24 NOTE — Progress Notes (Signed)
 GYNECOLOGY OFFICE VISIT NOTE  History:   Bonnie Long is a 28 y.o. G2P1011 here today for discussion about irregular menstrual periods. Reports that since her miscarriage late last year, her cycles are shorted about 24-26 days in length. However, since February had bleeding about 3 weeks after her period, not mid cycle.  LMP 12/01/23-12/08/23, then restarted on 12/21/23 and has continued. She denies any abdominal pain, pelvic pain or other concerns.    Past Medical History:  Diagnosis Date   Anxiety 02/25/2023   Blood transfusion without reported diagnosis    Chronic hypertension with superimposed preeclampsia 05/03/2021   Hypertension    Migraine 04/10/2020   Pregnancy induced hypertension    Severe pre-eclampsia 05/06/2021   UTI (urinary tract infection)     Past Surgical History:  Procedure Laterality Date   CESAREAN SECTION N/A 05/06/2021   Procedure: CESAREAN SECTION;  Surgeon: Stratford Bing, MD;  Location: [facility];  Service: Obstetrics;  Laterality: N/A;   DILATION AND EVACUATION N/A 08/17/2023   Procedure: DILATATION AND EVACUATION;  Surgeon: Adam Phenix, MD;  Location: [facility] OR;  Service: Gynecology;  Laterality: N/A;   The following portions of the patient's history were reviewed and updated as appropriate: allergies, current medications, past family history, past medical history, past social history, past surgical history and problem list.   Health Maintenance:  Normal pap on 10/20/23.    Review of Systems:  Pertinent items noted in HPI and remainder of comprehensive ROS otherwise negative.  Physical Exam:  BP (!) 149/107   Pulse 73   Wt 267 lb (121.1 kg)   LMP 12/01/2023 (Approximate)   BMI 47.30 kg/m  CONSTITUTIONAL: Well-developed, well-nourished female in no acute distress.  HEENT:  Normocephalic, atraumatic. External right and left ear normal. No scleral icterus.  NECK: Normal range of motion, supple, no masses noted on observation SKIN: No rash noted.  Not diaphoretic. No erythema. No pallor. MUSCULOSKELETAL: Normal range of motion. No edema noted. NEUROLOGIC: Alert and oriented to person, place, and time. Normal muscle tone coordination. No cranial nerve deficit noted. PSYCHIATRIC: Normal mood and affect. Normal behavior. Normal judgment and thought content. CARDIOVASCULAR: Normal heart rate noted RESPIRATORY: Effort and breath sounds normal, no problems with respiration noted ABDOMEN: No masses noted. No other overt distention noted.   PELVIC: Normal appearing external genitalia; normal urethral meatus; normal appearing vaginal mucosa and cervix.  Bloody discharge noted, testing sample obtained.  No uterine or adnexal tenderness. Performed in the presence of a chaperone  Labs and Imaging    Latest Ref Rng & Units 12/11/2023    2:27 PM 10/07/2023    9:27 AM 09/04/2023    5:26 PM  CBC  WBC 4.0 - 10.5 K/uL 7.1  8.0  6.7   Hemoglobin 12.0 - 15.0 g/dL 09.6  04.5  40.9   Hematocrit 36.0 - 46.0 % 41.1  35.8  31.3   Platelets 150.0 - 400.0 K/uL 345.0  475.0  402    TSH 12/11/23  2.45     Assessment and Plan:     1. Irregular menses (Primary) Patient's irregular menses are most likely of an anovulatory etiology.   Will do labs to evaluate for PCOS or other etiologies. Will also order pelvic ultrasound to examine her ovaries and uterus; also to rule out any structural abnormalities. Bleeding precautions reviewed with patient. Briefly discussed management with progestin only methods, especially given her HTN.  Will discuss further at next visit. - Cervicovaginal ancillary only -  US PELVIC COMPLETE WITH TRANSVAGINAL; Future - DHEA-sulfate - Beta hCG quant (ref lab) - Testosterone,Free and Total   Routine preventative health maintenance measures emphasized. Please refer to After Visit Summary for other counseling recommendations.   Return for follow up to discuss results for evaluation of irregular menses.    I spent 25 minutes dedicated to  the care of this patient including pre-visit review of records, face to face time with the patient discussing her conditions and treatments, post visit ordering of medications and appropriate tests or procedures, coordinating care and documenting this visit encounter.    Jaynie Collins, MD, FACOG Obstetrician & Gynecologist, [facility] for [employer], [facility]

## 2023-12-25 ENCOUNTER — Ambulatory Visit

## 2023-12-25 ENCOUNTER — Encounter: Payer: Self-pay | Admitting: Obstetrics & Gynecology

## 2023-12-25 LAB — CERVICOVAGINAL ANCILLARY ONLY
Bacterial Vaginitis (gardnerella): NEGATIVE
Candida Glabrata: NEGATIVE
Candida Vaginitis: NEGATIVE
Chlamydia: NEGATIVE
Comment: NEGATIVE
Comment: NEGATIVE
Comment: NEGATIVE
Comment: NEGATIVE
Comment: NEGATIVE
Comment: NORMAL
Neisseria Gonorrhea: NEGATIVE
Trichomonas: NEGATIVE

## 2023-12-26 ENCOUNTER — Encounter: Payer: Self-pay | Admitting: Internal Medicine

## 2023-12-29 ENCOUNTER — Ambulatory Visit
Admission: RE | Admit: 2023-12-29 | Discharge: 2023-12-29 | Disposition: A | Discharge status: Home / Self Care | Source: Ambulatory Visit | Attending: Obstetrics & Gynecology | Admitting: Obstetrics & Gynecology

## 2023-12-29 DIAGNOSIS — N926 Irregular menstruation, unspecified: Secondary | ICD-10-CM | POA: Diagnosis present

## 2023-12-30 ENCOUNTER — Encounter: Payer: Self-pay | Admitting: Obstetrics & Gynecology

## 2023-12-30 DIAGNOSIS — E282 Polycystic ovarian syndrome: Secondary | ICD-10-CM | POA: Insufficient documentation

## 2023-12-30 HISTORY — DX: Polycystic ovarian syndrome: E28.2

## 2023-12-30 LAB — TESTOSTERONE,FREE AND TOTAL
Testosterone, Free: 1.1 pg/mL (ref 0.0–4.2)
Testosterone: 77 ng/dL — ABNORMAL HIGH (ref 13–71)

## 2023-12-30 LAB — DHEA-SULFATE: DHEA-SO4: 167 ug/dL (ref 84.8–378.0)

## 2023-12-30 LAB — BETA HCG QUANT (REF LAB): hCG Quant: <1 m[IU]/mL

## 2024-01-05 ENCOUNTER — Ambulatory Visit: Admitting: Obstetrics & Gynecology

## 2024-01-05 VITALS — BP 133/93 | HR 98

## 2024-01-05 DIAGNOSIS — E282 Polycystic ovarian syndrome: Secondary | ICD-10-CM

## 2024-01-05 NOTE — Progress Notes (Signed)
 GYNECOLOGY OFFICE VISIT NOTE  History:   Bonnie Long is a 28 y.o. G2P1011 here today for discussion of management of newly diagnosed PCOS (had elevated testosterone, polycystic ovaries and irregular menstrual periods). She denies any abnormal vaginal discharge, pelvic pain or other concerns.    Past Medical History:  Diagnosis Date   Anxiety 02/25/2023   Blood transfusion without reported diagnosis    Chronic hypertension with superimposed preeclampsia 05/03/2021   Hypertension    Migraine 04/10/2020   PCOS (polycystic ovarian syndrome) 12/30/2023   Pregnancy induced hypertension    Severe pre-eclampsia 05/06/2021   UTI (urinary tract infection)     Past Surgical History:  Procedure Laterality Date   CESAREAN SECTION N/A 05/06/2021   Procedure: CESAREAN SECTION;  Surgeon: [place] Bing, MD;  Location: [facility];  Service: Obstetrics;  Laterality: N/A;   DILATION AND EVACUATION N/A 08/17/2023   Procedure: DILATATION AND EVACUATION;  Surgeon: Adam Phenix, MD;  Location: [facility] OR;  Service: Gynecology;  Laterality: N/A;    The following portions of the patient's history were reviewed and updated as appropriate: allergies, current medications, past family history, past medical history, past social history, past surgical history and problem list.   Health Maintenance:  Normal pap on 10/20/2023.   Review of Systems:  Pertinent items noted in HPI and remainder of comprehensive ROS otherwise negative.  Physical Exam:  BP (!) 133/93   Pulse 98   LMP 12/01/2023 (Approximate)  CONSTITUTIONAL: Well-developed, well-nourished female in no acute distress.  HEENT:  Normocephalic, atraumatic. External right and left ear normal. No scleral icterus.  NECK: Normal range of motion, supple, no masses noted on observation SKIN: No rash noted. Not diaphoretic. No erythema. No pallor. MUSCULOSKELETAL: Normal range of motion. No edema noted. NEUROLOGIC: Alert and oriented to person,  place, and time. Normal muscle tone coordination. No cranial nerve deficit noted on observation. PSYCHIATRIC: Normal mood and affect. Normal behavior. Normal judgment and thought content. CARDIOVASCULAR: Normal heart rate noted RESPIRATORY: Effort and breath sounds normal, no problems with respiration noted ABDOMEN: No masses or other overt distention noted on observation. No tenderness.   PELVIC: Deferred  Labs and Imaging Results for orders placed or performed in visit on 12/24/23 (from the past 3 weeks)  Cervicovaginal ancillary only   Collection Time: 12/24/23  2:15 PM  Result Value Ref Range   Neisseria Gonorrhea Negative    Chlamydia Negative    Trichomonas Negative    Bacterial Vaginitis (gardnerella) Negative    Candida Vaginitis Negative    Candida Glabrata Negative    Comment Normal Reference Range Candida Species - Negative    Comment Normal Reference Range Candida Galbrata - Negative    Comment Normal Reference Range Trichomonas - Negative    Comment Normal Reference Ranger Chlamydia - Negative    Comment      Normal Reference Range Neisseria Gonorrhea - Negative   Comment      Normal Reference Range Bacterial Vaginosis - Negative  DHEA-sulfate   Collection Time: 12/24/23  2:28 PM  Result Value Ref Range   DHEA-SO4 167.0 84.8 - 378.0 ug/dL  Beta hCG quant (ref lab)   Collection Time: 12/24/23  2:28 PM  Result Value Ref Range   hCG Quant <1 mIU/mL  Testosterone,Free and Total   Collection Time: 12/24/23  2:28 PM  Result Value Ref Range   Testosterone 77 (H) 13 - 71 ng/dL   Testosterone, Free 1.1 0.0 - 4.2 pg/mL  Latest Ref Rng & Units 12/11/2023    2:27 PM 10/07/2023    9:27 AM 09/04/2023    5:26 PM  CMP  Glucose 70 - 99 mg/dL 83  91  83   BUN 6 - 23 mg/dL 13  15  13    Creatinine 0.40 - 1.20 mg/dL 1.47  8.29  5.62   Sodium 135 - 145 mEq/L 137  138  135   Potassium 3.5 - 5.1 mEq/L 3.8  4.0  3.5   Chloride 96 - 112 mEq/L 100  101  102   CO2 19 - 32 mEq/L  29  30  24    Calcium 8.4 - 10.5 mg/dL 9.4  9.2  8.8   Total Protein 6.0 - 8.3 g/dL 7.7  8.0  7.1   Total Bilirubin 0.2 - 1.2 mg/dL 0.5  0.3  0.5   Alkaline Phos 39 - 117 U/L 61  66  54   AST 0 - 37 U/L 16  17  17    ALT 0 - 35 U/L 15  12  16       US PELVIC COMPLETE WITH TRANSVAGINAL Result Date: 12/29/2023 CLINICAL DATA:  Irregular menses. EXAM: TRANSABDOMINAL AND TRANSVAGINAL ULTRASOUND OF PELVIS TECHNIQUE: Both transabdominal and transvaginal ultrasound examinations of the pelvis were performed. Transabdominal technique was performed for global imaging of the pelvis including uterus, ovaries, adnexal regions, and pelvic cul-de-sac. It was necessary to proceed with endovaginal exam following the transabdominal exam to visualize the endometrium and ovaries. COMPARISON:  None Available. FINDINGS: Uterus Measurements: 6.6 x 3.655.3 cm = volume: 66.3 mL. No fibroids or other mass visualized. Endometrium Thickness: 5.5 mm.  No focal abnormality visualized. Right ovary Measurements: 4.5 x 3.4 x 3.2 cm = volume: 26.3 mL. Multiple small cysts in string of pearl appearance is noted. Left ovary Measurements: 3.6 x 2.3 x 2.6 cm = volume: 11.3 mL. Multiple small cysts in string of pearls appearance is noted. Other findings No abnormal free fluid. IMPRESSION: Multiple small cysts in string of pearl appearance in both ovaries. This is nonspecific but can be seen in polycystic ovarian syndrome. Electronically Signed   By: Sherian Rein M.D.   On: 12/29/2023 16:18   DG Lumbar Spine Complete Result Date: 12/16/2023 CLINICAL DATA:  persistent pain with sitting at work EXAM: LUMBAR SPINE - COMPLETE 4+ VIEW COMPARISON:  None Available. FINDINGS: There is no evidence of lumbar spine fracture. Alignment is normal. Intervertebral disc spaces are maintained. Preserved vertebral body heights. No pars defects. SI joints are maintained. IMPRESSION: No acute or significant finding by plain radiography Electronically Signed   By:  Judie Petit.  Shick M.D.   On: 12/16/2023 09:00      Assessment and Plan:     1. PCOS (polycystic ovarian syndrome) (Primary) Counseled patient about management of PCOS, recommended weight loss which helps with restoring ovulatory cycles, decreases glucose intolerance with improvement of metabolic risk, improves fertility/pregnancy rates and helps with overall health.  Even modest weight loss (5 to 10 percent reduction in body weight) in women with PCOS may result in these effects.  OCPs are also the mainstay of pharmacologic therapy for women with PCOS for managing hyperandrogenism and menstrual dysfunction and for providing contraception.    PCOS can be also treated with Metformin given its association with glucose intolerance and insulin resistance.  It can also reduce ovarian androgen production and serum free testosterone concentrations, and help restore normal menstrual cyclicity.  Over 50% of PCOS patients on 1500mg  of Metformin daily  have been shown to ovulate successfully. Common side effects include GI intolerance, kidney and liver enzyme irregularities, lactic acidosis.  She will need baseline BUN,Cr, LFTs prior to starting therapy.  Patient wants to focus on weight loss for now.  May be interested in Metformin later (concerned about her last Cr values being on the upper side of normal).  She wanted to know what will happen if she wants to conceive. Discussed ovulation predictor kits,  which can help in knowing when she ovulates in order to have frequent intercourse at least every other day around the time of ovulation.  If this does not work, will consider Femara (Letrozole).   Please refer to After Visit Summary for other counseling recommendations.   Return for follow up as recommended.    I spent 45 minutes dedicated to the care of this patient including pre-visit review of records, face to face time with the patient discussing her conditions and treatments, post visit ordering of medications and  appropriate tests or procedures, coordinating care and documenting this visit encounter.    Jaynie Collins, MD, FACOG Obstetrician & Gynecologist, [facility], [facility] for [employer], [facility]

## 2024-01-05 NOTE — Patient Instructions (Signed)
 Research Metformin for PCOS treatment  Use ovulation predictor kits (next to pregnancy tests)  Take vitamins and folic acid supplement.  Work on weight loss  If ovulation does not happen naturally, will consider Letrozole (ovulation induction agent)

## 2024-02-18 ENCOUNTER — Encounter: Payer: Self-pay | Admitting: Internal Medicine

## 2024-02-18 DIAGNOSIS — R400 Somnolence: Secondary | ICD-10-CM

## 2024-02-24 ENCOUNTER — Ambulatory Visit: Admitting: Sleep Medicine

## 2024-02-26 ENCOUNTER — Ambulatory Visit: Admitting: Internal Medicine

## 2024-02-26 ENCOUNTER — Encounter: Payer: Self-pay | Admitting: Sleep Medicine

## 2024-02-26 ENCOUNTER — Ambulatory Visit (INDEPENDENT_AMBULATORY_CARE_PROVIDER_SITE_OTHER): Admitting: Sleep Medicine

## 2024-02-26 VITALS — BP 120/82 | HR 91 | Temp 97.1°F | Ht 63.0 in | Wt 266.8 lb

## 2024-02-26 DIAGNOSIS — I10 Essential (primary) hypertension: Secondary | ICD-10-CM | POA: Diagnosis not present

## 2024-02-26 DIAGNOSIS — G4733 Obstructive sleep apnea (adult) (pediatric): Secondary | ICD-10-CM

## 2024-02-26 NOTE — Progress Notes (Unsigned)
 Name:Bonnie Long MRN: [id number] DOB: November 21, 1995   CHIEF COMPLAINT:  EXCESSIVE DAYTIME SLEEPINESS   HISTORY OF PRESENT ILLNESS:  Bonnie Long is a 28 y.o. w/ a h/o HTN and morbid obesity who presents for c/o loud snoring and excessive daytime sleepiness which has been present for several years. Reports nocturnal awakenings due to nocturia, however does not have difficulty falling back to sleep. Reports a 20 lb weight gain. Denies morning headaches, RLS symptoms, dream enactment, cataplexy, hypnagogic or hypnapompic hallucinations. Reports a family history of sleep apnea. Denies drowsy driving. Denies caffeine use, denies alcohol, tobacco or denies illicit drug use.   Bedtime 10:30-11 pm Sleep onset 10 mins Rise time 7:30-10 am   EPWORTH SLEEP SCORE 6    02/26/2024    3:00 PM  Results of the Epworth flowsheet  Sitting and reading 0  Watching TV 3  Sitting, inactive in a public place (e.g. a theatre or a meeting) 0  As a passenger in a car for an hour without a break 2  Lying down to rest in the afternoon when circumstances permit 1  Sitting and talking to someone 0  Sitting quietly after a lunch without alcohol 0  In a car, while stopped for a few minutes in traffic 0  Total score 6      PAST MEDICAL HISTORY :   has a past medical history of Anxiety (02/25/2023), Blood transfusion without reported diagnosis, Chronic hypertension with superimposed preeclampsia (05/03/2021), Hypertension, Migraine (04/10/2020), PCOS (polycystic ovarian syndrome) (12/30/2023), Pregnancy induced hypertension, Severe pre-eclampsia (05/06/2021), and UTI (urinary tract infection).  has a past surgical history that includes Cesarean section (N/A, 05/06/2021) and Dilation and evacuation (N/A, 08/17/2023). Prior to Admission medications   Medication Sig Start Date End Date Taking? Authorizing Provider  amLODipine  (NORVASC ) 10 MG tablet Take 1 tablet (10 mg total) by mouth daily. 12/04/23    Roslyn Coombe, MD  ferrous sulfate  324 (65 Fe) MG TBEC Take 1 tablet (325 mg total) by mouth every other day. 09/08/23   Anyanwu, Ugonna A, MD  Prenatal Vit-Fe Fumarate-FA (PRENATAL MULTIVITAMIN) TABS tablet Take 1 tablet by mouth daily at 12 noon.    [provider]   No Known Allergies  FAMILY HISTORY:  family history includes Factor V Leiden deficiency in her mother; Hypertension in her father and maternal grandfather; Pulmonary embolism in her mother. SOCIAL HISTORY:  reports that she has never smoked. She has never used smokeless tobacco. She reports that she does not drink alcohol and does not use drugs.   Review of Systems:  Gen:  Denies  fever, sweats, chills weight loss  HEENT: Denies blurred vision, double vision, ear pain, eye pain, hearing loss, nose bleeds, sore throat Cardiac:  No dizziness, chest pain or heaviness, chest tightness,edema, No JVD Resp:   No cough, -sputum production, -shortness of breath,-wheezing, -hemoptysis,  Gi: Denies swallowing difficulty, stomach pain, nausea or vomiting, diarrhea, constipation, bowel incontinence Gu:  Denies bladder incontinence, burning urine Ext:   Denies Joint pain, stiffness or swelling Skin: Denies  skin rash, easy bruising or bleeding or hives Endoc:  Denies polyuria, polydipsia , polyphagia or weight change Psych:   Denies depression, insomnia or hallucinations  Other:  All other systems negative  VITAL SIGNS: BP 120/82 (BP Location: Right Arm, Cuff Size: Large)   Pulse 91   Temp (!) 97.1 F (36.2 C)   Ht 5\' 3"  (1.6 m)   Wt 266 lb 12.8 oz (121  kg)   SpO2 97%   BMI 47.26 kg/m    Physical Examination:   General Appearance: No distress  EYES PERRLA, EOM intact.   NECK Supple, No JVD Pulmonary: normal breath sounds, No wheezing.  CardiovascularNormal S1,S2.  No m/r/g.   Abdomen: Benign, Soft, non-tender. Skin:   warm, no rashes, no ecchymosis  Extremities: normal, no cyanosis, clubbing. Neuro:without  focal findings,  speech normal  PSYCHIATRIC: Mood, affect within normal limits.   ASSESSMENT AND PLAN  OSA I suspect that OSA is likely present due to clinical presentation. Discussed the consequences of untreated sleep apnea. Advised not to drive drowsy for safety of patient and others. Will complete further evaluation with a home sleep study and follow up to review results.    HTN Stable, on current management. Following with PCP.   Morbid obesity Counseled patient on diet and lifestyle modification.    MEDICATION ADJUSTMENTS/LABS AND TESTS ORDERED: Recommend Sleep Study   Patient  satisfied with Plan of action and management. All questions answered  Follow up to review HST results and treatment plan.   I spent a total of 21 minutes reviewing chart data, face-to-face evaluation with the patient, counseling and coordination of care as detailed above.    Mara Favero, M.D.  Sleep Medicine  Pulmonary & Critical Care Medicine

## 2024-02-26 NOTE — Patient Instructions (Signed)
 Bonnie Long

## 2024-02-27 ENCOUNTER — Ambulatory Visit: Admitting: Internal Medicine

## 2024-02-29 ENCOUNTER — Other Ambulatory Visit: Payer: Self-pay | Admitting: Internal Medicine

## 2024-02-29 DIAGNOSIS — I10 Essential (primary) hypertension: Secondary | ICD-10-CM

## 2024-03-01 ENCOUNTER — Other Ambulatory Visit: Payer: Self-pay

## 2024-03-01 DIAGNOSIS — N970 Female infertility associated with anovulation: Secondary | ICD-10-CM | POA: Insufficient documentation

## 2024-03-04 ENCOUNTER — Ambulatory Visit: Admitting: Internal Medicine

## 2024-03-23 ENCOUNTER — Telehealth: Payer: Self-pay | Admitting: Sleep Medicine

## 2024-03-23 NOTE — Telephone Encounter (Signed)
 Needs to be scheduled with Dr. Jess in next 2-3 weeks for HST results

## 2024-03-25 ENCOUNTER — Ambulatory Visit: Admitting: Internal Medicine

## 2024-03-25 ENCOUNTER — Ambulatory Visit

## 2024-03-25 ENCOUNTER — Ambulatory Visit: Payer: Self-pay | Admitting: Internal Medicine

## 2024-03-25 ENCOUNTER — Encounter: Payer: Self-pay | Admitting: Internal Medicine

## 2024-03-25 VITALS — BP 144/100 | HR 88 | Temp 98.1°F | Ht 63.0 in | Wt 269.0 lb

## 2024-03-25 DIAGNOSIS — M545 Low back pain, unspecified: Secondary | ICD-10-CM | POA: Diagnosis not present

## 2024-03-25 DIAGNOSIS — D509 Iron deficiency anemia, unspecified: Secondary | ICD-10-CM | POA: Diagnosis not present

## 2024-03-25 DIAGNOSIS — I10 Essential (primary) hypertension: Secondary | ICD-10-CM

## 2024-03-25 DIAGNOSIS — M542 Cervicalgia: Secondary | ICD-10-CM | POA: Diagnosis not present

## 2024-03-25 DIAGNOSIS — E282 Polycystic ovarian syndrome: Secondary | ICD-10-CM | POA: Diagnosis not present

## 2024-03-25 DIAGNOSIS — Z3201 Encounter for pregnancy test, result positive: Secondary | ICD-10-CM | POA: Diagnosis not present

## 2024-03-25 DIAGNOSIS — G8929 Other chronic pain: Secondary | ICD-10-CM

## 2024-03-25 LAB — URINALYSIS, ROUTINE W REFLEX MICROSCOPIC
Bilirubin Urine: NEGATIVE
Hgb urine dipstick: NEGATIVE
Ketones, ur: NEGATIVE
Leukocytes,Ua: NEGATIVE
Nitrite: NEGATIVE
Specific Gravity, Urine: 1.01 (ref 1.000–1.030)
Total Protein, Urine: NEGATIVE
Urine Glucose: NEGATIVE
Urobilinogen, UA: 0.2 (ref 0.0–1.0)
pH: 6 (ref 5.0–8.0)

## 2024-03-25 LAB — IBC PANEL
Iron: 66 ug/dL (ref 42–145)
Saturation Ratios: 16.7 % — ABNORMAL LOW (ref 20.0–50.0)
TIBC: 396.2 ug/dL (ref 250.0–450.0)
Transferrin: 283 mg/dL (ref 212.0–360.0)

## 2024-03-25 LAB — CBC WITH DIFFERENTIAL/PLATELET
Basophils Absolute: 0 10*3/uL (ref 0.0–0.1)
Basophils Relative: 0.5 % (ref 0.0–3.0)
Eosinophils Absolute: 0.1 10*3/uL (ref 0.0–0.7)
Eosinophils Relative: 0.9 % (ref 0.0–5.0)
HCT: 42.5 % (ref 36.0–46.0)
Hemoglobin: 13.9 g/dL (ref 12.0–15.0)
Lymphocytes Relative: 35.2 % (ref 12.0–46.0)
Lymphs Abs: 3.3 10*3/uL (ref 0.7–4.0)
MCHC: 32.7 g/dL (ref 30.0–36.0)
MCV: 80.3 fl (ref 78.0–100.0)
Monocytes Absolute: 0.6 10*3/uL (ref 0.1–1.0)
Monocytes Relative: 6.1 % (ref 3.0–12.0)
Neutro Abs: 5.3 10*3/uL (ref 1.4–7.7)
Neutrophils Relative %: 57.3 % (ref 43.0–77.0)
Platelets: 335 10*3/uL (ref 150.0–400.0)
RBC: 5.29 Mil/uL — ABNORMAL HIGH (ref 3.87–5.11)
RDW: 13.9 % (ref 11.5–15.5)
WBC: 9.3 10*3/uL (ref 4.0–10.5)

## 2024-03-25 LAB — HEPATIC FUNCTION PANEL
ALT: 14 U/L (ref 0–35)
AST: 18 U/L (ref 0–37)
Albumin: 4.3 g/dL (ref 3.5–5.2)
Alkaline Phosphatase: 62 U/L (ref 39–117)
Bilirubin, Direct: 0.1 mg/dL (ref 0.0–0.3)
Total Bilirubin: 0.7 mg/dL (ref 0.2–1.2)
Total Protein: 7.8 g/dL (ref 6.0–8.3)

## 2024-03-25 LAB — BASIC METABOLIC PANEL WITH GFR
BUN: 16 mg/dL (ref 6–23)
CO2: 29 meq/L (ref 19–32)
Calcium: 9.2 mg/dL (ref 8.4–10.5)
Chloride: 100 meq/L (ref 96–112)
Creatinine, Ser: 1.17 mg/dL (ref 0.40–1.20)
GFR: 64.41 mL/min (ref >60.00)
Glucose, Bld: 94 mg/dL (ref 70–99)
Potassium: 3.4 meq/L — ABNORMAL LOW (ref 3.5–5.1)
Sodium: 136 meq/L (ref 135–145)

## 2024-03-25 LAB — FERRITIN: Ferritin: 42.1 ng/mL (ref 10.0–291.0)

## 2024-03-25 LAB — HCG, QUANTITATIVE, PREGNANCY: Quantitative HCG: <0.6 m[IU]/mL

## 2024-03-25 MED ORDER — CELECOXIB 200 MG PO CAPS: 200.0000 mg | ORAL_CAPSULE | Freq: Two times a day (BID) | ORAL | 2 refills | Status: DC | PRN

## 2024-03-25 MED ORDER — LABETALOL HCL 100 MG PO TABS: 100.0000 mg | ORAL_TABLET | Freq: Two times a day (BID) | ORAL | 3 refills | Status: AC

## 2024-03-25 NOTE — Progress Notes (Signed)
 Patient ID: Bonnie Long, female   DOB: [birth year], 28 y.o.   MRN: [id number]        Chief Complaint: follow up iron  deficiency anemia, htn, possible pos preg test and hx of PCOS, chronic LBP and post left neck pain       HPI:  Bonnie Long is a 28 y.o. female here with c/o 3 days onset post left neck pain mild to mod, without fever, falls or radiation.  Also with 1 wk left lower back pain without radiation as well, worse to stand up or bend.  Pt denies chest pain, increased sob or doe, wheezing, orthopnea, PND, increased LE swelling, palpitations, dizziness or syncope.   Pt denies polydipsia, polyuria, or new focal neuro s/s.   Pt asking for pregnancy testing.        Wt Readings from Last 3 Encounters:  03/25/24 269 lb (122 kg)  02/26/24 266 lb 12.8 oz (121 kg)  12/24/23 267 lb (121.1 kg)   BP Readings from Last 3 Encounters:  03/25/24 (!) 144/100  02/26/24 120/82  01/05/24 (!) 133/93         Past Medical History:  Diagnosis Date   Anxiety 02/25/2023   Blood transfusion without reported diagnosis    Chronic hypertension with superimposed preeclampsia 05/03/2021   Hypertension    Migraine 04/10/2020   PCOS (polycystic ovarian syndrome) 12/30/2023   Pregnancy induced hypertension    Severe pre-eclampsia 05/06/2021   UTI (urinary tract infection)    Past Surgical History:  Procedure Laterality Date   CESAREAN SECTION N/A 05/06/2021   Procedure: CESAREAN SECTION;  Surgeon: Bonnie Harari, MD;  Location: [facility];  Service: Obstetrics;  Laterality: N/A;   DILATION AND EVACUATION N/A 08/17/2023   Procedure: DILATATION AND EVACUATION;  Surgeon: Bonnie Bonnie MATSU, MD;  Location: [facility] OR;  Service: Gynecology;  Laterality: N/A;    reports that she has never smoked. She has never used smokeless tobacco. She reports that she does not drink alcohol and does not use drugs. family history includes Factor V Leiden deficiency in her mother; Hypertension in her father and maternal  grandfather; Pulmonary embolism in her mother. No Known Allergies Current Outpatient Medications on File Prior to Visit  Medication Sig Dispense Refill   amLODipine  (NORVASC ) 10 MG tablet TAKE 1 TABLET BY MOUTH EVERY DAY 90 tablet 0   ferrous sulfate  324 (65 Fe) MG TBEC Take 1 tablet (325 mg total) by mouth every other day. (Patient not taking: Reported on 03/25/2024) 30 tablet 2   Prenatal Vit-Fe Fumarate-FA (PRENATAL MULTIVITAMIN) TABS tablet Take 1 tablet by mouth daily at 12 noon. (Patient not taking: Reported on 03/25/2024)     Current Facility-Administered Medications on File Prior to Visit  Medication Dose Route Frequency Provider Last Rate Last Admin   acetaminophen  (TYLENOL ) tablet 650 mg  650 mg Oral Once Bonnie Bonnie ORN, MD       diphenhydrAMINE  (BENADRYL ) capsule 25 mg  25 mg Oral Once Bonnie Bonnie ORN, MD       iron  sucrose (VENOFER ) injection 200 mg  200 mg Intravenous Once Bonnie Bonnie ORN, MD            ROS:  All others reviewed and negative.  Objective        PE:  BP (!) 144/100 (BP Location: Left Arm, Patient Position: Sitting)   Pulse 88   Temp 98.1 F (36.7 C) (Oral)   Ht 5' 3 (1.6 m)   Wt 269 lb (122  kg)   LMP 02/26/2024   SpO2 98%   BMI 47.65 kg/m                 Constitutional: Pt appears in NAD               HENT: Head: NCAT.                Right Ear: External ear normal.                 Left Ear: External ear normal.                Eyes: . Pupils are equal, round, and reactive to light. Conjunctivae and EOM are normal               Nose: without d/c or deformity               Neck: Neck supple. Gross normal ROM               Cardiovascular: Normal rate and regular rhythm.                 Pulmonary/Chest: Effort normal and breath sounds without rales or wheezing.                Abd:  Soft, NT, ND, + BS, no organomegaly               Neurological: Pt is alert. At baseline orientation, motor grossly intact               Skin: Skin is warm. No rashes, no other new  lesions, LE edema - none               Psychiatric: Pt behavior is normal without agitation   Micro: none  Cardiac tracings I have personally interpreted today:  none  Pertinent Radiological findings (summarize): none   Lab Results  Component Value Date   WBC 9.3 03/25/2024   HGB 13.9 03/25/2024   HCT 42.5 03/25/2024   PLT 335.0 03/25/2024   GLUCOSE 94 03/25/2024   CHOL 158 12/11/2023   TRIG 222.0 (H) 12/11/2023   HDL 36.40 (L) 12/11/2023   LDLCALC 77 12/11/2023   ALT 14 03/25/2024   AST 18 03/25/2024   NA 136 03/25/2024   K 3.4 (L) 03/25/2024   CL 100 03/25/2024   CREATININE 1.17 03/25/2024   BUN 16 03/25/2024   CO2 29 03/25/2024   TSH 2.45 12/11/2023   HGBA1C 5.6 12/11/2023   Assessment/Plan:  Bonnie Long is a 28 y.o. Black or African American [2] female with  has a past medical history of Anxiety (02/25/2023), Blood transfusion without reported diagnosis, Chronic hypertension with superimposed preeclampsia (05/03/2021), Hypertension, Migraine (04/10/2020), PCOS (polycystic ovarian syndrome) (12/30/2023), Pregnancy induced hypertension, Severe pre-eclampsia (05/06/2021), and UTI (urinary tract infection).  Iron  deficiency anemia Has improved menses recently, for f/u lab today  Hypertension BP Readings from Last 3 Encounters:  03/25/24 (!) 144/100  02/26/24 120/82  01/05/24 (!) 133/93   uncontrolled, pt to start labetolol 100 mg bid, f/u bp at home and next visit   Positive pregnancy test Possible per pt - for f/u BHCG today  PCOS (polycystic ovarian syndrome) For f/u GYN as planned  Chronic bilateral low back pain without sciatica Mild to mod, for xray r/o acute problem, but also nsaid prn if preg test negative  Posterior neck pain Exam benign, for cervical spine films r/o acute, also nsaid prn  Followup: Return in about 4 weeks (around 04/22/2024).  Bonnie Rush, MD 03/28/2024 1:41 PM [facility]

## 2024-03-25 NOTE — Patient Instructions (Addendum)
 Please take all new medication as prescribed - the anti inflammatory for pain, and labetolol 100 mg twice per day for BP  Please continue all other medications as before, including the amlodipine  10 mg   Please have the pharmacy call with any other refills you may need.  Please keep your appointments with your specialists as you may have planned  Please go to the XRAY Department in the first floor for the x-ray testing  Please go to the LAB at the blood drawing area for the tests to be done  You will be contacted by phone if any changes need to be made immediately.  Otherwise, you will receive a letter about your results with an explanation, but please check with MyChart first.  Please make an Appointment to return in 1 months, or sooner if needed

## 2024-03-26 ENCOUNTER — Encounter

## 2024-03-26 DIAGNOSIS — G4733 Obstructive sleep apnea (adult) (pediatric): Secondary | ICD-10-CM

## 2024-03-28 ENCOUNTER — Encounter: Payer: Self-pay | Admitting: Internal Medicine

## 2024-03-28 DIAGNOSIS — M542 Cervicalgia: Secondary | ICD-10-CM | POA: Insufficient documentation

## 2024-03-28 NOTE — Assessment & Plan Note (Signed)
 Exam benign, for cervical spine films r/o acute, also nsaid prn

## 2024-03-28 NOTE — Assessment & Plan Note (Signed)
 BP Readings from Last 3 Encounters:  03/25/24 (!) 144/100  02/26/24 120/82  01/05/24 (!) 133/93   uncontrolled, pt to start labetolol 100 mg bid, f/u bp at home and next visit

## 2024-03-28 NOTE — Assessment & Plan Note (Signed)
 Mild to mod, for xray r/o acute problem, but also nsaid prn if preg test negative

## 2024-03-28 NOTE — Assessment & Plan Note (Signed)
 Has improved menses recently, for f/u lab today

## 2024-03-28 NOTE — Assessment & Plan Note (Signed)
 For f/u GYN as planned

## 2024-03-28 NOTE — Assessment & Plan Note (Signed)
 Possible per pt - for f/u [facility] today

## 2024-03-29 ENCOUNTER — Other Ambulatory Visit (HOSPITAL_COMMUNITY): Payer: Self-pay

## 2024-03-29 ENCOUNTER — Telehealth: Payer: Self-pay

## 2024-03-29 MED ORDER — TIRZEPATIDE 2.5 MG/0.5ML ~~LOC~~ SOAJ: 2.5000 mg | SUBCUTANEOUS | 11 refills | Status: DC

## 2024-03-29 NOTE — Telephone Encounter (Signed)
 Pharmacy Patient Advocate Encounter   Received notification from CoverMyMeds that prior authorization for Mounjaro 2.5 is required/requested.   Unable to verify patient insurance through [place], [place], or patient insurance card in media. The patient media shows insured through [employer] .   Per test claim: patient insurance terminated. Unable to initiate prior authorization until insurance verified.

## 2024-03-31 ENCOUNTER — Encounter: Payer: Self-pay | Admitting: Internal Medicine

## 2024-03-31 NOTE — Telephone Encounter (Signed)
 Called and spoke with Pt and she will be sending a picture of her insurance card via Mychart.

## 2024-04-15 DIAGNOSIS — G4733 Obstructive sleep apnea (adult) (pediatric): Secondary | ICD-10-CM | POA: Diagnosis not present

## 2024-04-16 ENCOUNTER — Ambulatory Visit: Payer: Self-pay

## 2024-04-16 ENCOUNTER — Encounter: Payer: Self-pay | Admitting: Obstetrics & Gynecology

## 2024-04-16 DIAGNOSIS — G4733 Obstructive sleep apnea (adult) (pediatric): Secondary | ICD-10-CM

## 2024-04-22 ENCOUNTER — Ambulatory Visit: Admitting: Sleep Medicine

## 2024-04-23 ENCOUNTER — Ambulatory Visit: Admitting: Family Medicine

## 2024-04-23 ENCOUNTER — Encounter: Payer: Self-pay | Admitting: Family Medicine

## 2024-04-27 ENCOUNTER — Ambulatory Visit: Admitting: Obstetrics & Gynecology

## 2024-05-30 ENCOUNTER — Other Ambulatory Visit: Payer: Self-pay | Admitting: Internal Medicine

## 2024-05-30 DIAGNOSIS — I10 Essential (primary) hypertension: Secondary | ICD-10-CM

## 2024-06-01 ENCOUNTER — Ambulatory Visit: Admitting: Sleep Medicine

## 2024-06-07 ENCOUNTER — Encounter: Payer: Self-pay | Admitting: Obstetrics and Gynecology

## 2024-06-22 ENCOUNTER — Ambulatory Visit (INDEPENDENT_AMBULATORY_CARE_PROVIDER_SITE_OTHER): Admitting: Obstetrics and Gynecology

## 2024-06-22 ENCOUNTER — Encounter: Payer: Self-pay | Admitting: Obstetrics and Gynecology

## 2024-06-22 VITALS — BP 129/84 | HR 73 | Ht 63.0 in | Wt 272.0 lb

## 2024-06-22 DIAGNOSIS — E282 Polycystic ovarian syndrome: Secondary | ICD-10-CM

## 2024-06-22 DIAGNOSIS — Z8759 Personal history of other complications of pregnancy, childbirth and the puerperium: Secondary | ICD-10-CM

## 2024-06-22 DIAGNOSIS — Z3169 Encounter for other general counseling and advice on procreation: Secondary | ICD-10-CM

## 2024-06-22 DIAGNOSIS — Z8249 Family history of ischemic heart disease and other diseases of the circulatory system: Secondary | ICD-10-CM

## 2024-06-22 DIAGNOSIS — G4733 Obstructive sleep apnea (adult) (pediatric): Secondary | ICD-10-CM | POA: Diagnosis not present

## 2024-06-22 DIAGNOSIS — D6859 Other primary thrombophilia: Secondary | ICD-10-CM

## 2024-06-22 DIAGNOSIS — I10 Essential (primary) hypertension: Secondary | ICD-10-CM

## 2024-06-22 DIAGNOSIS — Z6841 Body Mass Index (BMI) 40.0 and over, adult: Secondary | ICD-10-CM | POA: Insufficient documentation

## 2024-06-22 DIAGNOSIS — R7989 Other specified abnormal findings of blood chemistry: Secondary | ICD-10-CM | POA: Diagnosis not present

## 2024-06-22 DIAGNOSIS — R718 Other abnormality of red blood cells: Secondary | ICD-10-CM | POA: Insufficient documentation

## 2024-06-22 NOTE — Progress Notes (Unsigned)
 Obstetrics and Gynecology Established Patient Evaluation  Appointment Date: 06/22/2024  OBGYN Clinic: [facility]   Primary Care Provider: Norleen Lynwood ORN  Chief Complaint:  Chief Complaint  Patient presents with   Follow-up    Trying to conceive with PCOS     History of Present Illness: Bonnie Long is a 28 y.o.  G2P1011 (Patient's last menstrual period was 06/07/2024 (exact date).), seen for the above chief complaint.   Patient interested in conceiving. Last pregnancy late 2024 but was a missed AB at 12 weeks with subsequent d&c.   Patient states she's having a period about every 26 days that are approximately 5 days. Same partner as with last two pregnancies who is health in his late 26s.    Review of Systems: Pertinent items are noted in HPI.   Patient Active Problem List   Diagnosis Date Noted   OSA (obstructive sleep apnea) 06/22/2024   Elevated serum creatinine 06/22/2024   BMI 45.0-49.9, adult (HCC) 06/22/2024   Elevated red blood cell count 06/22/2024   Posterior neck pain 03/28/2024   PCOS (polycystic ovarian syndrome) 12/30/2023   Chronic bilateral low back pain without sciatica 12/13/2023   Encounter for well adult exam with abnormal findings 10/07/2023   History of iron  deficiency anemia 10/07/2023   Family history of thrombosis 09/04/2023   Elevated factor 8 08/27/2023   Vitamin D  deficiency 04/10/2020   Hypertension 02/11/2018   Past Medical History:  Past Medical History:  Diagnosis Date   Anxiety 02/25/2023   Blood transfusion without reported diagnosis    Chronic hypertension with superimposed preeclampsia 05/03/2021   Elevated serum creatinine    Hypertension    Migraine 04/10/2020   PCOS (polycystic ovarian syndrome) 12/30/2023   Prediabetes 10/08/2023   Pregnancy induced hypertension    Severe pre-eclampsia 05/06/2021   UTI (urinary tract infection)    Past Surgical History:  Past Surgical History:   Procedure Laterality Date   CESAREAN SECTION N/A 05/06/2021   Procedure: CESAREAN SECTION;  Surgeon: Izell Harari, MD;  Location: [facility] OR[facility];  Service: Obstetrics;  Laterality: N/A;   DILATION AND EVACUATION N/A 08/17/2023   Procedure: DILATATION AND EVACUATION;  Surgeon: Eveline Lynwood MATSU, MD;  Location: [facility] OR;  Service: Gynecology;  Laterality: N/A;   Past Obstetrical History:  OB History  Gravida Para Term Preterm AB Living  2 1 1  0 1 1  SAB IAB Ectopic Multiple Live Births  1 0 0 0 1    # Outcome Date GA Lbr Len/2nd Weight Sex Type Anes PTL Lv  2 SAB 08/17/23 [redacted]w[redacted]d         1 Term 05/06/21 [redacted]w[redacted]d  6 lb 7.2 oz (2.925 kg) M CS-LTranv EPI  LIV    Obstetric Comments  Pre-e, failed induction with first, PPH   Past Gynecological History: As per HPI. History of Pap Smear(s): Yes.   Last pap Jan 2025, which was negative.  She is currently using no method for contraception.   Social History:  Social History   Socioeconomic History   Marital status: Married    Spouse name: Not on file   Number of children: Not on file   Years of education: Not on file   Highest education level: Some college, no degree  Occupational History   Not on file  Tobacco Use   Smoking status: Never   Smokeless tobacco: Never  Vaping Use   Vaping status: Never Used  Substance and Sexual Activity   Alcohol  use: No   Drug use: No   Sexual activity: Not Currently    Partners: Male    Birth control/protection: None  Other Topics Concern   Not on file  Social History Narrative   Not on file   Social Drivers of Health   Financial Resource Strain: Low Risk  (03/24/2024)   Overall Financial Resource Strain (CARDIA)    Difficulty of Paying Living Expenses: Not very hard  Food Insecurity: No Food Insecurity (03/24/2024)   Hunger Vital Sign    Worried About Running Out of Food in the Last Year: Never true    Ran Out of Food in the Last Year: Never true  Transportation Needs: No Transportation Needs  (03/24/2024)   PRAPARE - Administrator, Civil Service (Medical): No    Lack of Transportation (Non-Medical): No  Physical Activity: Insufficiently Active (03/24/2024)   Exercise Vital Sign    Days of Exercise per Week: 2 days    Minutes of Exercise per Session: 30 min  Stress: Stress Concern Present (03/24/2024)   [employer] of Occupational Health - Occupational Stress Questionnaire    Feeling of Stress: To some extent  Social Connections: Moderately Integrated (03/24/2024)   Social Connection and Isolation Panel    Frequency of Communication with Friends and Family: More than three times a week    Frequency of Social Gatherings with Friends and Family: Twice a week    Attends Religious Services: More than 4 times per year    Active Member of [employer] or Organizations: No    Attends Banker Meetings: Not on file    Marital Status: Married  Catering manager Violence: Not At Risk (06/21/2021)   Humiliation, Afraid, Rape, and Kick questionnaire    Fear of Current or Ex-Partner: No    Emotionally Abused: No    Physically Abused: No    Sexually Abused: No   Family History:  Family History  Problem Relation Age of Onset   Pulmonary embolism Mother    Factor V Leiden deficiency Mother    Hypertension Father    Hypertension Maternal Grandfather     Medications [name] had no medications administered during this visit. Current Outpatient Medications  Medication Sig Dispense Refill   amLODipine  (NORVASC ) 10 MG tablet TAKE 1 TABLET BY MOUTH EVERY DAY 30 tablet 2   labetalol  (NORMODYNE ) 100 MG tablet Take 1 tablet (100 mg total) by mouth 2 (two) times daily. 180 tablet 3   No current facility-administered medications for this visit.   Facility-Administered Medications Ordered in Other Visits  Medication Dose Route Frequency Provider Last Rate Last Admin   acetaminophen  (TYLENOL ) tablet 650 mg  650 mg Oral Once Norleen Lynwood ORN, MD        diphenhydrAMINE  (BENADRYL ) capsule 25 mg  25 mg Oral Once Norleen Lynwood ORN, MD       iron  sucrose (VENOFER ) injection 200 mg  200 mg Intravenous Once Norleen Lynwood ORN, MD       Allergies Patient has no known allergies.  Physical Exam:  BP 129/84 (BP Location: Left Arm, Patient Position: Sitting, Cuff Size: Large)   Pulse 73   Ht 5' 3 (1.6 m)   Wt 272 lb (123.4 kg)   LMP 06/07/2024 (Exact Date)   BMI 48.18 kg/m  Body mass index is 48.18 kg/m. General appearance: Well nourished, well developed female in no acute distress.  Respiratory:  Normal respiratory effort Neuro/Psych:  Normal mood and affect.   Laboratory:  Latest Ref Rng & Units 03/25/2024    2:27 PM 12/11/2023    2:27 PM 10/07/2023    9:27 AM  CBC  WBC 4.0 - 10.5 K/uL 9.3  7.1  8.0   Hemoglobin 12.0 - 15.0 g/dL 86.0  86.7  88.6   Hematocrit 36.0 - 46.0 % 42.5  41.1  35.8   Platelets 150.0 - 400.0 K/uL 335.0  345.0  475.0       Latest Ref Rng & Units 03/25/2024    2:27 PM 12/11/2023    2:27 PM 10/07/2023    9:27 AM  CMP  Glucose 70 - 99 mg/dL 94  83  91   BUN 6 - 23 mg/dL 16  13  15    Creatinine 0.40 - 1.20 mg/dL 8.82  8.88  8.94   Sodium 135 - 145 mEq/L 136  137  138   Potassium 3.5 - 5.1 mEq/L 3.4  3.8  4.0   Chloride 96 - 112 mEq/L 100  100  101   CO2 19 - 32 mEq/L 29  29  30    Calcium 8.4 - 10.5 mg/dL 9.2  9.4  9.2   Total Protein 6.0 - 8.3 g/dL 7.8  7.7  8.0   Total Bilirubin 0.2 - 1.2 mg/dL 0.7  0.5  0.3   Alkaline Phos 39 - 117 U/L 62  61  66   AST 0 - 37 U/L 18  16  17    ALT 0 - 35 U/L 14  15  12     Radiology: no new imaging  Assessment: patient stable  Plan: 1. PCOS (polycystic ovarian syndrome) (Primary) ***  2. OSA (obstructive sleep apnea) Just diagnosed this summer. Patient states she just got her machine. Importance of using this and how it can help with a multitude of issues especially OB-wise  3. Primary hypertension No issues with norvasc  and labetalol . Referral to [facility] cards made. I did d/w  her risk of worsening BP and CV disease with pregnancy.  H/o 2022 pre-eclampsia which led to her ongoing kidney issues.  - Ambulatory referral to Cardio Obstetrics  4. Elevated serum creatinine Started with her 37wk IOL due to severe pre-eclampsia. She states she's never seen a kidney specialist. Referral made and pt to let me know if she hasn't heard about an appointment in the next few weeks. Risk of worsening kidney disease with pregnancy d/w her - Ambulatory referral to Nephrology  5. BMI 45.0-49.9, adult (HCC) See above  6. Elevated red blood cell count See below  7. Family history of thrombosis See below  8. Elevated factor 8 ***  9. History of severe pre-eclampsia - Ambulatory referral to Nephrology  10. Pre-conception counseling ***  Orders Placed This Encounter  Procedures   Ambulatory referral to Nephrology   AMB Referral to Cardio Obstetrics    Return in about 3 months (around 09/21/2024) for in person.  Future Appointments  Date Time Provider Department Center  07/06/2024 10:30 AM Jess Devona BIRCH, MD [id number]    Bebe Izell Raddle MD Attending [facility])

## 2024-07-06 ENCOUNTER — Encounter: Payer: Self-pay | Admitting: Sleep Medicine

## 2024-07-06 ENCOUNTER — Ambulatory Visit (INDEPENDENT_AMBULATORY_CARE_PROVIDER_SITE_OTHER): Admitting: Sleep Medicine

## 2024-07-06 VITALS — BP 100/80 | HR 59 | Temp 99.1°F | Ht 63.0 in | Wt 276.0 lb

## 2024-07-06 DIAGNOSIS — I10 Essential (primary) hypertension: Secondary | ICD-10-CM

## 2024-07-06 DIAGNOSIS — Z6841 Body Mass Index (BMI) 40.0 and over, adult: Secondary | ICD-10-CM

## 2024-07-06 DIAGNOSIS — G4733 Obstructive sleep apnea (adult) (pediatric): Secondary | ICD-10-CM | POA: Diagnosis not present

## 2024-07-06 NOTE — Patient Instructions (Signed)

## 2024-07-06 NOTE — Progress Notes (Signed)
 Name:Lashone DEZIREA MCCOLLISTER MRN: [id number] DOB: [birth year]   CHIEF COMPLAINT:  CPAP F/U   HISTORY OF PRESENT ILLNESS:  Bonnie Long is a 28 y.o. w/ a h/o OSA, HTN and morbid obesity who presents for CPAP F/U visit. Reports using CPAP every night, which is confirmed by compliance data. She is currently using the Airfit N30i nasal mask, which is comfortable. Reports feeling significantly more refreshed upon awakening with CPAP therapy.    EPWORTH SLEEP SCORE 6    02/26/2024    3:00 PM  Results of the Epworth flowsheet  Sitting and reading 0  Watching TV 3  Sitting, inactive in a public place (e.g. a theatre or a meeting) 0  As a passenger in a car for an hour without a break 2  Lying down to rest in the afternoon when circumstances permit 1  Sitting and talking to someone 0  Sitting quietly after a lunch without alcohol 0  In a car, while stopped for a few minutes in traffic 0  Total score 6    PAST MEDICAL HISTORY :   has a past medical history of Anemia (08/17/2023), Anxiety (02/25/2023), Blood transfusion without reported diagnosis (05/07/2021), Chronic hypertension with superimposed preeclampsia (05/03/2021), Elevated serum creatinine, GERD (gastroesophageal reflux disease) (09/2022), Hypertension (2021), Migraine (04/10/2020), PCOS (polycystic ovarian syndrome) (12/30/2023), Prediabetes (10/08/2023), Pregnancy induced hypertension, Severe pre-eclampsia (05/06/2021), and UTI (urinary tract infection).  has a past surgical history that includes Cesarean section (N/A, 05/06/2021) and Dilation and evacuation (N/A, 08/17/2023). Prior to Admission medications   Medication Sig Start Date End Date Taking? Authorizing Provider  amLODipine  (NORVASC ) 10 MG tablet Take 1 tablet (10 mg total) by mouth daily. 12/04/23   Norleen Lynwood ORN, MD  ferrous sulfate  324 (65 Fe) MG TBEC Take 1 tablet (325 mg total) by mouth every other day. 09/08/23   Anyanwu, Ugonna A, MD  Prenatal Vit-Fe Fumarate-FA  (PRENATAL MULTIVITAMIN) TABS tablet Take 1 tablet by mouth daily at 12 noon.    [provider]   No Known Allergies  FAMILY HISTORY:  family history includes Diabetes in her mother; Factor V Leiden deficiency in her mother; Hypertension in her father, maternal grandfather, paternal grandfather, and paternal grandmother; Pulmonary embolism in her mother. SOCIAL HISTORY:  reports that she has never smoked. She has never used smokeless tobacco. She reports that she does not drink alcohol and does not use drugs.   Review of Systems:  Gen:  Denies  fever, sweats, chills weight loss  HEENT: Denies blurred vision, double vision, ear pain, eye pain, hearing loss, nose bleeds, sore throat Cardiac:  No dizziness, chest pain or heaviness, chest tightness,edema, No JVD Resp:   No cough, -sputum production, -shortness of breath,-wheezing, -hemoptysis,  Gi: Denies swallowing difficulty, stomach pain, nausea or vomiting, diarrhea, constipation, bowel incontinence Gu:  Denies bladder incontinence, burning urine Ext:   Denies Joint pain, stiffness or swelling Skin: Denies  skin rash, easy bruising or bleeding or hives Endoc:  Denies polyuria, polydipsia , polyphagia or weight change Psych:   Denies depression, insomnia or hallucinations  Other:  All other systems negative  VITAL SIGNS: BP 100/80   Pulse (!) 59   Temp 99.1 F (37.3 C)   Ht 5' 3 (1.6 m)   Wt 276 lb (125.2 kg)   LMP 06/07/2024 (Exact Date)   SpO2 98%   BMI 48.89 kg/m    Physical Examination:   General Appearance: No distress  EYES PERRLA, EOM intact.  NECK Supple, No JVD Pulmonary: normal breath sounds, No wheezing.  CardiovascularNormal S1,S2.  No m/r/g.   Abdomen: Benign, Soft, non-tender. Skin:   warm, no rashes, no ecchymosis  Extremities: normal, no cyanosis, clubbing. Neuro:without focal findings,  speech normal  PSYCHIATRIC: Mood, affect within normal limits.   ASSESSMENT AND PLAN  OSA Patient is  using and benefiting from CPAP therapy. Discussed the consequences of untreated sleep apnea. Advised not to drive drowsy for safety of patient and others. Will follow up in 3 months.   HTN Stable, on current management. Following with PCP.   Morbid obesity Counseled patient on diet and lifestyle modification.    Patient  satisfied with Plan of action and management. All questions answered  I spent a total of 36 minutes reviewing chart data, face-to-face evaluation with the patient, counseling and coordination of care as detailed above.    Jaymere Alen, M.D.  Sleep Medicine [facility]

## 2024-07-12 ENCOUNTER — Encounter: Payer: Self-pay | Admitting: Physician Assistant

## 2024-07-12 ENCOUNTER — Ambulatory Visit: Admitting: Physician Assistant

## 2024-07-12 ENCOUNTER — Telehealth: Payer: Self-pay

## 2024-07-12 VITALS — BP 128/90 | HR 79 | Resp 16 | Ht 63.0 in | Wt 277.1 lb

## 2024-07-12 DIAGNOSIS — E282 Polycystic ovarian syndrome: Secondary | ICD-10-CM | POA: Diagnosis not present

## 2024-07-12 DIAGNOSIS — Z862 Personal history of diseases of the blood and blood-forming organs and certain disorders involving the immune mechanism: Secondary | ICD-10-CM

## 2024-07-12 DIAGNOSIS — Z3A01 Less than 8 weeks gestation of pregnancy: Secondary | ICD-10-CM | POA: Insufficient documentation

## 2024-07-12 DIAGNOSIS — G4733 Obstructive sleep apnea (adult) (pediatric): Secondary | ICD-10-CM | POA: Diagnosis not present

## 2024-07-12 DIAGNOSIS — D6859 Other primary thrombophilia: Secondary | ICD-10-CM | POA: Diagnosis not present

## 2024-07-12 DIAGNOSIS — M542 Cervicalgia: Secondary | ICD-10-CM

## 2024-07-12 DIAGNOSIS — I10 Essential (primary) hypertension: Secondary | ICD-10-CM | POA: Diagnosis not present

## 2024-07-12 NOTE — Progress Notes (Signed)
 New patient visit  Patient: Bonnie Long   DOB: 10/28/1995   28 y.o. Female  MRN: [id number] Visit Date: 07/12/2024  Today's healthcare provider: Jolynn Spencer, PA-C   Chief Complaint  Patient presents with   New Patient (Initial Visit)    Last PCP: Dr. Lynwood Rush last visit with him was in June Pt just found she is pregnant last week   Weight Loss    Insurance did not approve medication of Mounjaro  was not able to start. Pt has tried exercising: cardio 3 times a week for 30 minutes Dieting: low carb diet   Subjective    Bonnie Long is a 28 y.o. female who presents today as a new patient to establish care.   Discussed the use of AI scribe software for clinical note transcription with the patient, who gave verbal consent to proceed.  History of Present Illness Bonnie Long is a 28 year old female who presents with a new pregnancy.  She estimates her gestation at about four weeks, with her last menstrual period on June 07, 2024. An OBGYN appointment is scheduled for July 27, 2024. She has a history of miscarriage in November of the previous year but is not experiencing any bleeding or significant pain related to the pregnancy. Knee pain is present, which she attributes to hormonal changes.  Hypertension is managed with amlodipine  and labetalol , taken twice daily. During her previous pregnancy, her medication was switched to nifedipine . She uses a CPAP machine for obstructive sleep apnea, which has helped lower her blood pressure.  She has polycystic ovarian syndrome (PCOS), which she believes contributes to weight gain. There are no current issues with seizures, chest pain, shortness of breath, or swelling. Iron  deficiency and elevated factor VIII are noted in her history.    Past Medical History:  Diagnosis Date   Anemia 08/17/2023   Anxiety 02/25/2023   Have had it since becoming a mom   Blood transfusion without reported diagnosis 05/07/2021   Had csection on  05/06/21 and hemorrhaged. Had to have 2 blood infusions   Chronic hypertension with superimposed preeclampsia 05/03/2021   Elevated serum creatinine    GERD (gastroesophageal reflux disease) 09/2022   Had during pregnancy and still have it   Hypertension 2021   Migraine 04/10/2020   PCOS (polycystic ovarian syndrome) 12/30/2023   Prediabetes 10/08/2023   Pregnancy induced hypertension    Severe pre-eclampsia 05/06/2021   UTI (urinary tract infection)    Past Surgical History:  Procedure Laterality Date   CESAREAN SECTION N/A 05/06/2021   Procedure: CESAREAN SECTION;  Surgeon: Izell Harari, MD;  Location: [facility] ORS;  Service: Obstetrics;  Laterality: N/A;   DILATION AND EVACUATION N/A 08/17/2023   Procedure: DILATATION AND EVACUATION;  Surgeon: Eveline Lynwood MATSU, MD;  Location: [facility];  Service: Gynecology;  Laterality: N/A;   Family Status  Relation Name Status   Mother Lawanda Holzheimer Deceased   Father Carliss Derby Alive   Sister  Alive   Mat Aunt  Alive   Mat Uncle  Alive   Pat Aunt  Alive   Bruna Brigham  Alive   MGM  Deceased   MGF  Deceased   PGM Ronnika Collett Alive   PGF Burnard Derby Alive  No partnership data on file   Family History  Problem Relation Age of Onset   Pulmonary embolism Mother    Factor V Leiden deficiency Mother    Diabetes Mother    Hypertension Father  Hypertension Maternal Grandfather    Hypertension Paternal Grandmother    Hypertension Paternal Grandfather    Social History   Socioeconomic History   Marital status: Married    Spouse name: Not on file   Number of children: Not on file   Years of education: Not on file   Highest education level: Some college, no degree  Occupational History   Not on file  Tobacco Use   Smoking status: Never    Passive exposure: Never   Smokeless tobacco: Never  Vaping Use   Vaping status: Never Used  Substance and Sexual Activity   Alcohol use: No   Drug use: No   Sexual activity: Not Currently     Partners: Male    Birth control/protection: None  Other Topics Concern   Not on file  Social History Narrative   Not on file   Social Drivers of Health   Financial Resource Strain: Low Risk  (07/11/2024)   Overall Financial Resource Strain (CARDIA)    Difficulty of Paying Living Expenses: Not very hard  Food Insecurity: No Food Insecurity (07/11/2024)   Hunger Vital Sign    Worried About Running Out of Food in the Last Year: Never true    Ran Out of Food in the Last Year: Never true  Transportation Needs: No Transportation Needs (07/11/2024)   PRAPARE - Administrator, Civil Service (Medical): No    Lack of Transportation (Non-Medical): No  Physical Activity: Insufficiently Active (07/11/2024)   Exercise Vital Sign    Days of Exercise per Week: 2 days    Minutes of Exercise per Session: 30 min  Stress: No Stress Concern Present (07/11/2024)   [employer] of Occupational Health - Occupational Stress Questionnaire    Feeling of Stress: Only a little  Social Connections: Socially Integrated (07/11/2024)   Social Connection and Isolation Panel    Frequency of Communication with Friends and Family: More than three times a week    Frequency of Social Gatherings with Friends and Family: Twice a week    Attends Religious Services: More than 4 times per year    Active Member of [employer] or Organizations: Yes    Attends Engineer, structural: More than 4 times per year    Marital Status: Married   Outpatient Medications Prior to Visit  Medication Sig   amLODipine  (NORVASC ) 10 MG tablet TAKE 1 TABLET BY MOUTH EVERY DAY   labetalol  (NORMODYNE ) 100 MG tablet Take 1 tablet (100 mg total) by mouth 2 (two) times daily.   [DISCONTINUED] celecoxib  (CELEBREX ) 200 MG capsule Take 1 capsule (200 mg total) by mouth 2 (two) times daily as needed. (Patient not taking: Reported on 06/22/2024)   [DISCONTINUED] letrozole (FEMARA) 2.5 MG tablet Take 2.5 mg by mouth daily.    [DISCONTINUED] phentermine (ADIPEX-P) 37.5 MG tablet Take 37.5 mg by mouth daily.   [DISCONTINUED] tirzepatide  (MOUNJARO ) 2.5 MG/0.5ML Pen Inject 2.5 mg into the skin once a week. (Patient not taking: Reported on 07/06/2024)   Facility-Administered Medications Prior to Visit  Medication Dose Route Frequency Provider   acetaminophen  (TYLENOL ) tablet 650 mg  650 mg Oral Once Norleen Lynwood ORN, MD   diphenhydrAMINE  (BENADRYL ) capsule 25 mg  25 mg Oral Once Norleen Lynwood ORN, MD   iron  sucrose (VENOFER ) injection 200 mg  200 mg Intravenous Once Norleen Lynwood ORN, MD   No Known Allergies  Immunization History  Administered Date(s) Administered   DTaP 11/24/1997, 01/23/1998, 03/27/1998, 12/11/1998   HIB (  PRP-OMP) 11/24/1997, 01/23/1998, 03/27/1998, 10/03/1998   HPV Quadrivalent 12/28/2015   Hep B, Unspecified 02/13/1998, 11/24/1997, 03/27/1998   Hepatitis A, Ped/Adol-2 Dose 11/10/2015   IPV 11/24/1997, 01/23/1998, 03/27/1998, 11/10/2015   Influenza Nasal 07/18/2008   Influenza,inj,Quad PF,6+ Mos 08/13/2022   Influenza,inj,quad, With Preservative 11/10/2015   Influenza-Unspecified 10/31/2020   MMR 10/03/1998, 11/11/2001   Meningococcal Conjugate 01/18/2016   PFIZER(Purple Top)SARS-COV-2 Vaccination 03/30/2020, 04/27/2020   PPD Test 10/02/2021   Tdap 06/13/2009, 03/01/2021   Varicella 10/03/1998, 11/10/2015    Health Maintenance  Topic Date Due   HPV VACCINES (2 - 3-dose series) 01/25/2016   COVID-19 Vaccine (3 - Pfizer risk series) 05/25/2020   Influenza Vaccine  04/30/2024   Cervical Cancer Screening (Pap smear)  10/19/2026   DTaP/Tdap/Td (7 - Td or Tdap) 03/02/2031   Hepatitis C Screening  Completed   HIV Screening  Completed   Pneumococcal Vaccine  Aged Out   Meningococcal B Vaccine  Aged Out    Patient Care Team: Pearson Picou, PA-C as PCP - General (Physician Assistant)  Review of Systems  All other systems reviewed and are negative.  Except see HPI       Objective    BP  (!) 128/90   Pulse 79   Resp 16   Ht 5' 3 (1.6 m)   Wt 277 lb 1.6 oz (125.7 kg)   LMP 06/07/2024 (Exact Date)   SpO2 99%   BMI 49.09 kg/m     Physical Exam Vitals reviewed.  Constitutional:      General: She is not in acute distress.    Appearance: Normal appearance. She is well-developed. She is obese. She is not diaphoretic.  HENT:     Head: Normocephalic and atraumatic.  Eyes:     General: No scleral icterus.    Conjunctiva/sclera: Conjunctivae normal.  Neck:     Thyroid : No thyromegaly.  Cardiovascular:     Rate and Rhythm: Normal rate and regular rhythm.     Pulses: Normal pulses.     Heart sounds: Normal heart sounds. No murmur heard. Pulmonary:     Effort: Pulmonary effort is normal. No respiratory distress.     Breath sounds: Normal breath sounds. No wheezing, rhonchi or rales.  Musculoskeletal:     Cervical back: Neck supple.     Right lower leg: No edema.     Left lower leg: No edema.  Lymphadenopathy:     Cervical: No cervical adenopathy.  Skin:    General: Skin is warm and dry.     Findings: No rash.  Neurological:     Mental Status: She is alert and oriented to person, place, and time. Mental status is at baseline.  Psychiatric:        Mood and Affect: Mood normal.        Behavior: Behavior normal.     Depression Screen    07/12/2024    9:05 AM 12/11/2023    1:39 PM 10/29/2023    1:23 PM 10/23/2023    8:59 AM  PHQ 2/9 Scores  PHQ - 2 Score 0 0 0 0   No results found for any visits on 07/12/24.  Assessment & Plan      Assessment & Plan Pregnancy, first trimester Confirmed pregnancy at four weeks. No concerning symptoms. History of miscarriage requires close monitoring. - Contact OBGYN for earlier appointment and be placed on cancellation list. - Order initial blood work for pregnancy and chronic conditions monitoring. - Perform beta-hCG test.  Essential hypertension  in pregnancy Chronic hypertension managed with amlodipine  10 and  labetalol  100. Labetalol  preferred in pregnancy. Previous pregnancy required switch to nifedipine . - Continue amlodipine  and labetalol  for now. - Discuss potential medication adjustments with OBGYN.  Polycystic ovarian syndrome PCOS with concerns about weight gain.  Obstructive sleep apnea Managed with CPAP, effectively reducing blood pressure. - Continue using CPAP machine as prescribed.  Elevated factor VIII Elevated factor VIII without acute issues.  Chronic neck pain Possibly related to sleeping position. No acute changes or severe symptoms. - Advise on proper sleeping positions and pillow support.  Encounter to establish care Welcomed to our clinic Reviewed past medical hx, social hx, family hx and surgical hx Pt advised to send all vaccination records or screening  Primary hypertension (Primary)  - Comprehensive metabolic panel with GFR - Hemoglobin A1c - Lipid Panel With LDL/HDL Ratio - CBC with Differential/Platelet  Less than [redacted] weeks gestation of pregnancy  - Beta HCG, Quant  Morbid obesity (HCC)  - TSH - T4, free  Hx of iron  def anemia Cbc Will follow-up  No follow-ups on file.    The patient was advised to call back or seek an in-person evaluation if the symptoms worsen or if the condition fails to improve as anticipated.  I discussed the assessment and treatment plan with the patient. The patient was provided an opportunity to ask questions and all were answered. The patient agreed with the plan and demonstrated an understanding of the instructions.  I, Shawndell Schillaci, PA-C have reviewed all documentation for this visit. The documentation on  07/12/2024   for the exam, diagnosis, procedures, and orders are all accurate and complete.  Jolynn Spencer, [facility] MMS [facility] [place] [phone] (phone) [phone] (fax)  [facility]

## 2024-07-12 NOTE — Progress Notes (Unsigned)
 Care Guide Pharmacy Note  07/12/2024 Name: Bonnie Long MRN: [id number] DOB: Jun 07, 1996  Referred By: Ostwalt, Janna, PA-C Reason for referral: Complex Care Management and Call Attempt #1 (Unsuccessful initial outreach to schedule with PHARM D- Allyson)   Bonnie Long is a 28 y.o. year old female who is a primary care patient of Ostwalt, Janna, PA-C.  Bonnie Long was referred to the pharmacist for assistance related to: Obstetrical Needs- Check BP  An unsuccessful telephone outreach was attempted today to contact the patient who was referred to the pharmacy team for assistance with BP check. Additional attempts will be made to contact the patient.  Bonnie Long [facility], [facility] Guide  Direct Dial: [phone]  Fax [phone]

## 2024-07-13 ENCOUNTER — Encounter: Payer: Self-pay | Admitting: Physician Assistant

## 2024-07-13 LAB — CBC WITH DIFFERENTIAL/PLATELET
Basophils Absolute: 0 x10E3/uL (ref 0.0–0.2)
Basos: 0 %
EOS (ABSOLUTE): 0.1 x10E3/uL (ref 0.0–0.4)
Eos: 1 %
Hematocrit: 39 % (ref 34.0–46.6)
Hemoglobin: 12.2 g/dL (ref 11.1–15.9)
Immature Grans (Abs): 0 x10E3/uL (ref 0.0–0.1)
Immature Granulocytes: 0 %
Lymphocytes Absolute: 1.8 x10E3/uL (ref 0.7–3.1)
Lymphs: 25 %
MCH: 26.5 pg — ABNORMAL LOW (ref 26.6–33.0)
MCHC: 31.3 g/dL — ABNORMAL LOW (ref 31.5–35.7)
MCV: 85 fL (ref 79–97)
Monocytes Absolute: 0.6 x10E3/uL (ref 0.1–0.9)
Monocytes: 8 %
Neutrophils Absolute: 4.6 x10E3/uL (ref 1.4–7.0)
Neutrophils: 66 %
Platelets: 339 x10E3/uL (ref 150–450)
RBC: 4.6 x10E6/uL (ref 3.77–5.28)
RDW: 13.2 % (ref 11.7–15.4)
WBC: 7.1 x10E3/uL (ref 3.4–10.8)

## 2024-07-13 LAB — COMPREHENSIVE METABOLIC PANEL WITH GFR
ALT: 14 IU/L (ref 0–32)
AST: 13 IU/L (ref 0–40)
Albumin: 4.3 g/dL (ref 4.0–5.0)
Alkaline Phosphatase: 54 IU/L (ref 41–116)
BUN/Creatinine Ratio: 8 — ABNORMAL LOW (ref 9–23)
BUN: 9 mg/dL (ref 6–20)
Bilirubin Total: 0.5 mg/dL (ref 0.0–1.2)
CO2: 21 mmol/L (ref 20–29)
Calcium: 9.4 mg/dL (ref 8.7–10.2)
Chloride: 103 mmol/L (ref 96–106)
Creatinine, Ser: 1.14 mg/dL — ABNORMAL HIGH (ref 0.57–1.00)
Globulin, Total: 2.6 g/dL (ref 1.5–4.5)
Glucose: 102 mg/dL — ABNORMAL HIGH (ref 70–99)
Potassium: 4.7 mmol/L (ref 3.5–5.2)
Sodium: 138 mmol/L (ref 134–144)
Total Protein: 6.9 g/dL (ref 6.0–8.5)
eGFR: 68 mL/min/1.73 (ref >59)

## 2024-07-13 LAB — LIPID PANEL WITH LDL/HDL RATIO
Cholesterol, Total: 140 mg/dL (ref 100–199)
HDL: 38 mg/dL — ABNORMAL LOW (ref >39)
LDL Chol Calc (NIH): 84 mg/dL (ref 0–99)
LDL/HDL Ratio: 2.2 ratio (ref 0.0–3.2)
Triglycerides: 92 mg/dL (ref 0–149)
VLDL Cholesterol Cal: 18 mg/dL (ref 5–40)

## 2024-07-13 LAB — HEMOGLOBIN A1C
Est. average glucose Bld gHb Est-mCnc: 114 mg/dL
Hgb A1c MFr Bld: 5.6 % (ref 4.8–5.6)

## 2024-07-13 NOTE — Progress Notes (Signed)
 Care Guide Pharmacy Note  07/13/2024 Name: Bonnie Long MRN: [id number] DOB: [birth year]  Referred By: Ostwalt, Janna, PA-C Reason for referral: Complex Care Management, Call Attempt #1 (Unsuccessful initial outreach to schedule with PHARM [name]), Call Attempt #2, and Call Attempt #3 (Successful initial outreach scheduled with PHARM D Allyson )   [name] is a 28 y.o. year old female who is a primary care patient of Ostwalt, Janna, PA-C.  [name] was referred to the pharmacist for assistance related to: HTN  Successful contact was made with the patient to discuss pharmacy services including being ready for the pharmacist to call at least 5 minutes before the scheduled appointment time and to have medication bottles and any blood pressure readings ready for review. The patient agreed to meet with the pharmacist via In Office visit on (date/time).  [name] [facility], [facility] Guide  Direct Dial: [phone]  Fax [phone]

## 2024-07-15 ENCOUNTER — Ambulatory Visit

## 2024-07-15 ENCOUNTER — Other Ambulatory Visit

## 2024-07-15 ENCOUNTER — Telehealth: Payer: Self-pay

## 2024-07-15 NOTE — Progress Notes (Signed)
 Complex Care Management Care Guide Note  07/15/2024 Name: [name] MRN: [id number] DOB: January 12, 1996  Bonnie Long is a 28 y.o. year old female who is a primary care patient of Ostwalt, Janna, PA-C and is actively engaged with the care management team. I reached out to Bonnie Long by phone today to assist with re-scheduling  with the Pharmacist.  Follow up plan: Unsuccessful telephone outreach attempt made. A HIPAA compliant phone message was left for the patient providing contact information and requesting a return call.  [name] [facility], [facility] Guide  Direct Dial: [phone]  Fax [phone]

## 2024-07-15 NOTE — Progress Notes (Deleted)
   S:     No chief complaint on file.   Reason for visit: ? 28 y.o. female who presents for hypertension evaluation, education, and management. Pertinent PMH also includes pre-eclampsia, migraines, PCOS, anxiety, OSA.  Care Team: Primary Care Provider: Ostwalt, Janna, PA-C  At last visit, ***.   Today, patient arrives in *** spirits and presents without *** assistance. *** Denies dizziness, headache, blurred vision, swelling.   Patient reports hypertension was diagnosed in ***.    Current antihypertensives include: amlodipine  10 mg daily, labetalol  100 mg BID  Antihypertensives tried in the past include: nifedipine    Patient reports adherence to taking all medications as prescribed.  *** Patient denies adherence with medications, reports missing *** medications *** times per week, on average.  Patient reported dietary habits: Eats *** meals/day Breakfast: *** Lunch: *** Dinner: *** Snacks: *** Drinks: ***  Patient-reported exercise habits: ***   SMBP: BP cuff at home: {Blank single:19197::***,yes,no}    Reported home blood pressure readings: ***   BP medications taken today: {Blank single:19197::***,yes,no}       _______________________________________________  Objective    Physical Examination:  Vitals:  Wt Readings from Last 3 Encounters:  07/12/24 277 lb 1.6 oz (125.7 kg)  07/06/24 276 lb (125.2 kg)  06/22/24 272 lb (123.4 kg)   BP Readings from Last 3 Encounters:  07/12/24 (!) 128/90  07/06/24 100/80  06/22/24 129/84   Pulse Readings from Last 3 Encounters:  07/12/24 79  07/06/24 (!) 59  06/22/24 73     Labs:?     Chemistry   Metabolic Risk Considerations  Lab Results  Component Value Date   NA 138 07/12/2024   K 4.7 07/12/2024   CL 103 07/12/2024   CO2 21 07/12/2024   BUN 9 07/12/2024   CREATININE 1.14 (H) 07/12/2024   CALCIUM 9.4 07/12/2024   MG 4.7 (H) 05/07/2021   Lab Results  Component Value Date   HGBA1C 5.6  07/12/2024   GLUCOSE 102 (H) 07/12/2024   CREATININE 1.14 (H) 07/12/2024   CREATININE 1.17 03/25/2024   CREATININE 1.11 12/11/2023   GFRNONAA >60 09/04/2023   GFRNONAA 54 (L) 05/09/2021   GFRNONAA 55 (L) 05/08/2021    Lab Results  Component Value Date   CHOL 140 07/12/2024   HDL 38 (L) 07/12/2024   HDL 36.40 (L) 12/11/2023   HDL 44.30 10/07/2023   AST 13 07/12/2024   AST 18 03/25/2024   ALT 14 07/12/2024   ALT 14 03/25/2024     The ASCVD Risk score (Arnett DK, et al., 2019) failed to calculate for the following reasons:   The 2019 ASCVD risk score is only valid for ages 36 to 76  Assessment and Plan:   Hypertension diagnosed *** currently *** on current medications. BP goal < 130/80 *** mmHg. Medication adherence appears ***. Control is suboptimal due to ***.  -{Meds adjust:18428} ***.  -{Meds adjust:18428} ***.  -Patient educated on purpose, proper use, and potential adverse effects of ***.  -F/u labs ordered - *** -Counseled on lifestyle modifications for blood pressure control including reduced dietary sodium, increased exercise, adequate sleep. -Encouraged patient to check BP at home and bring log of readings to next visit. Counseled on proper use of home BP cuff.   {pharmacisttime:33368}  Follow-up:  Pharmacist on *** PCP clinic visit on 08/12/24 Cardiology visit on 07/16/24  Peyton CHARLENA Ferries, PharmD Clinical Pharmacist [facility] [phone]

## 2024-07-16 ENCOUNTER — Encounter: Payer: Self-pay | Admitting: Physician Assistant

## 2024-07-16 ENCOUNTER — Ambulatory Visit: Admitting: Cardiology

## 2024-07-16 ENCOUNTER — Encounter: Payer: Self-pay | Admitting: Cardiology

## 2024-07-16 VITALS — BP 130/90 | HR 72 | Ht 63.0 in | Wt 274.8 lb

## 2024-07-16 DIAGNOSIS — O10919 Unspecified pre-existing hypertension complicating pregnancy, unspecified trimester: Secondary | ICD-10-CM

## 2024-07-16 DIAGNOSIS — Z3A01 Less than 8 weeks gestation of pregnancy: Secondary | ICD-10-CM | POA: Diagnosis not present

## 2024-07-16 NOTE — Progress Notes (Unsigned)
 Cardio-Obstetrics Clinic  New Evaluation  Date:  07/17/2024   ID:  [name], DOB 1995-10-11, MRN [id number]  PCP:  Dineen Channel, PA-C   [facility] Providers Cardiologist:  None  Electrophysiologist:  None       Referring MD: Izell Harari, MD   Chief Complaint:   History of Present Illness:    Bonnie Long is a 28 y.o. female [G3P1011] who is being seen today for the evaluation of chronic hypertension in pregnancy at the request of Izell Harari, MD.     She was referred by Dr. Izell for management of her blood pressure during pregnancy.  She is five weeks pregnant with her second child. Her first child is three years old, and she had a miscarriage in November of last year.  Hypertension was diagnosed in high school, and she has been on amlodipine  since then. Due to her pregnancy, she started labetalol  100 mg. Home blood pressure readings average in the 120s/70s with labetalol , but today's reading was 134/90.  She experiences no shortness of breath or chest pain.  Prior CV Studies Reviewed: The following studies were reviewed today:   Past Medical History:  Diagnosis Date   Anemia 08/17/2023   Anxiety 02/25/2023   Have had it since becoming a mom   Blood transfusion without reported diagnosis 05/07/2021   Had csection on 05/06/21 and hemorrhaged. Had to have 2 blood infusions   Chronic hypertension with superimposed preeclampsia 05/03/2021   Elevated serum creatinine    GERD (gastroesophageal reflux disease) 09/2022   Had during pregnancy and still have it   Hypertension 2021   Migraine 04/10/2020   PCOS (polycystic ovarian syndrome) 12/30/2023   Prediabetes 10/08/2023   Pregnancy induced hypertension    Severe pre-eclampsia 05/06/2021   UTI (urinary tract infection)     Past Surgical History:  Procedure Laterality Date   CESAREAN SECTION N/A 05/06/2021   Procedure: CESAREAN SECTION;  Surgeon: Izell Harari, MD;  Location: [facility] LD  ORS;  Service: Obstetrics;  Laterality: N/A;   DILATION AND EVACUATION N/A 08/17/2023   Procedure: DILATATION AND EVACUATION;  Surgeon: Eveline Lynwood MATSU, MD;  Location: [facility] OR;  Service: Gynecology;  Laterality: N/A;      OB History     Gravida  3   Para  1   Term  1   Preterm  0   AB  1   Living  1      SAB  1   IAB  0   Ectopic  0   Multiple  0   Live Births  1        Obstetric Comments  Pre-e, failed induction with first, PPH             Current Medications: Current Meds  Medication Sig   amLODipine  (NORVASC ) 10 MG tablet TAKE 1 TABLET BY MOUTH EVERY DAY   labetalol  (NORMODYNE ) 100 MG tablet Take 1 tablet (100 mg total) by mouth 2 (two) times daily.     Allergies:   Patient has no known allergies.   Social History   Socioeconomic History   Marital status: Married    Spouse name: Not on file   Number of children: Not on file   Years of education: Not on file   Highest education level: Some college, no degree  Occupational History   Not on file  Tobacco Use   Smoking status: Never    Passive exposure: Never   Smokeless tobacco: Never  Vaping Use   Vaping status: Never Used  Substance and Sexual Activity   Alcohol use: No   Drug use: No   Sexual activity: Not Currently    Partners: Male    Birth control/protection: None  Other Topics Concern   Not on file  Social History Narrative   Not on file   Social Drivers of Health   Financial Resource Strain: Low Risk  (07/11/2024)   Overall Financial Resource Strain (CARDIA)    Difficulty of Paying Living Expenses: Not very hard  Food Insecurity: No Food Insecurity (07/11/2024)   Hunger Vital Sign    Worried About Running Out of Food in the Last Year: Never true    Ran Out of Food in the Last Year: Never true  Transportation Needs: No Transportation Needs (07/11/2024)   PRAPARE - Administrator, Civil Service (Medical): No    Lack of Transportation (Non-Medical): No  Physical  Activity: Insufficiently Active (07/11/2024)   Exercise Vital Sign    Days of Exercise per Week: 2 days    Minutes of Exercise per Session: 30 min  Stress: No Stress Concern Present (07/11/2024)   [employer] of Occupational Health - Occupational Stress Questionnaire    Feeling of Stress: Only a little  Social Connections: Socially Integrated (07/11/2024)   Social Connection and Isolation Panel    Frequency of Communication with Friends and Family: More than three times a week    Frequency of Social Gatherings with Friends and Family: Twice a week    Attends Religious Services: More than 4 times per year    Active Member of [employer] or Organizations: Yes    Attends Engineer, structural: More than 4 times per year    Marital Status: Married      Family History  Problem Relation Age of Onset   Pulmonary embolism Mother    Factor V Leiden deficiency Mother    Diabetes Mother    Hypertension Father    Hypertension Maternal Grandfather    Hypertension Paternal Grandmother    Hypertension Paternal Grandfather       ROS:   Please see the history of present illness.     All other systems reviewed and are negative.   Labs/EKG Reviewed:    EKG:  None today   Recent Labs: 12/11/2023: TSH 2.45 07/12/2024: ALT 14; BUN 9; Creatinine, Ser 1.14; Hemoglobin 12.2; Platelets 339; Potassium 4.7; Sodium 138   Recent Lipid Panel Lab Results  Component Value Date/Time   CHOL 140 07/12/2024 09:55 AM   TRIG 92 07/12/2024 09:55 AM   HDL 38 (L) 07/12/2024 09:55 AM   CHOLHDL 4 12/11/2023 02:27 PM   LDLCALC 84 07/12/2024 09:55 AM    Physical Exam:    VS:  BP (!) 130/90 (BP Location: Left Arm, Patient Position: Sitting, Cuff Size: Large)   Pulse 72   Ht 5' 3 (1.6 m)   Wt 274 lb 12.8 oz (124.6 kg)   LMP 06/07/2024 (Exact Date)   SpO2 97%   BMI 48.68 kg/m     Wt Readings from Last 3 Encounters:  07/16/24 274 lb 12.8 oz (124.6 kg)  07/12/24 277 lb 1.6 oz (125.7 kg)   07/06/24 276 lb (125.2 kg)     GEN:  Well nourished, well developed in no acute distress HEENT: Normal NECK: No JVD; No carotid bruits LYMPHATICS: No lymphadenopathy CARDIAC: RRR, no murmurs, rubs, gallops RESPIRATORY:  Clear to auscultation without rales, wheezing or rhonchi  ABDOMEN: Soft,  non-tender, non-distended MUSCULOSKELETAL:  No edema; No deformity  SKIN: Warm and dry NEUROLOGIC:  Alert and oriented x 3 PSYCHIATRIC:  Normal affect    Risk Assessment/Risk Calculators:     CARPREG II Risk Prediction Index Score:  1.  The patient's risk for a primary cardiac event is 5%.   Modified World Health Organization [facility]) Classification of Maternal CV Risk   Class I         ASSESSMENT & PLAN:    Chronic Hypertension on pregnancy  Chronic hypertension complicating first trimester pregnancy Current BP 134/90. No adjustment to avoid hypotension. Discussed importance of monitoring BP during pregnancy. - Continue labetalol  100 mg as prescribed. - Monitor blood pressure daily. - Report readings over 130/80 for two consecutive days via MyChart. - Educated on signs of concerning symptoms such as shortness of breath or persistent chest pain.  Current pregnancy, second gestation, first trimester Five weeks pregnant. Previous C-section at 37 weeks due to infection and failure to progress. Plan to start aspirin  to reduce preeclampsia risk. - Schedule follow-up with pharmacist Chris Pavero in four weeks for blood pressure monitoring. - Start aspirin  between 8-10 weeks of gestation for preeclampsia prophylaxis - Continue prenatal care and monitoring.   Patient Instructions  Medication Instructions:   No changes  *If you need a refill on your cardiac medications before your next appointment, please call your pharmacy*   Lab Work: Not needed    Testing/Procedures: Not needed   Follow-Up: At [facility], you and your health needs are our priority.  As part of our  continuing mission to provide you with exceptional heart care, we have created designated Provider Care Teams.  These Care Teams include your primary Cardiologist (physician) and Advanced Practice Providers (APPs -  Physician Assistants and Nurse Practitioners) who all work together to provide you with the care you need, when you need it.     Your next appointment:   4 week(s)  The format for your next appointment:   In Person  Provider:   Medford Bolk [facility]   You will Follow up With Dr Sheena after your visit  with the [facility]- pharmacists    Dispo:  Return in about 10 weeks (around 09/24/2024).   Medication Adjustments/Labs and Tests Ordered: Current medicines are reviewed at length with the patient today.  Concerns regarding medicines are outlined above.  Tests Ordered: No orders of the defined types were placed in this encounter.  Medication Changes: No orders of the defined types were placed in this encounter.

## 2024-07-16 NOTE — Patient Instructions (Addendum)
 Medication Instructions:   No changes  *If you need a refill on your cardiac medications before your next appointment, please call your pharmacy*   Lab Work: Not needed    Testing/Procedures: Not needed   Follow-Up: At [facility], you and your health needs are our priority.  As part of our continuing mission to provide you with exceptional heart care, we have created designated Provider Care Teams.  These Care Teams include your primary Cardiologist (physician) and Advanced Practice Providers (APPs -  Physician Assistants and Nurse Practitioners) who all work together to provide you with the care you need, when you need it.     Your next appointment:   4 week(s)  The format for your next appointment:   In Person  Provider:   Medford Bolk [facility]   You will Follow up With Dr Sheena after your visit  with the [facility]- pharmacists

## 2024-07-17 LAB — SPECIMEN STATUS REPORT

## 2024-07-17 LAB — BETA HCG QUANT (REF LAB): hCG Quant: 1135 m[IU]/mL

## 2024-07-21 ENCOUNTER — Ambulatory Visit: Payer: Self-pay | Admitting: Physician Assistant

## 2024-08-12 ENCOUNTER — Ambulatory Visit: Admitting: Physician Assistant

## 2024-08-13 ENCOUNTER — Ambulatory Visit: Admitting: Pharmacist

## 2024-08-13 ENCOUNTER — Ambulatory Visit: Admitting: Cardiology

## 2024-08-18 DIAGNOSIS — O34219 Maternal care for unspecified type scar from previous cesarean delivery: Secondary | ICD-10-CM | POA: Insufficient documentation

## 2024-08-18 DIAGNOSIS — Z8759 Personal history of other complications of pregnancy, childbirth and the puerperium: Secondary | ICD-10-CM | POA: Insufficient documentation

## 2024-08-18 DIAGNOSIS — Z3491 Encounter for supervision of normal pregnancy, unspecified, first trimester: Secondary | ICD-10-CM | POA: Insufficient documentation

## 2024-08-18 DIAGNOSIS — O161 Unspecified maternal hypertension, first trimester: Secondary | ICD-10-CM | POA: Insufficient documentation

## 2024-08-18 DIAGNOSIS — O09291 Supervision of pregnancy with other poor reproductive or obstetric history, first trimester: Secondary | ICD-10-CM | POA: Insufficient documentation

## 2024-08-19 ENCOUNTER — Encounter: Payer: Self-pay | Admitting: Pharmacist

## 2024-08-29 ENCOUNTER — Other Ambulatory Visit: Payer: Self-pay | Admitting: Internal Medicine

## 2024-08-29 DIAGNOSIS — I10 Essential (primary) hypertension: Secondary | ICD-10-CM

## 2024-08-30 ENCOUNTER — Encounter (HOSPITAL_COMMUNITY): Payer: Self-pay | Admitting: Obstetrics and Gynecology

## 2024-08-30 ENCOUNTER — Encounter: Payer: Self-pay | Admitting: Pharmacist

## 2024-08-30 ENCOUNTER — Inpatient Hospital Stay (HOSPITAL_COMMUNITY)
Admission: AD | Admit: 2024-08-30 | Discharge: 2024-08-30 | Disposition: A | Discharge status: Home / Self Care | Payer: Self-pay | Attending: Obstetrics and Gynecology | Admitting: Obstetrics and Gynecology

## 2024-08-30 ENCOUNTER — Encounter: Payer: Self-pay | Admitting: Cardiology

## 2024-08-30 DIAGNOSIS — O26891 Other specified pregnancy related conditions, first trimester: Secondary | ICD-10-CM

## 2024-08-30 DIAGNOSIS — B9689 Other specified bacterial agents as the cause of diseases classified elsewhere: Secondary | ICD-10-CM

## 2024-08-30 DIAGNOSIS — Z6841 Body Mass Index (BMI) 40.0 and over, adult: Secondary | ICD-10-CM

## 2024-08-30 DIAGNOSIS — Z3A11 11 weeks gestation of pregnancy: Secondary | ICD-10-CM

## 2024-08-30 DIAGNOSIS — N760 Acute vaginitis: Secondary | ICD-10-CM

## 2024-08-30 DIAGNOSIS — R102 Pelvic and perineal pain unspecified side: Secondary | ICD-10-CM

## 2024-08-30 DIAGNOSIS — O10919 Unspecified pre-existing hypertension complicating pregnancy, unspecified trimester: Secondary | ICD-10-CM

## 2024-08-30 DIAGNOSIS — O23591 Infection of other part of genital tract in pregnancy, first trimester: Secondary | ICD-10-CM | POA: Insufficient documentation

## 2024-08-30 LAB — WET PREP, GENITAL
Sperm: NONE SEEN
Trich, Wet Prep: NONE SEEN
WBC, Wet Prep HPF POC: >=10 — AB (ref <10)
Yeast Wet Prep HPF POC: NONE SEEN

## 2024-08-30 LAB — URINALYSIS, ROUTINE W REFLEX MICROSCOPIC
Bilirubin Urine: NEGATIVE
Glucose, UA: NEGATIVE mg/dL
Hgb urine dipstick: NEGATIVE
Ketones, ur: NEGATIVE mg/dL
Leukocytes,Ua: NEGATIVE
Nitrite: NEGATIVE
Protein, ur: NEGATIVE mg/dL
Specific Gravity, Urine: 1.019 (ref 1.005–1.030)
pH: 5 (ref 5.0–8.0)

## 2024-08-30 MED ORDER — METRONIDAZOLE 500 MG PO TABS: 500.0000 mg | ORAL_TABLET | Freq: Two times a day (BID) | ORAL | 0 refills | Status: AC

## 2024-08-30 NOTE — MAU Provider Note (Signed)
 S [name] is a 28 y.o. [phone] patient who presents to MAU today with complaint of patient reporting pelvic pain and states that she had elevated blood pressure.  She reports the pelvic pain began about 2 to 3 days ago and she reports some dysuria.  But denies any urinary burning, frequency but was concerned she might have a UTI.  She also reported elevated blood pressure at home of 141/99 had recently been seen by her primary OB at [facility] and they switched her antihypertensive to labetalol  200 mg twice daily her last dose was at 1850.  She has a history of chronic hypertension.  She denies any signs or symptoms of preeclampsia.    Offers no other obstetrical complaints at this time and has her follow-up scheduled at [facility] for routine prenatal care.   The remainder of the ROS is negative unless otherwise noted in HPI above   O BP 127/80 (BP Location: Right Arm)   Pulse 77   Temp 99.1 F (37.3 C) (Oral)   Resp 16   Ht 5' 3 (1.6 m)   Wt 120.2 kg   LMP 06/07/2024 (Exact Date)   SpO2 100%   BMI 46.94 kg/m  Physical Exam Vitals and nursing note reviewed.  Constitutional:      General: She is not in acute distress.    Appearance: Normal appearance. She is obese. She is not ill-appearing.  HENT:     Head: Normocephalic.  Cardiovascular:     Rate and Rhythm: Normal rate.  Pulmonary:     Effort: Pulmonary effort is normal.  Abdominal:     Palpations: Abdomen is soft.  Musculoskeletal:        General: Normal range of motion.     Cervical back: Normal range of motion.  Skin:    General: Skin is warm.  Neurological:     Mental Status: She is oriented to person, place, and time.  Psychiatric:        Behavior: Behavior normal.     FHR via Ultrasound d/t increased body habitus   Pt informed that the ultrasound is considered a limited OB ultrasound and is not intended to be a complete ultrasound exam.  Patient also informed that the ultrasound is not being  completed with the intent of assessing for fetal or placental anomalies or any pelvic abnormalities.  Explained that the purpose of today's ultrasound is to assess for  FHR .  Patient acknowledges the purpose of the exam and the limitations of the study.   FHR at 164 bpm ( See Media images)   MDM  MODERATE   Available prenatal records reviewed from care everywhere Physical  exam performed UA: No evidence of UTI Wet prep: C:/W Clue cells ( Plan for RX Flagyl 500 mg BID x 7 ) Vaginal cultures: GC pending at discharge Patient with a history of chronic hypertension and is on labetalol  200 mg twice daily.  BP here normotensive 127/80    Orders Placed This Encounter  Procedures   Wet prep, genital    Standing Status:   Standing    Number of Occurrences:   1   Urinalysis, Routine w reflex microscopic -Urine, Clean Catch    Standing Status:   Standing    Number of Occurrences:   1    Specimen Source:   Urine, Clean Catch [76]   Discharge patient Discharge disposition: 01-Home or Self Care; Discharge patient date: 08/30/2024    Standing Status:  Standing    Number of Occurrences:   1    Discharge disposition:   01-Home or Self Care [1]    Discharge patient date:   08/30/2024      Results for orders placed or performed during the hospital encounter of 08/30/24 (from the past 24 hours)  Wet prep, genital     Status: Abnormal   Collection Time: 08/30/24  9:58 PM  Result Value Ref Range   Yeast Wet Prep HPF POC NONE SEEN NONE SEEN   Trich, Wet Prep NONE SEEN NONE SEEN   Clue Cells Wet Prep HPF POC PRESENT (A) NONE SEEN   WBC, Wet Prep HPF POC >=10 (A) <10   Sperm NONE SEEN   Urinalysis, Routine w reflex microscopic -Urine, Clean Catch     Status: None   Collection Time: 08/30/24 10:03 PM  Result Value Ref Range   Color, Urine YELLOW YELLOW   APPearance CLEAR CLEAR   Specific Gravity, Urine 1.019 1.005 - 1.030   pH 5.0 5.0 - 8.0   Glucose, UA NEGATIVE NEGATIVE mg/dL   Hgb urine  dipstick NEGATIVE NEGATIVE   Bilirubin Urine NEGATIVE NEGATIVE   Ketones, ur NEGATIVE NEGATIVE mg/dL   Protein, ur NEGATIVE NEGATIVE mg/dL   Nitrite NEGATIVE NEGATIVE   Leukocytes,Ua NEGATIVE NEGATIVE     I have reviewed the patient chart and performed the physical exam . I have ordered & interpreted the lab results and reviewed them with the patient  Medications ordered as stated below.  A/P as described below.  Counseling and education provided and patient agreeable  with plan as described below. Verbalized understanding.    ASSESSMENT Medical screening exam complete  Bacterial vaginosis  Pelvic pain affecting pregnancy in first trimester, antepartum  [redacted] weeks gestation of pregnancy    PLAN Future Appointments  Date Time Provider Department Center  09/03/2024  3:30 PM Darrell Bruckner, [facility] None    Discharge from MAU in stable condition  F/U as scheduled with [facility] for Routine [facility]  See AVS for full description of educational information and instructions provided to the patient at time of discharge   Warning signs for worsening condition that would warrant emergency follow-up discussed Patient may return to MAU as needed   Littie Olam LABOR, NP 08/30/2024 10:40 PM   This chart was dictated using voice recognition software, Dragon. Despite the best efforts of this provider to proofread and correct errors, errors may still occur which can change documentation meaning.

## 2024-08-30 NOTE — MAU Note (Addendum)
..  Kierstin MAKESHIA SEAT is a 28 y.o. at Unknown here in MAU reporting:  Today is here for pelvic pain and elevated blood pressure. Pelvic pain began 2-3 days ago,the pain is relieved after voiding for a minute. Wants to make sure she doesn't have a UTI Had elevated blood pressure at home of 141/99, was taking amlodipine  in AM and labetalol  BID, was taken off amlodipine  and started on 200 mg of labetalol  BID. Last labetalol  dose was at 1850. Denies PIH symptoms.  Has received prenatal care at [facility] but is in town for [employer]. Has had first Ultrasound and [facility] results with [facility].   EDD: 03/17/2025 Pain score: 5/10 Vitals:   08/30/24 2126  BP: 127/80  Pulse: 77  Resp: 16  Temp: 99.1 F (37.3 C)  SpO2: 100%     QYU:lwjaoz to doppler in triage Lab orders placed from triage:  UA

## 2024-08-31 LAB — GC/CHLAMYDIA PROBE AMP (~~LOC~~) NOT AT ARMC
Chlamydia: NEGATIVE
Comment: NEGATIVE
Comment: NORMAL
Neisseria Gonorrhea: NEGATIVE

## 2024-09-01 ENCOUNTER — Encounter: Payer: Self-pay | Admitting: Sleep Medicine

## 2024-09-01 MED ORDER — NIFEDIPINE ER OSMOTIC RELEASE 30 MG PO TB24: 30.0000 mg | ORAL_TABLET | Freq: Every day | ORAL | 2 refills | Status: AC

## 2024-09-01 NOTE — Telephone Encounter (Signed)
 Patient sent another message regarding same issue to PharmD team and this has been addressed.

## 2024-09-03 ENCOUNTER — Ambulatory Visit: Admitting: Pharmacist
